# Patient Record
Sex: Male | Born: 1945 | Race: White | Hispanic: No | Smoking: Former smoker
Health system: Southern US, Community
[De-identification: ages and names within clinical notes are randomized; demographics above are authoritative.]

## PROBLEM LIST (undated history)

## (undated) DIAGNOSIS — I4891 Unspecified atrial fibrillation: Secondary | ICD-10-CM

## (undated) DIAGNOSIS — I10 Essential (primary) hypertension: Secondary | ICD-10-CM

## (undated) DIAGNOSIS — M2550 Pain in unspecified joint: Secondary | ICD-10-CM

## (undated) DIAGNOSIS — Z7901 Long term (current) use of anticoagulants: Secondary | ICD-10-CM

## (undated) DIAGNOSIS — K219 Gastro-esophageal reflux disease without esophagitis: Secondary | ICD-10-CM

## (undated) DIAGNOSIS — Z923 Personal history of irradiation: Secondary | ICD-10-CM

## (undated) DIAGNOSIS — E079 Disorder of thyroid, unspecified: Secondary | ICD-10-CM

## (undated) DIAGNOSIS — C801 Malignant (primary) neoplasm, unspecified: Secondary | ICD-10-CM

## (undated) HISTORY — PX: TONSILLECTOMY: SUR1361

## (undated) HISTORY — PX: OTHER SURGICAL HISTORY: SHX169

## (undated) HISTORY — DX: Disorder of thyroid, unspecified: E07.9

## (undated) HISTORY — DX: Long term (current) use of anticoagulants: Z79.01

## (undated) HISTORY — DX: Unspecified atrial fibrillation: I48.91

## (undated) HISTORY — DX: Gastro-esophageal reflux disease without esophagitis: K21.9

---
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
## 2001-03-10 ENCOUNTER — Ambulatory Visit (HOSPITAL_BASED_OUTPATIENT_CLINIC_OR_DEPARTMENT_OTHER): Admission: RE | Admit: 2001-03-10 | Discharge: 2001-03-10 | Payer: Self-pay

## 2002-03-08 HISTORY — PX: HEMORROIDECTOMY: SUR656

## 2003-06-27 ENCOUNTER — Encounter: Admission: RE | Admit: 2003-06-27 | Discharge: 2003-06-27 | Payer: Self-pay | Admitting: Family Medicine

## 2003-06-28 ENCOUNTER — Ambulatory Visit (HOSPITAL_COMMUNITY): Admission: RE | Admit: 2003-06-28 | Discharge: 2003-06-28 | Payer: Self-pay | Admitting: Urology

## 2011-06-23 ENCOUNTER — Ambulatory Visit (INDEPENDENT_AMBULATORY_CARE_PROVIDER_SITE_OTHER): Payer: Self-pay | Admitting: General Surgery

## 2013-05-02 DIAGNOSIS — L03119 Cellulitis of unspecified part of limb: Secondary | ICD-10-CM | POA: Diagnosis not present

## 2013-05-02 DIAGNOSIS — I10 Essential (primary) hypertension: Secondary | ICD-10-CM | POA: Diagnosis not present

## 2013-05-02 DIAGNOSIS — R609 Edema, unspecified: Secondary | ICD-10-CM | POA: Diagnosis not present

## 2013-05-02 DIAGNOSIS — M79609 Pain in unspecified limb: Secondary | ICD-10-CM | POA: Diagnosis not present

## 2013-05-02 DIAGNOSIS — I83893 Varicose veins of bilateral lower extremities with other complications: Secondary | ICD-10-CM | POA: Diagnosis not present

## 2013-05-02 DIAGNOSIS — Z79899 Other long term (current) drug therapy: Secondary | ICD-10-CM | POA: Diagnosis not present

## 2013-05-02 DIAGNOSIS — L02419 Cutaneous abscess of limb, unspecified: Secondary | ICD-10-CM | POA: Diagnosis not present

## 2013-05-16 DIAGNOSIS — I8310 Varicose veins of unspecified lower extremity with inflammation: Secondary | ICD-10-CM | POA: Diagnosis not present

## 2013-05-16 DIAGNOSIS — R0609 Other forms of dyspnea: Secondary | ICD-10-CM | POA: Diagnosis not present

## 2013-05-16 DIAGNOSIS — F3289 Other specified depressive episodes: Secondary | ICD-10-CM | POA: Diagnosis not present

## 2013-05-16 DIAGNOSIS — I10 Essential (primary) hypertension: Secondary | ICD-10-CM | POA: Diagnosis not present

## 2013-05-16 DIAGNOSIS — F329 Major depressive disorder, single episode, unspecified: Secondary | ICD-10-CM | POA: Diagnosis not present

## 2013-05-16 DIAGNOSIS — Z23 Encounter for immunization: Secondary | ICD-10-CM | POA: Diagnosis not present

## 2013-05-16 DIAGNOSIS — R0989 Other specified symptoms and signs involving the circulatory and respiratory systems: Secondary | ICD-10-CM | POA: Diagnosis not present

## 2013-06-25 DIAGNOSIS — J209 Acute bronchitis, unspecified: Secondary | ICD-10-CM | POA: Diagnosis not present

## 2013-06-27 DIAGNOSIS — A499 Bacterial infection, unspecified: Secondary | ICD-10-CM | POA: Diagnosis not present

## 2013-06-27 DIAGNOSIS — I8310 Varicose veins of unspecified lower extremity with inflammation: Secondary | ICD-10-CM | POA: Diagnosis not present

## 2013-06-27 DIAGNOSIS — B9689 Other specified bacterial agents as the cause of diseases classified elsewhere: Secondary | ICD-10-CM | POA: Diagnosis not present

## 2013-06-27 DIAGNOSIS — J209 Acute bronchitis, unspecified: Secondary | ICD-10-CM | POA: Diagnosis not present

## 2013-06-27 DIAGNOSIS — I10 Essential (primary) hypertension: Secondary | ICD-10-CM | POA: Diagnosis not present

## 2013-06-28 DIAGNOSIS — M7981 Nontraumatic hematoma of soft tissue: Secondary | ICD-10-CM | POA: Diagnosis not present

## 2013-06-28 DIAGNOSIS — R109 Unspecified abdominal pain: Secondary | ICD-10-CM | POA: Diagnosis not present

## 2013-06-28 DIAGNOSIS — K8020 Calculus of gallbladder without cholecystitis without obstruction: Secondary | ICD-10-CM | POA: Diagnosis not present

## 2013-06-28 DIAGNOSIS — S3981XA Other specified injuries of abdomen, initial encounter: Secondary | ICD-10-CM | POA: Diagnosis not present

## 2013-06-28 DIAGNOSIS — J209 Acute bronchitis, unspecified: Secondary | ICD-10-CM | POA: Diagnosis not present

## 2013-06-28 DIAGNOSIS — I10 Essential (primary) hypertension: Secondary | ICD-10-CM | POA: Diagnosis not present

## 2013-06-28 DIAGNOSIS — E876 Hypokalemia: Secondary | ICD-10-CM | POA: Diagnosis not present

## 2013-06-28 DIAGNOSIS — Z79899 Other long term (current) drug therapy: Secondary | ICD-10-CM | POA: Diagnosis not present

## 2013-06-28 DIAGNOSIS — S301XXA Contusion of abdominal wall, initial encounter: Secondary | ICD-10-CM | POA: Diagnosis not present

## 2013-07-03 DIAGNOSIS — A499 Bacterial infection, unspecified: Secondary | ICD-10-CM | POA: Diagnosis not present

## 2013-07-03 DIAGNOSIS — K5900 Constipation, unspecified: Secondary | ICD-10-CM | POA: Diagnosis not present

## 2013-07-03 DIAGNOSIS — B9689 Other specified bacterial agents as the cause of diseases classified elsewhere: Secondary | ICD-10-CM | POA: Diagnosis not present

## 2013-07-03 DIAGNOSIS — IMO0002 Reserved for concepts with insufficient information to code with codable children: Secondary | ICD-10-CM | POA: Diagnosis not present

## 2013-07-03 DIAGNOSIS — J209 Acute bronchitis, unspecified: Secondary | ICD-10-CM | POA: Diagnosis not present

## 2013-07-13 DIAGNOSIS — IMO0002 Reserved for concepts with insufficient information to code with codable children: Secondary | ICD-10-CM | POA: Diagnosis not present

## 2013-07-13 DIAGNOSIS — K5900 Constipation, unspecified: Secondary | ICD-10-CM | POA: Diagnosis not present

## 2013-07-13 DIAGNOSIS — I10 Essential (primary) hypertension: Secondary | ICD-10-CM | POA: Diagnosis not present

## 2013-07-13 DIAGNOSIS — J309 Allergic rhinitis, unspecified: Secondary | ICD-10-CM | POA: Diagnosis not present

## 2013-07-22 DIAGNOSIS — R059 Cough, unspecified: Secondary | ICD-10-CM | POA: Diagnosis not present

## 2013-07-22 DIAGNOSIS — J209 Acute bronchitis, unspecified: Secondary | ICD-10-CM | POA: Diagnosis not present

## 2013-11-21 DIAGNOSIS — I10 Essential (primary) hypertension: Secondary | ICD-10-CM | POA: Diagnosis not present

## 2013-11-21 DIAGNOSIS — Z780 Asymptomatic menopausal state: Secondary | ICD-10-CM | POA: Diagnosis not present

## 2013-11-21 DIAGNOSIS — Z125 Encounter for screening for malignant neoplasm of prostate: Secondary | ICD-10-CM | POA: Diagnosis not present

## 2013-11-21 DIAGNOSIS — I8310 Varicose veins of unspecified lower extremity with inflammation: Secondary | ICD-10-CM | POA: Diagnosis not present

## 2013-11-21 DIAGNOSIS — Z23 Encounter for immunization: Secondary | ICD-10-CM | POA: Diagnosis not present

## 2013-11-21 DIAGNOSIS — Z0000 Encounter for general adult medical examination without abnormal findings: Secondary | ICD-10-CM | POA: Diagnosis not present

## 2013-11-21 DIAGNOSIS — M949 Disorder of cartilage, unspecified: Secondary | ICD-10-CM | POA: Diagnosis not present

## 2013-11-21 DIAGNOSIS — J309 Allergic rhinitis, unspecified: Secondary | ICD-10-CM | POA: Diagnosis not present

## 2013-11-21 DIAGNOSIS — M899 Disorder of bone, unspecified: Secondary | ICD-10-CM | POA: Diagnosis not present

## 2014-06-04 DIAGNOSIS — Z23 Encounter for immunization: Secondary | ICD-10-CM | POA: Diagnosis not present

## 2014-06-04 DIAGNOSIS — F418 Other specified anxiety disorders: Secondary | ICD-10-CM | POA: Diagnosis not present

## 2014-06-04 DIAGNOSIS — I119 Hypertensive heart disease without heart failure: Secondary | ICD-10-CM | POA: Diagnosis not present

## 2014-06-04 DIAGNOSIS — J302 Other seasonal allergic rhinitis: Secondary | ICD-10-CM | POA: Diagnosis not present

## 2014-06-04 DIAGNOSIS — N401 Enlarged prostate with lower urinary tract symptoms: Secondary | ICD-10-CM | POA: Diagnosis not present

## 2014-07-11 DIAGNOSIS — Z125 Encounter for screening for malignant neoplasm of prostate: Secondary | ICD-10-CM | POA: Diagnosis not present

## 2014-07-11 DIAGNOSIS — R972 Elevated prostate specific antigen [PSA]: Secondary | ICD-10-CM | POA: Diagnosis not present

## 2014-07-11 DIAGNOSIS — I119 Hypertensive heart disease without heart failure: Secondary | ICD-10-CM | POA: Diagnosis not present

## 2014-07-11 DIAGNOSIS — E8881 Metabolic syndrome: Secondary | ICD-10-CM | POA: Diagnosis not present

## 2014-07-11 DIAGNOSIS — Z13220 Encounter for screening for lipoid disorders: Secondary | ICD-10-CM | POA: Diagnosis not present

## 2015-03-25 ENCOUNTER — Encounter (HOSPITAL_COMMUNITY): Payer: Self-pay | Admitting: Emergency Medicine

## 2015-03-25 ENCOUNTER — Emergency Department (HOSPITAL_COMMUNITY)
Admission: EM | Admit: 2015-03-25 | Discharge: 2015-03-25 | Disposition: A | Discharge status: Home / Self Care | Payer: Medicare Other | Attending: Emergency Medicine | Admitting: Emergency Medicine

## 2015-03-25 ENCOUNTER — Emergency Department (HOSPITAL_COMMUNITY): Payer: Medicare Other

## 2015-03-25 DIAGNOSIS — Z79899 Other long term (current) drug therapy: Secondary | ICD-10-CM | POA: Diagnosis not present

## 2015-03-25 DIAGNOSIS — Y998 Other external cause status: Secondary | ICD-10-CM | POA: Diagnosis not present

## 2015-03-25 DIAGNOSIS — W010XXA Fall on same level from slipping, tripping and stumbling without subsequent striking against object, initial encounter: Secondary | ICD-10-CM | POA: Insufficient documentation

## 2015-03-25 DIAGNOSIS — K625 Hemorrhage of anus and rectum: Secondary | ICD-10-CM | POA: Diagnosis not present

## 2015-03-25 DIAGNOSIS — K921 Melena: Secondary | ICD-10-CM

## 2015-03-25 DIAGNOSIS — Y9289 Other specified places as the place of occurrence of the external cause: Secondary | ICD-10-CM | POA: Insufficient documentation

## 2015-03-25 DIAGNOSIS — Y9389 Activity, other specified: Secondary | ICD-10-CM | POA: Diagnosis not present

## 2015-03-25 DIAGNOSIS — S29012A Strain of muscle and tendon of back wall of thorax, initial encounter: Secondary | ICD-10-CM | POA: Diagnosis not present

## 2015-03-25 DIAGNOSIS — M549 Dorsalgia, unspecified: Secondary | ICD-10-CM | POA: Diagnosis not present

## 2015-03-25 DIAGNOSIS — I10 Essential (primary) hypertension: Secondary | ICD-10-CM | POA: Insufficient documentation

## 2015-03-25 DIAGNOSIS — K649 Unspecified hemorrhoids: Secondary | ICD-10-CM | POA: Diagnosis not present

## 2015-03-25 DIAGNOSIS — S39012A Strain of muscle, fascia and tendon of lower back, initial encounter: Secondary | ICD-10-CM | POA: Diagnosis not present

## 2015-03-25 DIAGNOSIS — S3992XA Unspecified injury of lower back, initial encounter: Secondary | ICD-10-CM | POA: Diagnosis present

## 2015-03-25 DIAGNOSIS — K644 Residual hemorrhoidal skin tags: Secondary | ICD-10-CM | POA: Diagnosis not present

## 2015-03-25 DIAGNOSIS — R195 Other fecal abnormalities: Secondary | ICD-10-CM | POA: Diagnosis not present

## 2015-03-25 HISTORY — DX: Pain in unspecified joint: M25.50

## 2015-03-25 HISTORY — DX: Essential (primary) hypertension: I10

## 2015-03-25 LAB — CBC WITH DIFFERENTIAL/PLATELET
Basophils Absolute: 0 10*3/uL (ref 0.0–0.1)
Basophils Relative: 1 %
Eosinophils Absolute: 0.1 10*3/uL (ref 0.0–0.7)
Eosinophils Relative: 1 %
HCT: 47.8 % (ref 39.0–52.0)
Hemoglobin: 17 g/dL (ref 13.0–17.0)
Lymphocytes Relative: 20 %
Lymphs Abs: 1.2 10*3/uL (ref 0.7–4.0)
MCH: 29.6 pg (ref 26.0–34.0)
MCHC: 35.6 g/dL (ref 30.0–36.0)
MCV: 83.1 fL (ref 78.0–100.0)
Monocytes Absolute: 0.4 10*3/uL (ref 0.1–1.0)
Monocytes Relative: 6 %
Neutro Abs: 4.5 10*3/uL (ref 1.7–7.7)
Neutrophils Relative %: 72 %
Platelets: 200 10*3/uL (ref 150–400)
RBC: 5.75 MIL/uL (ref 4.22–5.81)
RDW: 13.5 % (ref 11.5–15.5)
WBC: 6.2 10*3/uL (ref 4.0–10.5)

## 2015-03-25 LAB — COMPREHENSIVE METABOLIC PANEL
ALT: 11 U/L — ABNORMAL LOW (ref 17–63)
AST: 21 U/L (ref 15–41)
Albumin: 4.8 g/dL (ref 3.5–5.0)
Alkaline Phosphatase: 36 U/L — ABNORMAL LOW (ref 38–126)
Anion gap: 12 (ref 5–15)
BUN: 15 mg/dL (ref 6–20)
CO2: 20 mmol/L — ABNORMAL LOW (ref 22–32)
Calcium: 9.3 mg/dL (ref 8.9–10.3)
Chloride: 108 mmol/L (ref 101–111)
Creatinine, Ser: 0.94 mg/dL (ref 0.61–1.24)
GFR calc Af Amer: >60 mL/min (ref >60)
GFR calc non Af Amer: >60 mL/min (ref >60)
Glucose, Bld: 124 mg/dL — ABNORMAL HIGH (ref 65–99)
Potassium: 3.9 mmol/L (ref 3.5–5.1)
Sodium: 140 mmol/L (ref 135–145)
Total Bilirubin: 0.9 mg/dL (ref 0.3–1.2)
Total Protein: 7.8 g/dL (ref 6.5–8.1)

## 2015-03-25 LAB — POC OCCULT BLOOD, ED: Fecal Occult Bld: NEGATIVE

## 2015-03-25 MED ORDER — HYDROCHLOROTHIAZIDE 25 MG PO TABS: 25.0000 mg | ORAL_TABLET | Freq: Every day | ORAL | Status: DC

## 2015-03-25 NOTE — ED Notes (Signed)
Pt a/o x 3, back non tender, abd non tender to palpation.  Pt states blood in stool tonight.

## 2015-03-25 NOTE — ED Notes (Signed)
MD at bedside. 

## 2015-03-25 NOTE — ED Provider Notes (Signed)
CSN: [id number]     Arrival date & time 03/25/15  [id number] History   First MD Initiated Contact with Patient 03/25/15 0502     Chief Complaint  Patient presents with  . Back Pain  . Abdominal Pain     (Consider location/radiation/quality/duration/timing/severity/associated sxs/prior Treatment) HPI Comments: 70yo M w/ h/o HTN who p/w abdominal pain, back pain, rectal bleeding. Patient states that he began having generalized abdominal pain yesterday. The pain has been intermittent. Nothing makes it better or worse. His pain returned this evening. He had a fall from standing approximately one week ago when he slipped on ice and fell onto his back. He has had back pain up and down his entire back since then and it wraps around to the sides. No saddle anesthesia, extremity numbness/weakness, or bowel/bladder incontinence. He reports constipation for the past week and notes that tonight he began having bloody stool which concerned him and is why he called EMS. He denies any history of GI bleeding. He does report a history of hemorrhoids. He endorses some nausea but denies any vomiting or fevers. No shortness of breath. No dark or tarry stools. Pt does take NSAIDS daily.  Patient is a 70 y.o. male presenting with back pain and abdominal pain. The history is provided by the patient.  Back Pain Associated symptoms: abdominal pain   Abdominal Pain   Past Medical History  Diagnosis Date  . Hypertension   . Joint pain    Past Surgical History  Procedure Laterality Date  . Right testical removed     History reviewed. No pertinent family history. Social History  Substance Use Topics  . Smoking status: Never Smoker   . Smokeless tobacco: None  . Alcohol Use: Yes    Review of Systems  Gastrointestinal: Positive for abdominal pain.  Musculoskeletal: Positive for back pain.   10 Systems reviewed and are negative for acute change except as noted in the HPI.    Allergies  Review of patient's  allergies indicates no known allergies.  Home Medications   Prior to Admission medications   Medication Sig Start Date End Date Taking? Authorizing Provider  acidophilus (RISAQUAD) CAPS capsule Take 1 capsule by mouth daily.   Yes Historical Provider, MD  hydrochlorothiazide (HYDRODIURIL) 25 MG tablet Take 1 tablet (25 mg total) by mouth daily. 03/25/15   Sharlett Iles, MD  ibuprofen (ADVIL,MOTRIN) 200 MG tablet Take 400-800 mg by mouth every 6 (six) hours as needed for moderate pain.   Yes Historical Provider, MD  Multiple Vitamin (MULTIVITAMIN WITH MINERALS) TABS tablet Take 1 tablet by mouth daily.   Yes Historical Provider, MD  OVER THE COUNTER MEDICATION Take 1 capsule by mouth daily.   Yes Historical Provider, MD   BP 182/102 mmHg  Pulse 81  Temp(Src) 97.9 F (36.6 C) (Oral)  Resp 20  Ht 5\' 10"  (1.778 m)  Wt 250 lb (113.399 kg)  BMI 35.87 kg/m2  SpO2 97% Physical Exam  Constitutional: He is oriented to person, place, and time. He appears well-developed and well-nourished. No distress.  HENT:  Head: Normocephalic and atraumatic.  Moist mucous membranes  Eyes: Conjunctivae are normal. Pupils are equal, round, and reactive to light.  Neck: Neck supple.  Cardiovascular: Normal rate, regular rhythm and normal heart sounds.   No murmur heard. Pulmonary/Chest: Effort normal and breath sounds normal.  Abdominal: Soft. Bowel sounds are normal. He exhibits no distension. There is no tenderness.  Genitourinary: Rectum normal.  External, non thrombosed hemorrhoids; no  blood in rectal vault  Musculoskeletal:  No midline spinal tenderness or step off, 5/5 strength and normal sensation x all 4 ext  Neurological: He is alert and oriented to person, place, and time.  Fluent speech, normal gait  Skin: Skin is warm and dry.  Psychiatric: He has a normal mood and affect. Judgment normal.  Nursing note and vitals reviewed.  Chaperone was present during exam.  ED Course  Procedures  (including critical care time) Labs Review Labs Reviewed  COMPREHENSIVE METABOLIC PANEL - Abnormal; Notable for the following:    CO2 20 (*)    Glucose, Bld 124 (*)    ALT 11 (*)    Alkaline Phosphatase 36 (*)    All other components within normal limits  CBC WITH DIFFERENTIAL/PLATELET  POC OCCULT BLOOD, ED  POC OCCULT BLOOD, ED    Imaging Review Dg Thoracic Spine 2 View  03/25/2015  CLINICAL DATA:  70 year old male with fall and mid back pain. EXAM: THORACIC SPINE 2 VIEWS ; LUMBAR SPINE - 2-3 VIEW COMPARISON:  CT dated 06/28/2013 FINDINGS: No acute fracture or subluxation of the thoracic or lumbar spine. The bones are osteopenic. Multilevel degenerative changes with osteophyte. The soft tissues appear unremarkable. IMPRESSION: No acute/ traumatic thoracic or lumbar spine pathology. Electronically Signed   By: Anner Crete M.D.   On: 03/25/2015 06:15   Dg Lumbar Spine 2-3 Views  03/25/2015  CLINICAL DATA:  70 year old male with fall and mid back pain. EXAM: THORACIC SPINE 2 VIEWS ; LUMBAR SPINE - 2-3 VIEW COMPARISON:  CT dated 06/28/2013 FINDINGS: No acute fracture or subluxation of the thoracic or lumbar spine. The bones are osteopenic. Multilevel degenerative changes with osteophyte. The soft tissues appear unremarkable. IMPRESSION: No acute/ traumatic thoracic or lumbar spine pathology. Electronically Signed   By: Anner Crete M.D.   On: 03/25/2015 06:15   I have personally reviewed and evaluated these lab results as part of my medical decision-making.   EKG Interpretation None      MDM   Final diagnoses:  Hemorrhoids, unspecified hemorrhoid type  Bloody stools  Essential hypertension  Back strain, initial encounter   Patient presents with 1 week of back pain after a fall from standing as well as 1 day of intermittent abdominal pain and an episode of bloody stool that began this evening in the setting of constipation. Patient well-appearing at presentation. Vital signs  notable for hypertension; patient has been out of blood pressure medication for a few months. No abdominal tenderness on exam. External hemorrhoids noted, no blood in rectal vault. Later pt had BM in ED; brown stool noted w/ blood on outside, no grossly bloody stool. Obtained above labs which showed normal H/H. plain films of the spine were unremarkable and given that patient is ambulatory without any neurologic deficits I suspect muscular strain.  Regarding bleeding, the patient does have hemorrhoids on exam and reports a history of hemorrhoids. Given that his constipation led up to the bleeding tonight, I suspect bleeding from hemorrhoids. Patient is well-appearing and has been ambulatory here therefore I feel he is safe for discharge home. I discussed supportive care instructions including over-the-counter medications to treat constipation. Also provided w/ refill on HTN medication. I extensively reviewed return precautions including any worsening bleeding. Patient assured me that he will follow-up with PCP. Patient discharged in satisfactory condition.  Sharlett Iles, MD 03/25/15 [phone]

## 2015-03-25 NOTE — ED Notes (Signed)
Bed: WA02 Expected date:  Expected time:  Means of arrival:  Comments: Fall, hematuria, back pain

## 2015-03-25 NOTE — ED Notes (Signed)
Patient is resting comfortably. Waiting for MD

## 2015-03-25 NOTE — Discharge Instructions (Signed)
Take colace 1-2 capsules daily. Take miralax 1 capful 1 to 3 times daily as needed for soft stools. Take senna 1 tablet at night as needed for severe constipation.  Discontinue ALL ibuprofen, aleve, advil, or motrin use. Use tylenol only for pain.

## 2015-03-25 NOTE — ED Notes (Signed)
EMS states pain called for abd and back pain but pain free upon arrival of EMS.  Called EMS b/c he was scared when he fell Sunday before last and thinks he hurt something.

## 2015-04-02 ENCOUNTER — Other Ambulatory Visit: Payer: Self-pay | Admitting: Family Medicine

## 2015-04-02 ENCOUNTER — Ambulatory Visit
Admission: RE | Admit: 2015-04-02 | Discharge: 2015-04-02 | Disposition: A | Discharge status: Home / Self Care | Payer: Medicare Other | Source: Ambulatory Visit | Attending: Family Medicine | Admitting: Family Medicine

## 2015-04-02 DIAGNOSIS — R1084 Generalized abdominal pain: Secondary | ICD-10-CM

## 2015-04-02 DIAGNOSIS — I10 Essential (primary) hypertension: Secondary | ICD-10-CM | POA: Diagnosis not present

## 2015-04-02 MED ORDER — IOPAMIDOL (ISOVUE-300) INJECTION 61%
100.0000 mL | Freq: Once | INTRAVENOUS | Status: AC | PRN
  Administered 2015-04-02: 100 mL via INTRAVENOUS

## 2015-07-22 ENCOUNTER — Telehealth: Payer: Self-pay | Admitting: Endocrinology

## 2015-07-22 NOTE — Telephone Encounter (Signed)
Error

## 2015-07-30 DIAGNOSIS — I10 Essential (primary) hypertension: Secondary | ICD-10-CM | POA: Diagnosis not present

## 2015-07-30 DIAGNOSIS — S83241A Other tear of medial meniscus, current injury, right knee, initial encounter: Secondary | ICD-10-CM | POA: Diagnosis not present

## 2015-07-30 DIAGNOSIS — Z23 Encounter for immunization: Secondary | ICD-10-CM | POA: Diagnosis not present

## 2015-08-11 DIAGNOSIS — M25561 Pain in right knee: Secondary | ICD-10-CM | POA: Diagnosis not present

## 2015-09-12 DIAGNOSIS — M25561 Pain in right knee: Secondary | ICD-10-CM | POA: Diagnosis not present

## 2016-02-03 DIAGNOSIS — Z23 Encounter for immunization: Secondary | ICD-10-CM | POA: Diagnosis not present

## 2016-02-09 DIAGNOSIS — M1712 Unilateral primary osteoarthritis, left knee: Secondary | ICD-10-CM | POA: Diagnosis not present

## 2016-02-09 DIAGNOSIS — M67911 Unspecified disorder of synovium and tendon, right shoulder: Secondary | ICD-10-CM | POA: Diagnosis not present

## 2016-02-09 DIAGNOSIS — M1711 Unilateral primary osteoarthritis, right knee: Secondary | ICD-10-CM | POA: Diagnosis not present

## 2016-03-10 DIAGNOSIS — M1711 Unilateral primary osteoarthritis, right knee: Secondary | ICD-10-CM | POA: Diagnosis not present

## 2016-03-10 DIAGNOSIS — M67911 Unspecified disorder of synovium and tendon, right shoulder: Secondary | ICD-10-CM | POA: Diagnosis not present

## 2016-03-10 DIAGNOSIS — M1712 Unilateral primary osteoarthritis, left knee: Secondary | ICD-10-CM | POA: Diagnosis not present

## 2016-03-17 DIAGNOSIS — M1711 Unilateral primary osteoarthritis, right knee: Secondary | ICD-10-CM | POA: Diagnosis not present

## 2016-03-17 DIAGNOSIS — M1712 Unilateral primary osteoarthritis, left knee: Secondary | ICD-10-CM | POA: Diagnosis not present

## 2016-03-31 DIAGNOSIS — M1712 Unilateral primary osteoarthritis, left knee: Secondary | ICD-10-CM | POA: Diagnosis not present

## 2016-03-31 DIAGNOSIS — M1711 Unilateral primary osteoarthritis, right knee: Secondary | ICD-10-CM | POA: Diagnosis not present

## 2016-04-02 DIAGNOSIS — I700 Atherosclerosis of aorta: Secondary | ICD-10-CM | POA: Diagnosis not present

## 2016-04-02 DIAGNOSIS — R05 Cough: Secondary | ICD-10-CM | POA: Diagnosis not present

## 2016-04-02 DIAGNOSIS — R0600 Dyspnea, unspecified: Secondary | ICD-10-CM | POA: Diagnosis not present

## 2016-04-02 DIAGNOSIS — J40 Bronchitis, not specified as acute or chronic: Secondary | ICD-10-CM | POA: Diagnosis not present

## 2016-04-02 DIAGNOSIS — J9811 Atelectasis: Secondary | ICD-10-CM | POA: Diagnosis not present

## 2016-04-07 DIAGNOSIS — M1711 Unilateral primary osteoarthritis, right knee: Secondary | ICD-10-CM | POA: Diagnosis not present

## 2016-04-07 DIAGNOSIS — M1712 Unilateral primary osteoarthritis, left knee: Secondary | ICD-10-CM | POA: Diagnosis not present

## 2016-04-12 DIAGNOSIS — Z125 Encounter for screening for malignant neoplasm of prostate: Secondary | ICD-10-CM | POA: Diagnosis not present

## 2016-04-12 DIAGNOSIS — Z23 Encounter for immunization: Secondary | ICD-10-CM | POA: Diagnosis not present

## 2016-04-12 DIAGNOSIS — R351 Nocturia: Secondary | ICD-10-CM | POA: Diagnosis not present

## 2016-04-12 DIAGNOSIS — R319 Hematuria, unspecified: Secondary | ICD-10-CM | POA: Diagnosis not present

## 2016-04-12 DIAGNOSIS — Z6841 Body Mass Index (BMI) 40.0 and over, adult: Secondary | ICD-10-CM | POA: Diagnosis not present

## 2016-04-12 DIAGNOSIS — R002 Palpitations: Secondary | ICD-10-CM | POA: Diagnosis not present

## 2016-04-12 DIAGNOSIS — I4892 Unspecified atrial flutter: Secondary | ICD-10-CM | POA: Diagnosis not present

## 2016-04-12 DIAGNOSIS — N401 Enlarged prostate with lower urinary tract symptoms: Secondary | ICD-10-CM | POA: Diagnosis not present

## 2016-04-12 DIAGNOSIS — I10 Essential (primary) hypertension: Secondary | ICD-10-CM | POA: Diagnosis not present

## 2016-04-12 DIAGNOSIS — Z1159 Encounter for screening for other viral diseases: Secondary | ICD-10-CM | POA: Diagnosis not present

## 2016-04-14 DIAGNOSIS — M1711 Unilateral primary osteoarthritis, right knee: Secondary | ICD-10-CM | POA: Diagnosis not present

## 2016-04-14 DIAGNOSIS — M1712 Unilateral primary osteoarthritis, left knee: Secondary | ICD-10-CM | POA: Diagnosis not present

## 2016-04-15 DIAGNOSIS — R0609 Other forms of dyspnea: Secondary | ICD-10-CM | POA: Diagnosis not present

## 2016-04-15 DIAGNOSIS — G4733 Obstructive sleep apnea (adult) (pediatric): Secondary | ICD-10-CM | POA: Diagnosis not present

## 2016-04-15 DIAGNOSIS — I10 Essential (primary) hypertension: Secondary | ICD-10-CM | POA: Diagnosis not present

## 2016-04-15 DIAGNOSIS — R5382 Chronic fatigue, unspecified: Secondary | ICD-10-CM | POA: Diagnosis not present

## 2016-04-15 DIAGNOSIS — Z6841 Body Mass Index (BMI) 40.0 and over, adult: Secondary | ICD-10-CM | POA: Diagnosis not present

## 2016-04-15 DIAGNOSIS — N400 Enlarged prostate without lower urinary tract symptoms: Secondary | ICD-10-CM | POA: Diagnosis not present

## 2016-04-15 DIAGNOSIS — I4891 Unspecified atrial fibrillation: Secondary | ICD-10-CM | POA: Diagnosis not present

## 2016-04-21 DIAGNOSIS — I4891 Unspecified atrial fibrillation: Secondary | ICD-10-CM | POA: Diagnosis not present

## 2016-05-13 DIAGNOSIS — I4891 Unspecified atrial fibrillation: Secondary | ICD-10-CM | POA: Diagnosis not present

## 2016-05-13 DIAGNOSIS — R351 Nocturia: Secondary | ICD-10-CM | POA: Diagnosis not present

## 2016-05-13 DIAGNOSIS — R5382 Chronic fatigue, unspecified: Secondary | ICD-10-CM | POA: Diagnosis not present

## 2016-05-13 DIAGNOSIS — N401 Enlarged prostate with lower urinary tract symptoms: Secondary | ICD-10-CM | POA: Diagnosis not present

## 2016-05-13 DIAGNOSIS — I481 Persistent atrial fibrillation: Secondary | ICD-10-CM | POA: Diagnosis not present

## 2016-05-13 DIAGNOSIS — I10 Essential (primary) hypertension: Secondary | ICD-10-CM | POA: Diagnosis not present

## 2016-05-13 DIAGNOSIS — Z6841 Body Mass Index (BMI) 40.0 and over, adult: Secondary | ICD-10-CM | POA: Diagnosis not present

## 2016-05-24 DIAGNOSIS — I440 Atrioventricular block, first degree: Secondary | ICD-10-CM | POA: Diagnosis not present

## 2016-05-24 DIAGNOSIS — R0602 Shortness of breath: Secondary | ICD-10-CM | POA: Diagnosis not present

## 2016-05-24 DIAGNOSIS — Z87891 Personal history of nicotine dependence: Secondary | ICD-10-CM | POA: Diagnosis not present

## 2016-05-24 DIAGNOSIS — I10 Essential (primary) hypertension: Secondary | ICD-10-CM | POA: Diagnosis not present

## 2016-05-24 DIAGNOSIS — Z9889 Other specified postprocedural states: Secondary | ICD-10-CM | POA: Diagnosis not present

## 2016-05-24 DIAGNOSIS — I4891 Unspecified atrial fibrillation: Secondary | ICD-10-CM | POA: Diagnosis not present

## 2016-05-24 DIAGNOSIS — Z7901 Long term (current) use of anticoagulants: Secondary | ICD-10-CM | POA: Diagnosis not present

## 2016-05-24 DIAGNOSIS — I252 Old myocardial infarction: Secondary | ICD-10-CM | POA: Diagnosis not present

## 2016-05-24 DIAGNOSIS — I4892 Unspecified atrial flutter: Secondary | ICD-10-CM | POA: Diagnosis not present

## 2016-05-24 DIAGNOSIS — Z79899 Other long term (current) drug therapy: Secondary | ICD-10-CM | POA: Diagnosis not present

## 2016-06-03 DIAGNOSIS — I4891 Unspecified atrial fibrillation: Secondary | ICD-10-CM | POA: Diagnosis not present

## 2016-06-10 DIAGNOSIS — N400 Enlarged prostate without lower urinary tract symptoms: Secondary | ICD-10-CM | POA: Diagnosis not present

## 2016-06-10 DIAGNOSIS — R5382 Chronic fatigue, unspecified: Secondary | ICD-10-CM | POA: Diagnosis not present

## 2016-06-10 DIAGNOSIS — Z5181 Encounter for therapeutic drug level monitoring: Secondary | ICD-10-CM | POA: Diagnosis not present

## 2016-06-10 DIAGNOSIS — I4891 Unspecified atrial fibrillation: Secondary | ICD-10-CM | POA: Diagnosis not present

## 2016-06-10 DIAGNOSIS — I481 Persistent atrial fibrillation: Secondary | ICD-10-CM | POA: Diagnosis not present

## 2016-06-10 DIAGNOSIS — Z6841 Body Mass Index (BMI) 40.0 and over, adult: Secondary | ICD-10-CM | POA: Diagnosis not present

## 2016-06-10 DIAGNOSIS — I10 Essential (primary) hypertension: Secondary | ICD-10-CM | POA: Diagnosis not present

## 2016-06-10 DIAGNOSIS — Z79899 Other long term (current) drug therapy: Secondary | ICD-10-CM | POA: Diagnosis not present

## 2016-06-15 DIAGNOSIS — Z136 Encounter for screening for cardiovascular disorders: Secondary | ICD-10-CM | POA: Diagnosis not present

## 2016-06-15 DIAGNOSIS — K219 Gastro-esophageal reflux disease without esophagitis: Secondary | ICD-10-CM | POA: Diagnosis not present

## 2016-06-17 DIAGNOSIS — I4892 Unspecified atrial flutter: Secondary | ICD-10-CM | POA: Diagnosis not present

## 2016-06-17 DIAGNOSIS — R002 Palpitations: Secondary | ICD-10-CM | POA: Diagnosis not present

## 2016-06-17 DIAGNOSIS — I4891 Unspecified atrial fibrillation: Secondary | ICD-10-CM | POA: Diagnosis not present

## 2016-06-17 DIAGNOSIS — R0602 Shortness of breath: Secondary | ICD-10-CM | POA: Diagnosis not present

## 2016-06-17 DIAGNOSIS — I481 Persistent atrial fibrillation: Secondary | ICD-10-CM | POA: Diagnosis not present

## 2016-06-25 DIAGNOSIS — I481 Persistent atrial fibrillation: Secondary | ICD-10-CM | POA: Diagnosis not present

## 2016-06-25 DIAGNOSIS — Z136 Encounter for screening for cardiovascular disorders: Secondary | ICD-10-CM | POA: Diagnosis not present

## 2016-06-25 DIAGNOSIS — Z87891 Personal history of nicotine dependence: Secondary | ICD-10-CM | POA: Diagnosis not present

## 2016-06-25 DIAGNOSIS — I77811 Abdominal aortic ectasia: Secondary | ICD-10-CM | POA: Diagnosis not present

## 2016-06-28 DIAGNOSIS — I481 Persistent atrial fibrillation: Secondary | ICD-10-CM | POA: Diagnosis not present

## 2016-06-28 DIAGNOSIS — R9431 Abnormal electrocardiogram [ECG] [EKG]: Secondary | ICD-10-CM | POA: Diagnosis not present

## 2016-06-28 DIAGNOSIS — I252 Old myocardial infarction: Secondary | ICD-10-CM | POA: Diagnosis not present

## 2016-06-28 DIAGNOSIS — I4891 Unspecified atrial fibrillation: Secondary | ICD-10-CM | POA: Diagnosis not present

## 2016-07-13 DIAGNOSIS — Z6841 Body Mass Index (BMI) 40.0 and over, adult: Secondary | ICD-10-CM | POA: Diagnosis not present

## 2016-07-13 DIAGNOSIS — E669 Obesity, unspecified: Secondary | ICD-10-CM | POA: Diagnosis not present

## 2016-07-23 DIAGNOSIS — I4891 Unspecified atrial fibrillation: Secondary | ICD-10-CM | POA: Diagnosis not present

## 2016-07-23 DIAGNOSIS — R351 Nocturia: Secondary | ICD-10-CM | POA: Diagnosis not present

## 2016-07-23 DIAGNOSIS — I10 Essential (primary) hypertension: Secondary | ICD-10-CM | POA: Diagnosis not present

## 2016-07-23 DIAGNOSIS — N401 Enlarged prostate with lower urinary tract symptoms: Secondary | ICD-10-CM | POA: Diagnosis not present

## 2016-07-23 DIAGNOSIS — I481 Persistent atrial fibrillation: Secondary | ICD-10-CM | POA: Diagnosis not present

## 2016-07-28 DIAGNOSIS — I77819 Aortic ectasia, unspecified site: Secondary | ICD-10-CM | POA: Diagnosis not present

## 2016-07-28 DIAGNOSIS — I10 Essential (primary) hypertension: Secondary | ICD-10-CM | POA: Diagnosis not present

## 2016-07-28 DIAGNOSIS — I481 Persistent atrial fibrillation: Secondary | ICD-10-CM | POA: Diagnosis not present

## 2016-07-28 DIAGNOSIS — I483 Typical atrial flutter: Secondary | ICD-10-CM | POA: Diagnosis not present

## 2016-07-28 DIAGNOSIS — R0609 Other forms of dyspnea: Secondary | ICD-10-CM | POA: Diagnosis not present

## 2016-08-04 DIAGNOSIS — R0609 Other forms of dyspnea: Secondary | ICD-10-CM | POA: Diagnosis not present

## 2016-08-04 DIAGNOSIS — E876 Hypokalemia: Secondary | ICD-10-CM | POA: Diagnosis not present

## 2016-08-04 DIAGNOSIS — I481 Persistent atrial fibrillation: Secondary | ICD-10-CM | POA: Diagnosis not present

## 2016-08-04 DIAGNOSIS — I10 Essential (primary) hypertension: Secondary | ICD-10-CM | POA: Diagnosis not present

## 2016-08-11 DIAGNOSIS — Z9889 Other specified postprocedural states: Secondary | ICD-10-CM | POA: Diagnosis not present

## 2016-08-11 DIAGNOSIS — Z7901 Long term (current) use of anticoagulants: Secondary | ICD-10-CM | POA: Diagnosis not present

## 2016-08-11 DIAGNOSIS — K219 Gastro-esophageal reflux disease without esophagitis: Secondary | ICD-10-CM | POA: Diagnosis not present

## 2016-08-11 DIAGNOSIS — Z87891 Personal history of nicotine dependence: Secondary | ICD-10-CM | POA: Diagnosis not present

## 2016-08-11 DIAGNOSIS — I4891 Unspecified atrial fibrillation: Secondary | ICD-10-CM | POA: Diagnosis not present

## 2016-08-11 DIAGNOSIS — R0602 Shortness of breath: Secondary | ICD-10-CM | POA: Diagnosis not present

## 2016-08-11 DIAGNOSIS — I10 Essential (primary) hypertension: Secondary | ICD-10-CM | POA: Diagnosis not present

## 2016-08-11 DIAGNOSIS — Z79899 Other long term (current) drug therapy: Secondary | ICD-10-CM | POA: Diagnosis not present

## 2016-08-11 DIAGNOSIS — I481 Persistent atrial fibrillation: Secondary | ICD-10-CM | POA: Diagnosis not present

## 2016-08-11 DIAGNOSIS — I4892 Unspecified atrial flutter: Secondary | ICD-10-CM | POA: Diagnosis not present

## 2016-09-15 DIAGNOSIS — R0609 Other forms of dyspnea: Secondary | ICD-10-CM | POA: Diagnosis not present

## 2016-09-15 DIAGNOSIS — I5031 Acute diastolic (congestive) heart failure: Secondary | ICD-10-CM | POA: Diagnosis not present

## 2016-09-15 DIAGNOSIS — Z6841 Body Mass Index (BMI) 40.0 and over, adult: Secondary | ICD-10-CM | POA: Diagnosis not present

## 2016-09-15 DIAGNOSIS — I481 Persistent atrial fibrillation: Secondary | ICD-10-CM | POA: Diagnosis not present

## 2016-09-15 DIAGNOSIS — I4891 Unspecified atrial fibrillation: Secondary | ICD-10-CM | POA: Diagnosis not present

## 2016-09-15 DIAGNOSIS — R5382 Chronic fatigue, unspecified: Secondary | ICD-10-CM | POA: Diagnosis not present

## 2016-09-15 DIAGNOSIS — R002 Palpitations: Secondary | ICD-10-CM | POA: Diagnosis not present

## 2016-11-12 DIAGNOSIS — I482 Chronic atrial fibrillation: Secondary | ICD-10-CM | POA: Diagnosis not present

## 2016-11-12 DIAGNOSIS — R42 Dizziness and giddiness: Secondary | ICD-10-CM | POA: Diagnosis not present

## 2016-11-16 DIAGNOSIS — Z7901 Long term (current) use of anticoagulants: Secondary | ICD-10-CM | POA: Diagnosis not present

## 2016-11-16 DIAGNOSIS — I77811 Abdominal aortic ectasia: Secondary | ICD-10-CM | POA: Diagnosis not present

## 2016-11-16 DIAGNOSIS — I481 Persistent atrial fibrillation: Secondary | ICD-10-CM | POA: Diagnosis not present

## 2016-11-16 DIAGNOSIS — K219 Gastro-esophageal reflux disease without esophagitis: Secondary | ICD-10-CM | POA: Diagnosis not present

## 2016-11-16 DIAGNOSIS — Z9889 Other specified postprocedural states: Secondary | ICD-10-CM | POA: Diagnosis not present

## 2016-11-16 DIAGNOSIS — I10 Essential (primary) hypertension: Secondary | ICD-10-CM | POA: Diagnosis not present

## 2016-12-14 DIAGNOSIS — K219 Gastro-esophageal reflux disease without esophagitis: Secondary | ICD-10-CM | POA: Diagnosis not present

## 2016-12-14 DIAGNOSIS — I480 Paroxysmal atrial fibrillation: Secondary | ICD-10-CM | POA: Diagnosis not present

## 2016-12-14 DIAGNOSIS — I10 Essential (primary) hypertension: Secondary | ICD-10-CM | POA: Diagnosis not present

## 2016-12-15 DIAGNOSIS — I482 Chronic atrial fibrillation: Secondary | ICD-10-CM | POA: Diagnosis not present

## 2016-12-15 DIAGNOSIS — Z23 Encounter for immunization: Secondary | ICD-10-CM | POA: Diagnosis not present

## 2016-12-15 DIAGNOSIS — I10 Essential (primary) hypertension: Secondary | ICD-10-CM | POA: Diagnosis not present

## 2017-01-24 DIAGNOSIS — I481 Persistent atrial fibrillation: Secondary | ICD-10-CM | POA: Diagnosis not present

## 2017-02-16 DIAGNOSIS — Z79899 Other long term (current) drug therapy: Secondary | ICD-10-CM | POA: Diagnosis not present

## 2017-02-16 DIAGNOSIS — I77819 Aortic ectasia, unspecified site: Secondary | ICD-10-CM | POA: Diagnosis not present

## 2017-02-16 DIAGNOSIS — I480 Paroxysmal atrial fibrillation: Secondary | ICD-10-CM | POA: Diagnosis not present

## 2017-02-16 DIAGNOSIS — R5382 Chronic fatigue, unspecified: Secondary | ICD-10-CM | POA: Diagnosis not present

## 2017-02-16 DIAGNOSIS — R0609 Other forms of dyspnea: Secondary | ICD-10-CM | POA: Diagnosis not present

## 2017-02-16 DIAGNOSIS — I10 Essential (primary) hypertension: Secondary | ICD-10-CM | POA: Diagnosis not present

## 2017-02-17 DIAGNOSIS — Z1331 Encounter for screening for depression: Secondary | ICD-10-CM | POA: Diagnosis not present

## 2017-02-17 DIAGNOSIS — Z6836 Body mass index (BMI) 36.0-36.9, adult: Secondary | ICD-10-CM | POA: Diagnosis not present

## 2017-02-17 DIAGNOSIS — M170 Bilateral primary osteoarthritis of knee: Secondary | ICD-10-CM | POA: Diagnosis not present

## 2017-02-17 DIAGNOSIS — Z9181 History of falling: Secondary | ICD-10-CM | POA: Diagnosis not present

## 2017-02-17 DIAGNOSIS — I4891 Unspecified atrial fibrillation: Secondary | ICD-10-CM | POA: Diagnosis not present

## 2017-05-18 DIAGNOSIS — Z1331 Encounter for screening for depression: Secondary | ICD-10-CM | POA: Diagnosis not present

## 2017-05-18 DIAGNOSIS — I4891 Unspecified atrial fibrillation: Secondary | ICD-10-CM | POA: Diagnosis not present

## 2017-05-18 DIAGNOSIS — M170 Bilateral primary osteoarthritis of knee: Secondary | ICD-10-CM | POA: Diagnosis not present

## 2017-05-18 DIAGNOSIS — Z79899 Other long term (current) drug therapy: Secondary | ICD-10-CM | POA: Diagnosis not present

## 2017-05-18 DIAGNOSIS — Z6837 Body mass index (BMI) 37.0-37.9, adult: Secondary | ICD-10-CM | POA: Diagnosis not present

## 2017-05-18 DIAGNOSIS — F329 Major depressive disorder, single episode, unspecified: Secondary | ICD-10-CM | POA: Diagnosis not present

## 2017-05-18 DIAGNOSIS — Z125 Encounter for screening for malignant neoplasm of prostate: Secondary | ICD-10-CM | POA: Diagnosis not present

## 2017-07-18 DIAGNOSIS — R972 Elevated prostate specific antigen [PSA]: Secondary | ICD-10-CM | POA: Diagnosis not present

## 2017-08-24 DIAGNOSIS — I480 Paroxysmal atrial fibrillation: Secondary | ICD-10-CM | POA: Diagnosis not present

## 2017-08-24 DIAGNOSIS — Z79899 Other long term (current) drug therapy: Secondary | ICD-10-CM | POA: Diagnosis not present

## 2017-11-08 DIAGNOSIS — Z23 Encounter for immunization: Secondary | ICD-10-CM | POA: Diagnosis not present

## 2017-11-22 DIAGNOSIS — I4891 Unspecified atrial fibrillation: Secondary | ICD-10-CM | POA: Diagnosis not present

## 2017-11-22 DIAGNOSIS — N400 Enlarged prostate without lower urinary tract symptoms: Secondary | ICD-10-CM | POA: Diagnosis not present

## 2017-11-22 DIAGNOSIS — M170 Bilateral primary osteoarthritis of knee: Secondary | ICD-10-CM | POA: Diagnosis not present

## 2017-11-22 DIAGNOSIS — K219 Gastro-esophageal reflux disease without esophagitis: Secondary | ICD-10-CM | POA: Diagnosis not present

## 2017-11-22 DIAGNOSIS — Z1339 Encounter for screening examination for other mental health and behavioral disorders: Secondary | ICD-10-CM | POA: Diagnosis not present

## 2017-11-22 DIAGNOSIS — Z79899 Other long term (current) drug therapy: Secondary | ICD-10-CM | POA: Diagnosis not present

## 2017-12-26 DIAGNOSIS — I77819 Aortic ectasia, unspecified site: Secondary | ICD-10-CM | POA: Diagnosis not present

## 2017-12-26 DIAGNOSIS — Z7901 Long term (current) use of anticoagulants: Secondary | ICD-10-CM | POA: Diagnosis not present

## 2017-12-26 DIAGNOSIS — I4819 Other persistent atrial fibrillation: Secondary | ICD-10-CM | POA: Diagnosis not present

## 2017-12-26 DIAGNOSIS — I10 Essential (primary) hypertension: Secondary | ICD-10-CM | POA: Diagnosis not present

## 2018-02-13 DIAGNOSIS — J019 Acute sinusitis, unspecified: Secondary | ICD-10-CM | POA: Diagnosis not present

## 2018-02-13 DIAGNOSIS — Z6838 Body mass index (BMI) 38.0-38.9, adult: Secondary | ICD-10-CM | POA: Diagnosis not present

## 2018-05-23 DIAGNOSIS — Z9181 History of falling: Secondary | ICD-10-CM | POA: Diagnosis not present

## 2018-05-23 DIAGNOSIS — N400 Enlarged prostate without lower urinary tract symptoms: Secondary | ICD-10-CM | POA: Diagnosis not present

## 2018-05-23 DIAGNOSIS — Z1331 Encounter for screening for depression: Secondary | ICD-10-CM | POA: Diagnosis not present

## 2018-05-23 DIAGNOSIS — K219 Gastro-esophageal reflux disease without esophagitis: Secondary | ICD-10-CM | POA: Diagnosis not present

## 2018-05-23 DIAGNOSIS — Z125 Encounter for screening for malignant neoplasm of prostate: Secondary | ICD-10-CM | POA: Diagnosis not present

## 2018-05-23 DIAGNOSIS — I4891 Unspecified atrial fibrillation: Secondary | ICD-10-CM | POA: Diagnosis not present

## 2018-05-23 DIAGNOSIS — M170 Bilateral primary osteoarthritis of knee: Secondary | ICD-10-CM | POA: Diagnosis not present

## 2018-05-23 DIAGNOSIS — Z79899 Other long term (current) drug therapy: Secondary | ICD-10-CM | POA: Diagnosis not present

## 2018-05-31 DIAGNOSIS — M170 Bilateral primary osteoarthritis of knee: Secondary | ICD-10-CM | POA: Diagnosis not present

## 2018-06-26 DIAGNOSIS — Z7901 Long term (current) use of anticoagulants: Secondary | ICD-10-CM | POA: Diagnosis not present

## 2018-06-26 DIAGNOSIS — I484 Atypical atrial flutter: Secondary | ICD-10-CM | POA: Diagnosis not present

## 2018-06-26 DIAGNOSIS — I77819 Aortic ectasia, unspecified site: Secondary | ICD-10-CM | POA: Diagnosis not present

## 2018-06-26 DIAGNOSIS — I10 Essential (primary) hypertension: Secondary | ICD-10-CM | POA: Diagnosis not present

## 2018-06-26 DIAGNOSIS — I4819 Other persistent atrial fibrillation: Secondary | ICD-10-CM | POA: Diagnosis not present

## 2018-07-26 DIAGNOSIS — R221 Localized swelling, mass and lump, neck: Secondary | ICD-10-CM | POA: Diagnosis not present

## 2018-07-26 DIAGNOSIS — R070 Pain in throat: Secondary | ICD-10-CM | POA: Diagnosis not present

## 2018-07-28 DIAGNOSIS — R221 Localized swelling, mass and lump, neck: Secondary | ICD-10-CM | POA: Diagnosis not present

## 2018-07-28 DIAGNOSIS — R070 Pain in throat: Secondary | ICD-10-CM | POA: Diagnosis not present

## 2018-08-02 DIAGNOSIS — R220 Localized swelling, mass and lump, head: Secondary | ICD-10-CM | POA: Diagnosis not present

## 2018-08-02 DIAGNOSIS — C109 Malignant neoplasm of oropharynx, unspecified: Secondary | ICD-10-CM | POA: Diagnosis not present

## 2018-08-02 DIAGNOSIS — C01 Malignant neoplasm of base of tongue: Secondary | ICD-10-CM | POA: Diagnosis not present

## 2018-08-07 ENCOUNTER — Other Ambulatory Visit: Payer: Self-pay | Admitting: Otolaryngology

## 2018-08-07 DIAGNOSIS — Z7289 Other problems related to lifestyle: Secondary | ICD-10-CM | POA: Diagnosis not present

## 2018-08-07 DIAGNOSIS — E0789 Other specified disorders of thyroid: Secondary | ICD-10-CM

## 2018-08-07 DIAGNOSIS — Z7901 Long term (current) use of anticoagulants: Secondary | ICD-10-CM | POA: Diagnosis not present

## 2018-08-07 DIAGNOSIS — R221 Localized swelling, mass and lump, neck: Secondary | ICD-10-CM | POA: Diagnosis not present

## 2018-08-07 DIAGNOSIS — K148 Other diseases of tongue: Secondary | ICD-10-CM | POA: Diagnosis not present

## 2018-08-07 DIAGNOSIS — K1379 Other lesions of oral mucosa: Secondary | ICD-10-CM | POA: Diagnosis not present

## 2018-08-07 DIAGNOSIS — E079 Disorder of thyroid, unspecified: Secondary | ICD-10-CM

## 2018-08-07 DIAGNOSIS — Z87891 Personal history of nicotine dependence: Secondary | ICD-10-CM | POA: Diagnosis not present

## 2018-08-09 ENCOUNTER — Other Ambulatory Visit (HOSPITAL_COMMUNITY): Payer: Self-pay | Admitting: Otolaryngology

## 2018-08-09 DIAGNOSIS — E079 Disorder of thyroid, unspecified: Secondary | ICD-10-CM

## 2018-08-09 DIAGNOSIS — E0789 Other specified disorders of thyroid: Secondary | ICD-10-CM

## 2018-08-09 DIAGNOSIS — C01 Malignant neoplasm of base of tongue: Secondary | ICD-10-CM

## 2018-08-15 ENCOUNTER — Other Ambulatory Visit: Payer: Self-pay | Admitting: Otolaryngology

## 2018-08-15 DIAGNOSIS — E079 Disorder of thyroid, unspecified: Secondary | ICD-10-CM

## 2018-08-15 DIAGNOSIS — E0789 Other specified disorders of thyroid: Secondary | ICD-10-CM

## 2018-08-29 ENCOUNTER — Other Ambulatory Visit (HOSPITAL_COMMUNITY): Payer: Self-pay | Admitting: Otolaryngology

## 2018-08-29 DIAGNOSIS — E079 Disorder of thyroid, unspecified: Secondary | ICD-10-CM

## 2018-08-29 DIAGNOSIS — E0789 Other specified disorders of thyroid: Secondary | ICD-10-CM

## 2018-08-30 ENCOUNTER — Other Ambulatory Visit (HOSPITAL_COMMUNITY): Payer: Self-pay | Admitting: Otolaryngology

## 2018-08-30 DIAGNOSIS — E079 Disorder of thyroid, unspecified: Secondary | ICD-10-CM

## 2018-08-30 DIAGNOSIS — E0789 Other specified disorders of thyroid: Secondary | ICD-10-CM

## 2018-08-30 DIAGNOSIS — K148 Other diseases of tongue: Secondary | ICD-10-CM

## 2018-08-30 DIAGNOSIS — R221 Localized swelling, mass and lump, neck: Secondary | ICD-10-CM

## 2018-09-04 ENCOUNTER — Ambulatory Visit (HOSPITAL_COMMUNITY)
Admission: RE | Admit: 2018-09-04 | Discharge: 2018-09-04 | Disposition: A | Discharge status: Home / Self Care | Payer: Medicare Other | Source: Ambulatory Visit | Attending: Otolaryngology | Admitting: Otolaryngology

## 2018-09-04 ENCOUNTER — Other Ambulatory Visit: Payer: Self-pay

## 2018-09-04 ENCOUNTER — Ambulatory Visit (HOSPITAL_COMMUNITY)
Admission: RE | Admit: 2018-09-04 | Discharge: 2018-09-04 | Disposition: A | Discharge status: Home / Self Care | Payer: Medicare Other | Source: Ambulatory Visit

## 2018-09-04 ENCOUNTER — Encounter (HOSPITAL_COMMUNITY): Payer: Self-pay

## 2018-09-04 DIAGNOSIS — E0789 Other specified disorders of thyroid: Secondary | ICD-10-CM

## 2018-09-04 DIAGNOSIS — R590 Localized enlarged lymph nodes: Secondary | ICD-10-CM | POA: Diagnosis not present

## 2018-09-04 DIAGNOSIS — K148 Other diseases of tongue: Secondary | ICD-10-CM

## 2018-09-04 DIAGNOSIS — C801 Malignant (primary) neoplasm, unspecified: Secondary | ICD-10-CM | POA: Diagnosis not present

## 2018-09-04 DIAGNOSIS — E041 Nontoxic single thyroid nodule: Secondary | ICD-10-CM | POA: Diagnosis not present

## 2018-09-04 DIAGNOSIS — R221 Localized swelling, mass and lump, neck: Secondary | ICD-10-CM | POA: Diagnosis not present

## 2018-09-04 DIAGNOSIS — K1123 Chronic sialoadenitis: Secondary | ICD-10-CM | POA: Diagnosis not present

## 2018-09-04 DIAGNOSIS — R220 Localized swelling, mass and lump, head: Secondary | ICD-10-CM | POA: Diagnosis not present

## 2018-09-04 DIAGNOSIS — D490 Neoplasm of unspecified behavior of digestive system: Secondary | ICD-10-CM | POA: Diagnosis not present

## 2018-09-04 DIAGNOSIS — E079 Disorder of thyroid, unspecified: Secondary | ICD-10-CM

## 2018-09-04 DIAGNOSIS — C770 Secondary and unspecified malignant neoplasm of lymph nodes of head, face and neck: Secondary | ICD-10-CM | POA: Diagnosis not present

## 2018-09-04 HISTORY — PX: IR US GUIDANCE: IMG2393

## 2018-09-04 HISTORY — PX: MOUTH BIOPSY: SHX1012

## 2018-09-04 HISTORY — PX: IR US GUIDE BX ASP/DRAIN: IMG2392

## 2018-09-04 LAB — CBC WITH DIFFERENTIAL/PLATELET
Abs Immature Granulocytes: 0.14 10*3/uL — ABNORMAL HIGH (ref 0.00–0.07)
Basophils Absolute: 0.1 10*3/uL (ref 0.0–0.1)
Basophils Relative: 1 %
Eosinophils Absolute: 0 10*3/uL (ref 0.0–0.5)
Eosinophils Relative: 0 %
HCT: 51.3 % (ref 39.0–52.0)
Hemoglobin: 17 g/dL (ref 13.0–17.0)
Immature Granulocytes: 2 %
Lymphocytes Relative: 9 %
Lymphs Abs: 0.6 10*3/uL — ABNORMAL LOW (ref 0.7–4.0)
MCH: 30.8 pg (ref 26.0–34.0)
MCHC: 33.1 g/dL (ref 30.0–36.0)
MCV: 92.9 fL (ref 80.0–100.0)
Monocytes Absolute: 0.5 10*3/uL (ref 0.1–1.0)
Monocytes Relative: 8 %
Neutro Abs: 5 10*3/uL (ref 1.7–7.7)
Neutrophils Relative %: 80 %
Platelets: 171 10*3/uL (ref 150–400)
RBC: 5.52 MIL/uL (ref 4.22–5.81)
RDW: 14.2 % (ref 11.5–15.5)
WBC: 6.4 10*3/uL (ref 4.0–10.5)
nRBC: 0 % (ref 0.0–0.2)

## 2018-09-04 LAB — PROTIME-INR
INR: 0.9 (ref 0.8–1.2)
Prothrombin Time: 12.5 seconds (ref 11.4–15.2)

## 2018-09-04 MED ORDER — LIDOCAINE-EPINEPHRINE (PF) 1 %-1:200000 IJ SOLN
INTRAMUSCULAR | Status: AC | PRN
  Administered 2018-09-04: 10 mL

## 2018-09-04 MED ORDER — FENTANYL CITRATE (PF) 100 MCG/2ML IJ SOLN
INTRAMUSCULAR | Status: AC | PRN
  Administered 2018-09-04 (×2): 50 ug via INTRAVENOUS

## 2018-09-04 MED ORDER — MIDAZOLAM HCL 2 MG/2ML IJ SOLN
INTRAMUSCULAR | Status: AC
  Filled 2018-09-04: qty 4

## 2018-09-04 MED ORDER — MIDAZOLAM HCL 2 MG/2ML IJ SOLN
INTRAMUSCULAR | Status: AC | PRN
  Administered 2018-09-04 (×3): 1 mg via INTRAVENOUS

## 2018-09-04 MED ORDER — LIDOCAINE-EPINEPHRINE 1 %-1:100000 IJ SOLN
INTRAMUSCULAR | Status: AC
  Filled 2018-09-04: qty 1

## 2018-09-04 MED ORDER — SODIUM CHLORIDE 0.9 % IV SOLN
INTRAVENOUS | Status: DC
  Administered 2018-09-04: 15:00:00 via INTRAVENOUS

## 2018-09-04 MED ORDER — FENTANYL CITRATE (PF) 100 MCG/2ML IJ SOLN
INTRAMUSCULAR | Status: AC
  Filled 2018-09-04: qty 2

## 2018-09-04 NOTE — Discharge Instructions (Signed)
Moderate Conscious Sedation, Adult, Care After These instructions provide you with information about caring for yourself after your procedure. Your health care provider may also give you more specific instructions. Your treatment has been planned according to current medical practices, but problems sometimes occur. Call your health care provider if you have any problems or questions after your procedure. What can I expect after the procedure? After your procedure, it is common:  To feel sleepy for several hours.  To feel clumsy and have poor balance for several hours.  To have poor judgment for several hours.  To vomit if you eat too soon. Follow these instructions at home: For at least 24 hours after the procedure:   Do not: ? Participate in activities where you could fall or become injured. ? Drive. ? Use heavy machinery. ? Drink alcohol. ? Take sleeping pills or medicines that cause drowsiness. ? Make important decisions or sign legal documents. ? Take care of children on your own.  Rest. Eating and drinking  Follow the diet recommended by your health care provider.  If you vomit: ? Drink water, juice, or soup when you can drink without vomiting. ? Make sure you have little or no nausea before eating solid foods. General instructions  Have a responsible adult stay with you until you are awake and alert.  Take over-the-counter and prescription medicines only as told by your health care provider.  If you smoke, do not smoke without supervision.  Keep all follow-up visits as told by your health care provider. This is important. Contact a health care provider if:  You keep feeling nauseous or you keep vomiting.  You feel light-headed.  You develop a rash.  You have a fever. Get help right away if:  You have trouble breathing. This information is not intended to replace advice given to you by your health care provider. Make sure you discuss any questions you have  with your health care provider. Document Released: 12/13/2012 Document Revised: 02/04/2017 Document Reviewed: 06/14/2015 Elsevier Patient Education  2020 [employer].   Needle Biopsy, Care After This sheet gives you information about how to care for yourself after your procedure. Your health care provider may also give you more specific instructions. If you have problems or questions, contact your health care provider. What can I expect after the procedure? After the procedure, it is common to have soreness, bruising, or mild pain at the puncture site. This should go away in a few days. Follow these instructions at home: Needle insertion site care   Wash your hands with soap and water before you change your bandage (dressing). If you cannot use soap and water, use hand sanitizer.  Follow instructions from your health care provider about how to take care of your puncture site. This includes: ? When and how to change your dressing. May remove dressing and shower or bathe in 24 to 48 hours.  Keep site clean and dry and replace bandage with bandaid as necessary. ? When to remove your dressing.  Check your puncture site every day for signs of infection. Check for: ? Redness, swelling, or pain. ? Fluid or blood. ? Pus or a bad smell. ? Warmth. General instructions  Return to your normal activities as told by your health care provider. Ask your health care provider what activities are safe for you.  Do not take baths, swim, or use a hot tub until your health care provider approves. Ask your health care provider if you may take showers.  You may only be allowed to take sponge baths.  Take over-the-counter and prescription medicines only as told by your health care provider.  Keep all follow-up visits as told by your health care provider. This is important. Contact a health care provider if:  You have a fever.  You have redness, swelling, or pain at the puncture site that lasts longer  than a few days.  You have fluid, blood, or pus coming from your puncture site.  Your puncture site feels warm to the touch. Get help right away if:  You have severe bleeding from the puncture site. Summary  After the procedure, it is common to have soreness, bruising, or mild pain at the puncture site. This should go away in a few days.  Check your puncture site every day for signs of infection, such as redness, swelling, or pain.  Get help right away if you have severe bleeding from your puncture site. This information is not intended to replace advice given to you by your health care provider. Make sure you discuss any questions you have with your health care provider. Document Released: 07/09/2014 Document Revised: 05/06/2017 Document Reviewed: 03/07/2017 Elsevier Patient Education  2020 [employer].

## 2018-09-04 NOTE — Consult Note (Signed)
Chief Complaint: Patient was seen in consultation today for US guided right submandibular mass/right thyroid and possible left thyroid nodule biopsies.   Referring Physician(s): Helayne Seminole  Supervising Physician: Sandi Mariscal  Patient Status: [facility] - Out-pt  History of Present Illness: Neil [name] is a 73 y.o. male , remote smoker, with history of right neck swelling/pain/dysphagia and outside imaging findings of right submandibular mass with associated cervical lymphadenopathy as well as multinodular goiter and tongue mass.  He has no known history of cancer and presents today for ultrasound-guided right submandibular mass/right thyroid and possible left thyroid nodule biopsies.  He is on Eliquis for atrial fibrillation and has been off for the last 2 to 3 days.  Past Medical History:  Diagnosis Date  . Hypertension   . Joint pain     Past Surgical History:  Procedure Laterality Date  . right testical removed      Allergies: Patient has no known allergies.  Medications: Prior to Admission medications   Medication Sig Start Date End Date Taking? Authorizing Provider  acidophilus (RISAQUAD) CAPS capsule Take 1 capsule by mouth daily.    [provider]  hydrochlorothiazide (HYDRODIURIL) 25 MG tablet Take 1 tablet (25 mg total) by mouth daily. 03/25/15   Little, Wenda Overland, MD  ibuprofen (ADVIL,MOTRIN) 200 MG tablet Take 400-800 mg by mouth every 6 (six) hours as needed for moderate pain.    [provider]  Multiple Vitamin (MULTIVITAMIN WITH MINERALS) TABS tablet Take 1 tablet by mouth daily.    [provider]  OVER THE COUNTER MEDICATION Take 1 capsule by mouth daily.    [provider]     No family history on file.  Social History   Socioeconomic History  . Marital status: Divorced    Spouse name: Not on file  . Number of children: Not on file  . Years of education: Not on file  . Highest education level: Not on  file  Occupational History  . Not on file  Social Needs  . Financial resource strain: Not on file  . Food insecurity    Worry: Not on file    Inability: Not on file  . Transportation needs    Medical: Not on file    Non-medical: Not on file  Tobacco Use  . Smoking status: Never Smoker  Substance and Sexual Activity  . Alcohol use: Yes  . Drug use: No  . Sexual activity: Not on file  Lifestyle  . Physical activity    Days per week: Not on file    Minutes per session: Not on file  . Stress: Not on file  Relationships  . Social Herbalist on phone: Not on file    Gets together: Not on file    Attends religious service: Not on file    Active member of club or organization: Not on file    Attends meetings of clubs or organizations: Not on file    Relationship status: Not on file  Other Topics Concern  . Not on file  Social History Narrative  . Not on file      Review of Systems see above; he currently  denies fever, headache, chest pain, dyspnea, cough, abdominal/back pain, nausea, vomiting or bleeding.  Vital Signs: Blood pressure 182/102, heart rate 65, temp 98.2, respirations 18, O2 sats 99% room air   Physical Exam awake, alert.  Chest clear to auscultation bilaterally.  Heart with normal rate, irreg rhythm.  Abdomen  soft, positive bowel sounds, nontender.  Trace to 1+ pretibial edema bilaterally.  Right submandibular mass /cervical adenopathy with some tenderness to palpation  Imaging: US Thyroid  Result Date: 09/04/2018 CLINICAL DATA:  Incidental on CT. Suspected thyroid nodules on neck CT performed 08/02/2018 EXAM: THYROID ULTRASOUND TECHNIQUE: Ultrasound examination of the thyroid gland and adjacent soft tissues was performed. COMPARISON:  Soft tissue neck ultrasound-07/28/2018; contrast-enhanced neck CT-08/02/2018 FINDINGS: Parenchymal Echotexture: Normal Isthmus: Atrophic. Right lobe: Normal in size measuring 5.4 x 1.3 x 3.4 cm Left lobe: Normal in size  measuring 4.1 x 1.4 x 3.3 cm _________________________________________________________ Estimated total number of nodules >/= 1 cm: 1 Number of spongiform nodules >/=  2 cm not described below (TR1): 0 Number of mixed cystic and solid nodules >/= 1.5 cm not described below ([place]): 0 _________________________________________________________ Nodule # 1: Location: Right; Inferior Maximum size: 1.1 cm; Other 2 dimensions: 1.0 x 1.0 cm Composition: solid/almost completely solid (2) Echogenicity: hypoechoic (2) Shape: not taller-than-wide (0) Margins: extra-thyroidal extension (3) Echogenic foci: none (0) ACR TI-RADS total points: 7. ACR TI-RADS risk category: TR5 (>/= 7 points). ACR TI-RADS recommendations: **Given size (>/= 1.0 cm) and appearance, fine needle aspiration of this highly suspicious nodule should be considered based on TI-RADS criteria. _________________________________________________________ There is a punctate (approximately 0.4 cm) hypoechoic nodule within the superior pole the right lobe of the thyroid which does not meet imaging criteria to recommend percutaneous sampling or continued dedicated follow-up. There is a punctate (approximately 0.3 cm) dystrophic calcification within the superior pole the left lobe of the thyroid without definitive associated nodule which does not meet imaging criteria to recommend percutaneous sampling or continued dedicated follow-up There is an approximately 0.6 cm echogenic shadowing dystrophic calcification within the mid aspect the left lobe of the thyroid which is without associated definitive nodule and does not meet imaging criteria to recommend percutaneous sampling or continued dedicated follow-up. There is an approximately 0.5 cm hypoechoic nodule within the mid/inferior aspect the left lobe of the thyroid which does not meet imaging criteria to recommend percutaneous sampling or continued dedicated follow-up. There is an approximately 0.8 cm hypoechoic nodule  within the superior pole the left lobe of the thyroid which does not meet imaging criteria to recommend percutaneous sampling or continued dedicated follow-up. _________________________________________________________ Grossly unchanged appearance of known right-sided submandibular mass measuring approximately 2.6 x 2.6 x 1.8 cm, grossly unchanged compared to recently performed soft tissue neck ultrasound and contrast-enhanced neck CT. IMPRESSION: 1. Findings suggestive of multinodular goiter. 2. Nodule #1 within the right lobe of the thyroid meets imaging criteria to recommend percutaneous sampling as clinically indicated. 3. Grossly unchanged appearance of known right sided submandibular mass, compatible with cervical lymphadenopathy demonstrated on contrast-enhanced neck CT. The above is in keeping with the ACR TI-RADS recommendations - J Am Coll Radiol 2017;14:587-595. Electronically Signed   By: Sandi Mariscal M.D.   On: 09/04/2018 14:12    Labs:  CBC: No results for input(s): WBC, HGB, HCT, PLT in the last 8760 hours.  COAGS: No results for input(s): INR, APTT in the last 8760 hours.  BMP: No results for input(s): NA, K, CL, CO2, GLUCOSE, BUN, CALCIUM, CREATININE, GFRNONAA, GFRAA in the last 8760 hours.  Invalid input(s): CMP  LIVER FUNCTION TESTS: No results for input(s): BILITOT, AST, ALT, ALKPHOS, PROT, ALBUMIN in the last 8760 hours.  TUMOR MARKERS: No results for input(s): AFPTM, CEA, CA199, CHROMGRNA in the last 8760 hours.  Assessment and Plan: 73 y.o. male , remote smoker,  with history of right neck swelling/pain/dysphagia and outside imaging findings of right submandibular mass with associated cervical lymphadenopathy as well as multinodular goiter and tongue mass.  He has no known history of cancer and presents today for ultrasound-guided right submandibular mass/right thyroid and possible left thyroid nodule biopsies.  He is on Eliquis for atrial fibrillation and has been off for  the last 2 to 3 days.Risks and benefits of procedures was discussed with the patient  including, but not limited to bleeding, infection, damage to adjacent structures or low yield requiring additional tests.  All of the questions were answered and there is agreement to proceed.  Consent signed and in chart.  LABS PENDING   Thank you for this interesting consult.  I greatly enjoyed meeting Neil [name] and look forward to participating in their care.  A copy of this report was sent to the requesting provider on this date.  Electronically Signed: D. Rowe Robert, PA-C 09/04/2018, 2:42 PM   I spent a total of  25 minutes   in face to face in clinical consultation, greater than 50% of which was counseling/coordinating care for ultrasound-guided right submandibular mass, right thyroid, possible left thyroid nodule biopsies

## 2018-09-04 NOTE — Procedures (Signed)
Pre Procedure Dx: Right submandibular mass/lymphadenopathy; Indeterminate right thyroid nodule Post Procedural Dx: Same  Technically successful US guided biopsy of indeterminate right submandibular mass/lymphadenopathy. Technically successful US guided FNA of indeterminate right side thyroid nodule.  EBL: None  No immediate complications.   Ronny Bacon, MD Pager #: [phone]

## 2018-09-05 ENCOUNTER — Other Ambulatory Visit (HOSPITAL_COMMUNITY): Payer: Self-pay | Admitting: Otolaryngology

## 2018-09-05 ENCOUNTER — Other Ambulatory Visit: Payer: Self-pay | Admitting: Otolaryngology

## 2018-09-05 DIAGNOSIS — C7989 Secondary malignant neoplasm of other specified sites: Secondary | ICD-10-CM

## 2018-09-05 DIAGNOSIS — D3709 Neoplasm of uncertain behavior of other specified sites of the oral cavity: Secondary | ICD-10-CM

## 2018-09-05 DIAGNOSIS — E079 Disorder of thyroid, unspecified: Secondary | ICD-10-CM

## 2018-09-05 DIAGNOSIS — E0789 Other specified disorders of thyroid: Secondary | ICD-10-CM

## 2018-09-11 ENCOUNTER — Other Ambulatory Visit: Payer: Self-pay

## 2018-09-11 ENCOUNTER — Ambulatory Visit (HOSPITAL_COMMUNITY)
Admission: RE | Admit: 2018-09-11 | Discharge: 2018-09-11 | Disposition: A | Discharge status: Home / Self Care | Payer: Medicare Other | Source: Ambulatory Visit | Attending: Otolaryngology | Admitting: Otolaryngology

## 2018-09-11 DIAGNOSIS — I4891 Unspecified atrial fibrillation: Secondary | ICD-10-CM | POA: Diagnosis not present

## 2018-09-11 DIAGNOSIS — C7989 Secondary malignant neoplasm of other specified sites: Secondary | ICD-10-CM | POA: Diagnosis not present

## 2018-09-11 DIAGNOSIS — Z87891 Personal history of nicotine dependence: Secondary | ICD-10-CM | POA: Diagnosis not present

## 2018-09-11 DIAGNOSIS — Z7901 Long term (current) use of anticoagulants: Secondary | ICD-10-CM | POA: Diagnosis not present

## 2018-09-11 DIAGNOSIS — Z79899 Other long term (current) drug therapy: Secondary | ICD-10-CM | POA: Insufficient documentation

## 2018-09-11 DIAGNOSIS — C109 Malignant neoplasm of oropharynx, unspecified: Secondary | ICD-10-CM | POA: Insufficient documentation

## 2018-09-11 DIAGNOSIS — E0789 Other specified disorders of thyroid: Secondary | ICD-10-CM | POA: Diagnosis not present

## 2018-09-11 DIAGNOSIS — E079 Disorder of thyroid, unspecified: Secondary | ICD-10-CM

## 2018-09-11 DIAGNOSIS — C770 Secondary and unspecified malignant neoplasm of lymph nodes of head, face and neck: Secondary | ICD-10-CM | POA: Diagnosis not present

## 2018-09-11 DIAGNOSIS — D3709 Neoplasm of uncertain behavior of other specified sites of the oral cavity: Secondary | ICD-10-CM

## 2018-09-11 LAB — GLUCOSE, CAPILLARY: Glucose-Capillary: 89 mg/dL (ref 70–99)

## 2018-09-11 MED ORDER — FLUDEOXYGLUCOSE F - 18 (FDG) INJECTION
12.5000 | Freq: Once | INTRAVENOUS | Status: AC | PRN
  Administered 2018-09-11: 12.5 via INTRAVENOUS

## 2018-09-18 ENCOUNTER — Encounter (HOSPITAL_COMMUNITY): Payer: Self-pay

## 2018-10-04 DIAGNOSIS — Z9089 Acquired absence of other organs: Secondary | ICD-10-CM | POA: Diagnosis not present

## 2018-10-04 DIAGNOSIS — J358 Other chronic diseases of tonsils and adenoids: Secondary | ICD-10-CM | POA: Diagnosis not present

## 2018-10-04 DIAGNOSIS — Z87891 Personal history of nicotine dependence: Secondary | ICD-10-CM | POA: Diagnosis not present

## 2018-10-04 DIAGNOSIS — C109 Malignant neoplasm of oropharynx, unspecified: Secondary | ICD-10-CM | POA: Diagnosis not present

## 2018-10-04 DIAGNOSIS — Z7289 Other problems related to lifestyle: Secondary | ICD-10-CM | POA: Diagnosis not present

## 2018-10-04 DIAGNOSIS — C01 Malignant neoplasm of base of tongue: Secondary | ICD-10-CM | POA: Diagnosis not present

## 2018-10-04 DIAGNOSIS — E041 Nontoxic single thyroid nodule: Secondary | ICD-10-CM | POA: Diagnosis not present

## 2018-10-04 DIAGNOSIS — R030 Elevated blood-pressure reading, without diagnosis of hypertension: Secondary | ICD-10-CM | POA: Diagnosis not present

## 2018-10-10 ENCOUNTER — Telehealth: Payer: Self-pay | Admitting: *Deleted

## 2018-10-10 ENCOUNTER — Ambulatory Visit
Admission: RE | Admit: 2018-10-10 | Discharge: 2018-10-10 | Disposition: A | Discharge status: Home / Self Care | Payer: Self-pay | Source: Ambulatory Visit | Attending: Hematology | Admitting: Hematology

## 2018-10-10 DIAGNOSIS — C01 Malignant neoplasm of base of tongue: Secondary | ICD-10-CM

## 2018-10-10 NOTE — Telephone Encounter (Signed)
Placed introductory call to new referral patient Mr. Bagnell.  Introduced myself as the H&N oncology nurse navigator that works with Drs. Isidore Moos and Maylon Peppers to whom he has been referred by Dr. Nicolette Bang.  He confirmed understanding of referral.  Briefly explained my role as his navigator, provided my contact information.   He reported he met with Dr. Nicolette Bang last week to discuss TORS, stated he wanted to meet with docs to discuss role of RT with/without chemotherapy before deciding on tmt.  He noted he is leaning towards RT/chemo b/c lives alone, has no one to provided in-home post-surgical support.  Explained appts with Drs. Isidore Moos and Maylon Peppers to be scheduled for next week.  Confirmed understanding of [facility] location, explained arrival and registration process.  I answered his questions re surgery with likelihood of adjuvant RT vs chemoRT.  I explained the purpose of a dental evaluation prior to starting RT, indicated he wd be contacted by [facility] to arrange an appt within a day or so of his appt with Dr. Isidore Moos.    I encouraged him to call with questions/concerns as he moves forward with appts and procedures.    He verbalized understanding of information provided, expressed appreciation for my call.   Navigator Initial Assessment . Employment Status/FMLA/STD:  Retired, flexible hours for appts . Support System: Lives alone but 2 brothers live nearby who can provide support. Marland Kitchen PCP:  Precious Haws . PCD: No.  Edentulous except for 2 molars both sides. . Transportation Needs: No.  Able to drive self. . Sensory Deficits/Language Barriers/Interpreter Needed:  No . Ambulation Needs: No . DME Used in Home:  Uses cane on rare occasion. Marland Kitchen Psychosocial Needs:  No . Concerns/Needs Understanding Cancer:  Answered by navigator. . Self-Expressed Needs: Denied  Gayleen Orem, RN, BSN Head & Neck Oncology Nurse [facility] [phone]

## 2018-10-11 ENCOUNTER — Telehealth: Payer: Self-pay | Admitting: *Deleted

## 2018-10-11 NOTE — Telephone Encounter (Signed)
Oncology Nurse Navigator Documentation  Called Mr. Ketchum.  Informed him of 8/12 8:30 lab followed by 9:00 consult with Dr. Maylon Peppers.  Confirmed his understanding of [place] location, explained arrival/registration procedures.  Encouraged him to arrive by 8:15.  He voiced understanding I will join him during the consultation.  Confirmed his understanding of 8/11 telemedicine calls: 8:30 Nurse Eval followed by 9:00 consult with Dr. Isidore Moos. I encouraged him to call me with questions/concerns prior to next week's appts.  Gayleen Orem, RN, BSN Head & Neck Oncology Nurse [facility] [place] [phone]

## 2018-10-12 NOTE — Progress Notes (Signed)
Radiation Oncology         [phone] ________________________________  Initial Outpatient Consultation performed by phone due to pandemic risks.  Patient could not access WebEx.  Name: Neil [name] MRN: [id number]  Date: 10/17/2018  DOB: 02-Aug-1945  WN:[id number], No Pcp Per  Corey Skains, MD   REFERRING PHYSICIAN: Corey Skains, MD  DIAGNOSIS:    ICD-10-CM   1. Cancer of base of tongue (HCC)  C01     Cancer Staging No matching staging information was found for the patient. Staging pending p16 status.  CHIEF COMPLAINT: Here to discuss management of oropharyngeal cancer  HISTORY OF PRESENT ILLNESS::Neil [name] is a 73 y.o. male who presented with a right neck mass that he noticed in April 2020. He did also report right throat pain that started about 10 months ago; no improvement with antibiotics or OTC remedies. He also had some dysphagia, especially with cold or spicy foods.   Subsequently, the patient saw Dr. Doran Heater who identified a right tongue base lesion. CT scan of the neck on 08/02/2018 showed a right oropharyngeal carcinoma with a 2.8 cm primarily submucosal mass in the right posterior tongue. There was a single ipsilateral malignant lymph node measuring 3 cm. Nodular thickening was noted along the anterior right sublingual gland.  Biopsy of right submandibular lymph node on 09/04/2018 revealed: Squamous cell carcinoma. p16 unknown.  He was also incidentally noted on CT to have a calcified nodule in the thyroid.  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 This specimen was sent for Afirma testing. The result of this 1.1 cm Bethesda IV nodule was Afirma GSC Benign, which suggests a low risk of cancer at approximately 4%.   Of note he also had an area in the sublingual region on the right that was biopsied and benign.  Pertinent imaging thus far includes  PET scan performed on 09/11/2018 revealing hypermetabolism along the right base of tongue, corresponding to the patient's known primary oropharyngeal cancer. Ipsilateral level 2 cervical nodal metastasis. 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 (Note: this area was actually biopsied and benign)  The patient saw Dr. Hezzie Bump on 10/04/2018 to review treatment options. These included surgery (transhyoid tongue base resection, bilateral neck dissection, tracheotomy) followed by adjuvant therapy as indicated by pathology results vs. beginning with chemoradiotherapy with surgery reserved for salvage. The patient has been referred today for discussion of potential radiation treatment options.  He has decided he is not want to pursue surgery  Young Berry, RN, our Head and Neck Oncology Navigator has sent for p16 status on pathology  Swallowing issues, if any: Occasionally he reports that his muscles are too tight in the back of his throat  Weight Changes: None  Pain status: Some throat pain  Other symptoms: None  Tobacco history, if any: He quit smoking in the 1970s.  ETOH abuse, if any: He drinks approximately 2-3 glasses of wine weekly.   PREVIOUS RADIATION THERAPY: No  PAST MEDICAL HISTORY:  has a past medical history of Atrial fibrillation (HCC), Chronic anticoagulation, GERD (gastroesophageal reflux disease), Hypertension, and Joint pain.    PAST SURGICAL HISTORY: Past Surgical History:  Procedure Laterality Date  . HEMORROIDECTOMY  2004  . HERNIA REPAIR  1960  . IR US GUIDANCE  09/04/2018  . IR US GUIDE BX ASP/DRAIN  09/04/2018  . MOUTH BIOPSY  09/04/2018  right jaw  . right testical removed    . TONSILLECTOMY      FAMILY HISTORY: family history is not on file.  SOCIAL HISTORY:  reports that he quit smoking about 50 years ago. He has a 5.00 pack-year smoking history. He has never used smokeless  tobacco. He reports previous alcohol use. He reports that he does not use drugs.  ALLERGIES: Lisinopril  MEDICATIONS:  Current Outpatient Medications  Medication Sig Dispense Refill  . amiodarone (PACERONE) 200 MG tablet Take 200 mg by mouth 2 (two) times daily.     Marland Kitchen Apixaban (ELIQUIS PO) Take 5 mg by mouth 2 (two) times daily.     Marland Kitchen co-enzyme Q-10 30 MG capsule Take 30 mg by mouth 2 (two) times daily. 200 mg twice daily    . furosemide (LASIX) 40 MG tablet Take 40 mg by mouth daily.     Marland Kitchen HYDROcodone-acetaminophen (NORCO/VICODIN) 5-325 MG tablet Take 1 tablet by mouth every 4 (four) hours as needed for moderate pain.    Marland Kitchen omeprazole (PRILOSEC) 20 MG capsule Take 20 mg by mouth daily.    . tamsulosin (FLOMAX) 0.4 MG CAPS capsule Take 0.4 mg by mouth daily after supper.     No current facility-administered medications for this encounter.     REVIEW OF SYSTEMS:  Notable for that above.   PHYSICAL EXAM:  vitals were not taken for this visit.   General: Alert and oriented, in no acute distress     LABORATORY DATA:  Lab Results  Component Value Date   WBC 6.4 09/04/2018   HGB 17.0 09/04/2018   HCT 51.3 09/04/2018   MCV 92.9 09/04/2018   PLT 171 09/04/2018   CMP     Component Value Date/Time   NA 140 03/25/2015 0628   K 3.9 03/25/2015 0628   CL 108 03/25/2015 0628   CO2 20 (L) 03/25/2015 0628   GLUCOSE 124 (H) 03/25/2015 0628   BUN 15 03/25/2015 0628   CREATININE 0.94 03/25/2015 0628   CALCIUM 9.3 03/25/2015 0628   PROT 7.8 03/25/2015 0628   ALBUMIN 4.8 03/25/2015 0628   AST 21 03/25/2015 0628   ALT 11 (L) 03/25/2015 0628   ALKPHOS 36 (L) 03/25/2015 0628   BILITOT 0.9 03/25/2015 0628   GFRNONAA >60 03/25/2015 0628   GFRAA >60 03/25/2015 0628      No results found for: TSH   RADIOGRAPHY: As above  IMPRESSION/PLAN:  This is a delightful patient with head and neck cancer.  He has declined the option of surgery.  I recommend radiotherapy for this patient.  We  discussed the potential risks, benefits, and side effects of radiotherapy. We talked in detail about acute and late effects. We discussed that some of the most bothersome acute effects may be mucositis, dysgeusia, salivary changes, skin irritation, hair loss, dehydration, weight loss and fatigue. We talked about late effects which include but are not necessarily limited to dysphagia, hypothyroidism, nerve injury, spinal cord injury, xerostomia, trismus, and neck edema. No guarantees of treatment were given. A consent form was signed and placed in the patient's medical record. The patient is enthusiastic about proceeding with treatment. I look forward to participating in the patient's care.    Simulation (treatment planning) will take place once cleared by dentistry  We also discussed that the treatment of head and neck cancer is a multidisciplinary process to maximize treatment outcomes and quality of life. For this reasons the following referrals have been or will be made:  Medical oncology to discuss chemotherapy    Dentistry for dental evaluation, possible extractions in the radiation fields, and /or advice on reducing risk of cavities, osteoradionecrosis, or other oral issues.   Nutritionist for nutrition support during and after treatment.   Speech language pathology for swallowing and/or speech therapy.   Social work for social support.    Baseline labs including TSH.  This encounter was provided by telemedicine platform phone as he could not access WebEx The patient has given verbal consent for this type of encounter and has been advised to only accept a meeting of this type in a secure network environment. The time spent during this encounter was 30 minutes. The attendants for this meeting include Lonie Peak  and Pennelope Bracken.  Also Young Berry, RN, our Head and Neck Oncology Navigator. During the encounter, Lonie Peak was located at [facility] Radiation Oncology  Department.  Neil [name] was located at home.   __________________________________________   Lonie Peak, MD  This document serves as a record of services personally performed by Lonie Peak, MD. It was created on her behalf by Ivar Bury, a trained medical scribe. The creation of this record is based on the scribe's personal observations and the provider's statements to them. This document has been checked and approved by the attending provider.

## 2018-10-12 NOTE — Progress Notes (Signed)
Head and Neck Cancer Location of Tumor / Histology:  09/04/18 Diagnosis Lymph node, needle/core biopsy, right submandibular - SQUAMOUS CELL CARCINOMA. - SEE MICROSCOPIC DESCRIPTION   Patient presented with symptoms of: A right neck mass that he noticed around 06/2018. He did also report throat pain since September 2019.   Biopsies of right submandibular lymph node  revealed: squamous cell carcinoma . Nutrition Status Yes No Comments  Weight changes? []  [x]    Swallowing concerns? [x]  []  He reports difficulty swallowing once in a while. He says the "muscles are too tight to the back of his throat"  PEG? []  [x]     Referrals Yes No Comments  Social Work? []  [x]    Dentistry? [x]  []  Dr. Enrique Sack 10/16/18. He has been referred to Dr. Lady Saucier for tooth extraction.  Swallowing therapy? []  [x]    Nutrition? []  [x]    Med/Onc? [x]  []  Dr. Maylon Peppers on 10/18/18   Safety Issues Yes No Comments  Prior radiation? []  [x]    Pacemaker/ICD? []  [x]    Possible current pregnancy? []  [x]    Is the patient on methotrexate? []  [x]     Tobacco/Marijuana/Snuff/ETOH use: He quit smoking in the 1970's. He has not had anything to drink since diagnosis.   Past/Anticipated interventions by otolaryngology, if any:  08/07/18 Dr. Blenda Nicely, initial consult   10/04/18 Dr. 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 These include initiating treatment with surgery (followed by adjuvant therapy as indicated by pathology results) vs beginning with chemoradiotherapy (with surgery reserved for salvage). Pros and cons of these approaches were discussed.   Surgery in this case would be a transhyoid tongue base resection, bilateral neck dissection, tracheotomy. I don't think he's a good candidate for TORS as the deep extension appears beyond the reach of the instruments. This would require a feeding tube for some time after surgery,  although long-term swallowing results are usually very good. I suspect he'd need at least RT and quite possibly chemoRT after healed from surgery.   He would like to meet with Dr Isidore Moos and her team to discuss chemoRT further and will make a decision after that. If he desires surgery, he will call and we can arrange that over the phone  He is agreeable with this plan. All his questions were answered.    Past/Anticipated interventions by medical oncology, if any:  Dr. Maylon Peppers 10/18/18   Current Complaints / other details:   PET 09/11/18 IMPRESSION: Hypermetabolism along the right base of tongue, corresponding to the patient's known primary oropharyngeal cancer.  Ipsilateral level 2 cervical nodal metastasis.  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

## 2018-10-16 ENCOUNTER — Other Ambulatory Visit: Payer: Self-pay | Admitting: Hematology

## 2018-10-16 ENCOUNTER — Ambulatory Visit (HOSPITAL_COMMUNITY): Payer: Self-pay | Admitting: Dentistry

## 2018-10-16 ENCOUNTER — Telehealth: Payer: Self-pay | Admitting: *Deleted

## 2018-10-16 ENCOUNTER — Encounter (HOSPITAL_COMMUNITY): Payer: Self-pay | Admitting: Dentistry

## 2018-10-16 ENCOUNTER — Other Ambulatory Visit: Payer: Self-pay

## 2018-10-16 VITALS — BP 172/77 | HR 50 | Temp 99.1°F

## 2018-10-16 DIAGNOSIS — K08409 Partial loss of teeth, unspecified cause, unspecified class: Secondary | ICD-10-CM

## 2018-10-16 DIAGNOSIS — Z98811 Dental restoration status: Secondary | ICD-10-CM

## 2018-10-16 DIAGNOSIS — K0530 Chronic periodontitis, unspecified: Secondary | ICD-10-CM

## 2018-10-16 DIAGNOSIS — K06010 Localized gingival recession, unspecified: Secondary | ICD-10-CM

## 2018-10-16 DIAGNOSIS — Z9189 Other specified personal risk factors, not elsewhere classified: Secondary | ICD-10-CM

## 2018-10-16 DIAGNOSIS — M264 Malocclusion, unspecified: Secondary | ICD-10-CM

## 2018-10-16 DIAGNOSIS — K0889 Other specified disorders of teeth and supporting structures: Secondary | ICD-10-CM

## 2018-10-16 DIAGNOSIS — K011 Impacted teeth: Secondary | ICD-10-CM

## 2018-10-16 DIAGNOSIS — K031 Abrasion of teeth: Secondary | ICD-10-CM | POA: Diagnosis not present

## 2018-10-16 DIAGNOSIS — K029 Dental caries, unspecified: Secondary | ICD-10-CM

## 2018-10-16 DIAGNOSIS — C01 Malignant neoplasm of base of tongue: Secondary | ICD-10-CM

## 2018-10-16 DIAGNOSIS — Z972 Presence of dental prosthetic device (complete) (partial): Secondary | ICD-10-CM

## 2018-10-16 DIAGNOSIS — K036 Deposits [accretions] on teeth: Secondary | ICD-10-CM | POA: Diagnosis not present

## 2018-10-16 DIAGNOSIS — Z01818 Encounter for other preprocedural examination: Secondary | ICD-10-CM

## 2018-10-16 NOTE — Telephone Encounter (Signed)
Oncology Nurse Navigator Documentation  Per Dr. Lorette Ang request, called [facility] to request p16 staining on pt's 09/04/2018 pathology.  Spoke with Maudie Mercury, per her guidance sent e-mail with request per new Pathology policy requiring e-mail or fax request.  Gayleen Orem, RN, BSN Head & Neck Oncology Nurse [place] [facility] [place] [phone]

## 2018-10-16 NOTE — Patient Instructions (Signed)

## 2018-10-16 NOTE — Progress Notes (Signed)
[name] CONSULT NOTE  Patient Care Team: Patient, No Pcp Per as PCP - General (General Practice) Eppie Gibson, MD as Attending Physician (Radiation Oncology) Leota Sauers, RN as Oncology Nurse Navigator  HEME/ONC OVERVIEW: 1. 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  Subsequent CT neck showed a mass in the right oral pharyngeal area, concerning for primary malignancy, as well as ipsilateral lymphadenopathy.  He was referred to Dr. Blenda Nicely of ENT, who performed a biopsy of the floor of the mouth that was nondiagnostic.  He then underwent ultrasound-guided biopsy of the right submandibular mass, which showed squamous cell carcinoma, p16 unknown.  He was seen by Dr. Nicolette Bang of ENT at [facility] for TORS, but was not deemed a good candidate due to the deep tumor extension and high likelihood of requiring adjuvant chemoradiation after the surgery.   -I discussed the imaging and pathology results in detail with the patient, as well as with her recent surgery recommendations -I also reviewed with the patient NCCN guidelines in detail -As he is not a candidate for upfront surgical resection, the  standard-of-care approach is to proceed with definitive chemoradiation -Given the patient's advanced age, chronic kidney disease, and his living situation (by himself), I would recommend carboplatin as the radiosensitizer instead of cisplatin  -We discussed the role of chemotherapy. The intent is of curative intent. -We discussed some of the risks, benefits, side-effects of carboplatin -The plan for weekly carboplatin AUC 2 for x 7 doses along with radiation treatment. -Some of the short term side-effects included, though not limited to, including weight loss, life threatening infections, risk of allergic reactions, need for transfusions of blood products, nausea, vomiting, change in bowel habits, loss of hair, admission to hospital for various reasons, and risks of death.  -Long term side-effects are also discussed including risks of infertility, permanent damage to nerve function, hearing loss, chronic fatigue, kidney damage with possibility needing hemodialysis, and rare secondary malignancy including bone marrow disorders. -The patient is aware that the response rates discussed earlier is not guaranteed.  After a long discussion, patient made an informed decision to proceed with the prescribed plan of care.  -Patient education material was dispensed. -In anticipation of chemotherapy, I have ordered port and feeding tube placement -In addition, I have prescribed PRN anti-emetics, including Zofran, Compazine, Ativan, and the dexamethasone -Patient will also require chemotherapy education prior to starting chemoradiation -Date of chemoradiation to be determined, pending pretreatment dental evaluation  Cancer-related pain -Secondary to the SCCa of the tongue and LN involvement -Patient is currently taking Norco but it causes him to have "bad dreams" -I have prescribed IR liquid morphine 10mg /0.79mL q6hrs PRN -I counseled the patient on avoiding taking Norco and morphine  concurrently, as well as the  risk of constipation and the need to be on bowel regimen  AKI vs. CKD  -Baseline Cr 1.2-1.3 (last checked in 2019) -Cr 1.41 today, slightly higher than baseline -I counseled the patient on maintaining adequate hydration, and to avoid nephrotoxic medications, such as NSAIDs -I have also ordered 1L NS to be administered this week -If his Cr remains unchanged at the next visit, then this is most likely progression of his underlying CKD   No orders of the defined types were placed in this encounter.  A total of more than 60 minutes were spent face-to-face with the patient during this encounter and over half of that time was spent on counseling and coordination of care as outlined above.    All questions were answered. The patient knows to call the clinic with any problems, questions or concerns.  1L NS to be administered later this week. Return to clinic to be determined, pending dental evaluation and treatment.   [name], MD 10/18/2018 10:27 AM   CHIEF COMPLAINTS/PURPOSE OF CONSULTATION:  "I am here to find out about chemotherapy"  HISTORY OF PRESENTING ILLNESS:  Neil [name] 73 y.o. 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  Patient first noticed some sore throat in the end of 2019, localized to the right side of the throat, not associated with any dysphagia or odynophagia.  He tried several oral over-the-counter remedies as well as antibiotics without any relief.  He ultimately underwent ultrasound of the neck in late 07/2018, which showed a suspicious mass in the right submandibular area.  CT neck demonstrated a suspicious primary malignancy in the right base of the tongue as well as ipsilateral cervical adenopathy.  He was referred to Dr. Blenda Nicely of ENT, who performed a biopsy of the right floor of the mouth that was nondiagnostic.  He then underwent ultrasound-guided right submandibular mass biopsy, which show squamous cell  carcinoma, p16+.  He was referred to Dr. Nicolette Bang of ENT in late 09/2018 for consideration of tors, but it was deemed poor candidate for upfront resection due to the high likelihood of needing adjuvant chemoradiation.  Patient presents to oncology for further discussion of chemotherapy to be given concurrently with radiation.  Patient reports that he has some pain in the right of the neck and under the chin since early September over 2019, periodically worse without any significant trigger, for which she has tried a variety of supportive cares without any improvement.  He recently saw Dr. Blenda Nicely, who prescribed hydrocodone/acetaminophen, which seemed to help with the pain, but it caused him to have "bad dreams".  He also reports mild odontophagia with swallowing, but denies any significant dysphagia.  He has a history of remote tobacco use for about 5 years, which he quit in the 1970s.  He does not currently drink any alcohol or use illicit drugs.  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  He lives by himself in the retirement home, and his siblings live nearby.  I have reviewed his chart and materials related to his cancer extensively and collaborated history with the patient. Summary of oncologic history is as follows: Oncology History  Cancer of base of tongue ([place])  07/29/2018 Imaging   Neck ultrasound: IMPRESSION: 16 x 30 mm solid mass in the right submandibular area. This may represent a submandibular mass or enlarged lymph node. Recommend CT neck with contrast for further evaluation.   08/03/2018  Imaging   CT neck (at [facility]): IMPRESSION: 1. 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 There is a single ipsilateral malignant lymph node measuring 3 cm. 2. Nodular thickening along the anterior right sublingual gland, attention on follow-up PET.   09/04/2018 Procedure   US-guided bx of the R submandibular  mass   09/04/2018 Pathology Results   Accession: [id number]  Lymph node, needle/core biopsy, right submandibular - SQUAMOUS CELL CARCINOMA. - SEE MICROSCOPIC DESCRIPTION.   09/11/2018 Imaging   PET: IMPRESSION: Hypermetabolism along the right base of tongue, corresponding to the patient's known primary oropharyngeal cancer.   Ipsilateral level 2 cervical nodal metastasis.   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     MEDICAL HISTORY:  Past Medical History:  Diagnosis Date  . Atrial fibrillation ([place])   . Chronic anticoagulation    On Eliquis  . GERD (gastroesophageal reflux disease)   . Hypertension   . Joint pain     SURGICAL HISTORY: Past Surgical History:  Procedure Laterality Date  . HEMORROIDECTOMY  2004  . HERNIA REPAIR  1960  . IR US GUIDANCE  09/04/2018  . IR US GUIDE BX ASP/DRAIN  09/04/2018  . MOUTH BIOPSY  09/04/2018   right jaw  . right testical removed    . TONSILLECTOMY      SOCIAL HISTORY: Social History   Socioeconomic History  . Marital status: Divorced    Spouse name: Not on file  . Number of children: 1  . Years of education: Not on file  . Highest education level: Not on file  Occupational History  . Not on file  Social Needs  . Financial resource strain: Not on file  . Food insecurity    Worry: Not on file    Inability: Not on file  . Transportation needs    Medical: No    Non-medical: No  Tobacco Use  . Smoking status: Former Smoker    Packs/day: 1.00    Years: 5.00    Pack years: 5.00    Quit date: 1970    Years since quitting: 50.6  . Smokeless tobacco: Never Used  Substance and Sexual Activity  . Alcohol use: Not Currently    Comment: none since recent diagnosis.   . Drug use: No  . Sexual activity: Not on file  Lifestyle  . Physical activity    Days per week: Not on file    Minutes per session: Not on file  . Stress: Not on  file  Relationships  . Social Herbalist on phone: Not on file    Gets together: Not on file    Attends religious service: Not on file    Active member of club or organization: Not on file    Attends meetings of clubs or organizations: Not on file    Relationship status: Not on file  . Intimate partner violence    Fear of current or ex partner: No    Emotionally abused: No    Physically abused: No    Forced sexual activity: No  Other Topics Concern  . Not on file  Social History Narrative   Patient is divorced with 1 daughter living in [place].   Patient with history of smoking approximately 1 pack/day for 5 years.  Patient quit in 1970.   Patient has never used smokeless tobacco.   Patient with a  history of drinking wine occasionally.  None since June 2020.   Patient denies use of illicit drugs.    FAMILY HISTORY: History reviewed. No pertinent family history.  ALLERGIES:  is allergic to lisinopril.  MEDICATIONS:  Current Outpatient Medications  Medication Sig Dispense Refill  . amiodarone (PACERONE) 200 MG tablet Take 200 mg by mouth 2 (two) times daily.     Marland Kitchen Apixaban (ELIQUIS PO) Take 5 mg by mouth 2 (two) times daily.     Marland Kitchen co-enzyme Q-10 30 MG capsule Take 30 mg by mouth 2 (two) times daily. 200 mg twice daily    . docusate sodium (COLACE) 100 MG capsule Take 100 mg by mouth as directed. Take 100 mg in AM,  200 mg in PM.    . furosemide (LASIX) 40 MG tablet Take 40 mg by mouth daily.     Marland Kitchen HYDROcodone-acetaminophen (NORCO/VICODIN) 5-325 MG tablet Take 1 tablet by mouth every 4 (four) hours as needed for moderate pain.    . Magnesium Citrate 100 MG TABS Take 1 tablet by mouth daily.    Marland Kitchen omeprazole (PRILOSEC) 20 MG capsule Take 20 mg by mouth daily.    . tamsulosin (FLOMAX) 0.4 MG CAPS capsule Take 0.4 mg by mouth daily after supper.    . Morphine Sulfate (MORPHINE CONCENTRATE) 10 mg / 0.5 ml concentrated solution Take 0.5 mLs (10 mg total) by mouth every 6  (six) hours as needed for severe pain. 90 mL 0   No current facility-administered medications for this visit.     REVIEW OF SYSTEMS:   Constitutional: ( - ) fevers, ( - )  chills , ( - ) night sweats Eyes: ( - ) blurriness of vision, ( - ) double vision, ( - ) watery eyes Ears, nose, mouth, throat, and face: ( - ) mucositis, ( + ) sore throat Respiratory: ( - ) cough, ( - ) dyspnea, ( - ) wheezes Cardiovascular: ( - ) palpitation, ( - ) chest discomfort, ( - ) lower extremity swelling Gastrointestinal:  ( - ) nausea, ( - ) heartburn, ( - ) change in bowel habits Skin: ( - ) abnormal skin rashes Lymphatics: ( + ) lymphadenopathy, ( - ) easy bruising Neurological: ( - ) numbness, ( - ) tingling, ( - ) new weaknesses Behavioral/Psych: ( - ) mood change, ( - ) new changes  All other systems were reviewed with the patient and are negative.  PHYSICAL EXAMINATION: ECOG PERFORMANCE STATUS: 1 - Symptomatic but completely ambulatory  Vitals:   10/18/18 0907  BP: 140/81  Pulse: 63  Resp: 17  Temp: 97.8 F (36.6 C)  SpO2: 97%   Filed Weights   10/18/18 0907  Weight: 259 lb (117.5 kg)    GENERAL: alert, no distress and comfortable SKIN: skin color, texture, turgor are normal, no rashes or significant lesions EYES: conjunctiva are pink and non-injected, sclera clear OROPHARYNX: no exudate, no erythema; lips, buccal mucosa, and tongue normal  NECK: supple, non-tender LYMPH:  ~1.5-2cm right cervical adenopathy near the angle of the jaw LUNGS: clear to auscultation with normal breathing effort HEART: regular rate & rhythm, no murmurs, no lower extremity edema ABDOMEN: soft, non-tender, non-distended, normal bowel sounds Musculoskeletal: no cyanosis of digits and no clubbing  PSYCH: alert & oriented x 3, fluent speech NEURO: no focal motor/sensory deficits  LABORATORY DATA:  I have reviewed the data as listed Lab Results  Component Value Date   WBC 5.4 10/18/2018   HGB 15.7  10/18/2018   HCT 46.9 10/18/2018   MCV 90.5 10/18/2018   PLT 176 10/18/2018   Lab Results  Component Value Date   NA 142 10/18/2018   K 3.7 10/18/2018   CL 107 10/18/2018   CO2 23 10/18/2018    RADIOGRAPHIC STUDIES: I have personally reviewed the radiological images as listed and agreed with the findings in the report.  PATHOLOGY: I have reviewed the pathology reports as documented in the oncologist history.

## 2018-10-16 NOTE — Progress Notes (Signed)
DENTAL CONSULTATION  Date of Consultation:  10/16/2018 Patient Name:   Neil [name] Date of Birth:   July 05, 1945 Medical Record Number: [id number]  COVID 19 SCREENING: The patient does not symptoms concerning for COVID-19 infection (Including fever, chills, cough, or new SHORTNESS OF BREATH).    VITALS: BP (!) 172/77 (BP Location: Right Arm)   Pulse (!) 50   Temp 99.1 F (37.3 C)   CHIEF COMPLAINT: Patient referred by Dr.Zhao and Basilio Cairo for dental consultation.  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  Patient with anticipated chemoradiation therapy.  Patient is now seen as part of a medically necessary pre-chemoradiation therapy dental protocol examination.  The patient currently denies acute toothaches, swellings, or abscesses.  Patient was last seen 4 years ago for a dental extraction of the lower left premolar.  This was at [facility].  There were no complications from the dental extraction.  Approximately 5 years ago the patient had an upper complete and lower acrylic partial denture fabricated at [facility].  Patient indicates that the upper denture and lower acrylic partial denture"fit pretty good".  However, the patient has not had the extracted tooth replaced in the lower acrylic partial denture.  Patient denies having dental phobia.  PROBLEM LIST: Patient Active Problem List   Diagnosis Date Noted  . Cancer of base of tongue (HCC) 10/16/2018    Priority: High    PMH: Past Medical History:  Diagnosis Date  . Atrial fibrillation (HCC)   . Chronic anticoagulation    On Eliquis  . GERD (gastroesophageal reflux disease)   . Hypertension   . Joint pain     PSH: Past Surgical History:  Procedure Laterality Date  . HEMORROIDECTOMY  2004  . HERNIA REPAIR  1960  . IR US GUIDANCE  09/04/2018  . IR US GUIDE BX ASP/DRAIN  09/04/2018  . MOUTH BIOPSY  09/04/2018   right jaw  . right testical removed    . TONSILLECTOMY       ALLERGIES: Allergies  Allergen Reactions  . Lisinopril Cough    MEDICATIONS: Current Outpatient Medications  Medication Sig Dispense Refill  . amiodarone (PACERONE) 200 MG tablet Take 200 mg by mouth 2 (two) times daily.     Marland Kitchen Apixaban (ELIQUIS PO) Take 5 mg by mouth 2 (two) times daily.     Marland Kitchen co-enzyme Q-10 30 MG capsule Take 30 mg by mouth 2 (two) times daily.    . furosemide (LASIX) 40 MG tablet Take 40 mg by mouth daily.     Marland Kitchen HYDROcodone-acetaminophen (NORCO/VICODIN) 5-325 MG tablet Take 1 tablet by mouth every 4 (four) hours as needed for moderate pain.    Marland Kitchen omeprazole (PRILOSEC) 20 MG capsule Take 20 mg by mouth daily.    . tamsulosin (FLOMAX) 0.4 MG CAPS capsule Take 0.4 mg by mouth daily after supper.    . Multiple Vitamin (MULTIVITAMIN WITH MINERALS) TABS tablet Take 1 tablet by mouth daily.     No current facility-administered medications for this visit.     LABS: Lab Results  Component Value Date   WBC 6.4 09/04/2018   HGB 17.0 09/04/2018   HCT 51.3 09/04/2018   MCV 92.9 09/04/2018   PLT 171 09/04/2018      Component Value Date/Time   NA 140 03/25/2015 0628   K 3.9 03/25/2015 0628   CL 108 03/25/2015 0628   CO2 20 (L) 03/25/2015 0628   GLUCOSE 124 (H) 03/25/2015 2025  BUN 15 03/25/2015 0628   CREATININE 0.94 03/25/2015 0628   CALCIUM 9.3 03/25/2015 0628   GFRNONAA >60 03/25/2015 0628   GFRAA >60 03/25/2015 0628   Lab Results  Component Value Date   INR 0.9 09/04/2018   No results found for: PTT  SOCIAL HISTORY: Social History   Socioeconomic History  . Marital status: Divorced    Spouse name: Not on file  . Number of children: 1  . Years of education: Not on file  . Highest education level: Not on file  Occupational History  . Not on file  Social Needs  . Financial resource strain: Not on file  . Food insecurity    Worry: Not on file    Inability: Not on file  . Transportation needs    Medical: Not on file    Non-medical: Not on  file  Tobacco Use  . Smoking status: Former Smoker    Packs/day: 1.00    Years: 5.00    Pack years: 5.00    Quit date: 1970    Years since quitting: 50.6  . Smokeless tobacco: Never Used  Substance and Sexual Activity  . Alcohol use: Not Currently    Comment: occasional  . Drug use: No  . Sexual activity: Not on file  Lifestyle  . Physical activity    Days per week: Not on file    Minutes per session: Not on file  . Stress: Not on file  Relationships  . Social Musician on phone: Not on file    Gets together: Not on file    Attends religious service: Not on file    Active member of club or organization: Not on file    Attends meetings of clubs or organizations: Not on file    Relationship status: Not on file  . Intimate partner violence    Fear of current or ex partner: Not on file    Emotionally abused: Not on file    Physically abused: Not on file    Forced sexual activity: Not on file  Other Topics Concern  . Not on file  Social History Narrative   Patient is divorced with 1 daughter living in [place].   Patient with history of smoking approximately 1 pack/day for 5 years.  Patient quit in 1970.   Patient has never used smokeless tobacco.   Patient with a history of drinking wine occasionally.  None since June 2020.   Patient denies use of illicit drugs.    FAMILY HISTORY: History reviewed. No pertinent family history.  REVIEW OF SYSTEMS: Reviewed with the patient as per History of present illness. Psych: Patient denies having dental phobia.  DENTAL HISTORY: CHIEF COMPLAINT: Patient referred by Dr.Zhao and Basilio Cairo for dental consultation.  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  Patient with anticipated chemoradiation therapy.  Patient is now seen as part of a medically necessary pre-chemoradiation therapy dental protocol examination.  The patient currently denies acute toothaches,  swellings, or abscesses.  Patient was last seen 4 years ago for a dental extraction of the lower left premolar.  This was at [facility].  There were no complications from the dental extraction.  Approximately 5 years ago the patient had an upper complete and lower acrylic partial denture fabricated at [facility].  Patient indicates that the upper denture and lower acrylic partial denture"fit pretty good".  However, the patient has not had the extracted  tooth replaced in the lower acrylic partial denture.  Patient denies having dental phobia.  DENTAL EXAMINATION: GENERAL: The patient is a well-developed, obese male in no acute distress.  Patient walks with the aid of a cane. HEAD AND NECK: Patient has right neck lymphadenopathy.  The patient denies acute TMJ symptoms.  Patient has a maximum inter-incisal opening of greater than 60 mm. INTRAORAL EXAM: Patient has normal saliva.  There is no evidence of oral abscess formation.  There is a palpable right sublingual mass noted in the area of the lower right premolars. DENTITION: Patient has an edentulous maxilla.  Patient has an impacted tooth #17.  Patient also has remaining tooth numbers 18, 28, 29, and 31. PERIODONTAL: The patient has chronic periodontitis with plaque and calculus accumulations, gingival recession, furcation involvement of the molars, and tooth mobility.  There is moderate bone loss noted. DENTAL CARIES/SUBOPTIMAL RESTORATIONS: Caries, flexure lesions, and suboptimal dental restorations are noted. ENDODONTIC: The patient currently denies acute pulpitis symptoms.  I do not see any evidence of periapical pathology. CROWN AND BRIDGE: There are no crown or bridge restorations. PROSTHODONTIC: Patient has an upper complete and a lower acrylic partial denture.  The lower acrylic partial denture is ill fitting.  The maxillary complete denture is stable and retentive. OCCLUSION: The patient is a poor occlusal scheme secondary to multiple missing  teeth, ill fitting acrylic partial denture, and lack replacement of all missing teeth with clinically acceptable dental prostheses.  RADIOGRAPHIC INTERPRETATION: An orthopantogram was taken and supplemented with 5 lower periapical radiographs. There are multiple missing teeth.  There is an impacted tooth #17.  There is moderate bone loss noted.  Dental caries are noted.  There appears to be furcation involvement of the lower molars.   ASSESSMENTS: 1.  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  Pre-chemoradiation therapy dental protocol 3.  Right sublingual mass 4.  Dental caries 5.  Multiple flexure lesions 6.  Chronic periodontitis of bone loss 7.  Gingival recession 8.  Accretions 9.  Furcation involvement of the molars 10.  Tooth mobility 11.  Multiple missing teeth 12.  Ill fitting mandibular acrylic partial denture 13.  Stable and retentive upper complete denture 14.  Poor occlusal scheme and malocclusion 15.  Risk for bleeding with invasive dental procedures due to current Eliquis therapy   PLAN/RECOMMENDATIONS: 1. I discussed the risks, benefits, and complications of various treatment options with the patient in relationship to his medical and dental conditions, current Eliquis therapy, and anticipated chemoradiation therapy and chemoradiation therapy side effects to include xerostomia, radiation caries, trismus, mucositis, taste changes, gum and jawbone changes, and risk for infection and osteoradionecrosis. We discussed various treatment options to include no treatment, total and subtotal extractions with alveoloplasty, pre-prosthetic surgery as indicated, periodontal therapy, dental restorations, root canal therapy, crown and bridge therapy, implant therapy, and replacement of missing teeth as indicated.  We also discussed referral to an oral surgeon for the extractions due to the complexity of the extraction procedures.  We also discussed fabrication of fluoride trays and scatter  protection devices if needed.  The patient currently is leaning towards proceeding with extraction of remaining teeth including impacted tooth #17 with referral to an oral surgeon for evaluation for extractions due to the complexity of the extraction procedures.  I will wait on initial consultation with Dr. Basilio Cairo to determine the anticipated ports and doses and treatment of the right sublingual mass prior to this referral.  Impressions were made today for fabrication  of future fluoride trays and scatter protection devices if needed.  2. Discussion of findings with medical team and coordination of future medical and dental care as needed.  I spent in excess of  135 minutes during the conduct of this consultation and >50% of this time involved direct face-to-face encounter for counseling and/or coordination of the patient's care.    Charlynne Pander, DDS

## 2018-10-17 ENCOUNTER — Encounter: Payer: Self-pay | Admitting: Radiation Oncology

## 2018-10-17 ENCOUNTER — Encounter: Payer: Self-pay | Admitting: *Deleted

## 2018-10-17 ENCOUNTER — Ambulatory Visit
Admission: RE | Admit: 2018-10-17 | Discharge: 2018-10-17 | Disposition: A | Discharge status: Home / Self Care | Payer: Medicare Other | Source: Ambulatory Visit | Attending: Radiation Oncology | Admitting: Radiation Oncology

## 2018-10-17 ENCOUNTER — Ambulatory Visit
Admission: RE | Admit: 2018-10-17 | Discharge: 2018-10-17 | Disposition: A | Discharge status: Home / Self Care | Payer: Medicare Other | Source: Ambulatory Visit | Attending: Hematology | Admitting: Hematology

## 2018-10-17 ENCOUNTER — Other Ambulatory Visit: Payer: Self-pay

## 2018-10-17 DIAGNOSIS — Z87891 Personal history of nicotine dependence: Secondary | ICD-10-CM | POA: Diagnosis not present

## 2018-10-17 DIAGNOSIS — Z510 Encounter for antineoplastic radiation therapy: Secondary | ICD-10-CM | POA: Insufficient documentation

## 2018-10-17 DIAGNOSIS — C01 Malignant neoplasm of base of tongue: Secondary | ICD-10-CM | POA: Diagnosis not present

## 2018-10-17 DIAGNOSIS — C770 Secondary and unspecified malignant neoplasm of lymph nodes of head, face and neck: Secondary | ICD-10-CM | POA: Diagnosis not present

## 2018-10-17 NOTE — Progress Notes (Signed)
Oncology Nurse Navigator Documentation  Joined Neil [name] during tele-consult with Dr. Isidore Moos.    He stated Dr. Enrique Sack has referred him to oral surgeon Owsley for extractions, appt pending. He voiced understanding of:  Plan for 7 weeks M-F XRT which may include concurrent chemotherapy.    Recommendation for placement of PEG if he receives chemotherapy.  Referral to SLP and Nutrition for ancillary support.  CT SIM next step after extractions/during healing period. He confirmed understanding I will join him tomorrow during consult with Dr. Maylon Peppers.  Gayleen Orem, RN, BSN Head & Neck Oncology Nurse [place] at [place] [phone]

## 2018-10-18 ENCOUNTER — Inpatient Hospital Stay: Payer: Medicare Other

## 2018-10-18 ENCOUNTER — Encounter: Payer: Self-pay | Admitting: Hematology

## 2018-10-18 ENCOUNTER — Other Ambulatory Visit: Payer: Self-pay | Admitting: Radiation Oncology

## 2018-10-18 ENCOUNTER — Inpatient Hospital Stay: Payer: Medicare Other | Attending: Hematology | Admitting: Hematology

## 2018-10-18 ENCOUNTER — Encounter: Payer: Self-pay | Admitting: Radiation Oncology

## 2018-10-18 ENCOUNTER — Encounter: Payer: Self-pay | Admitting: *Deleted

## 2018-10-18 ENCOUNTER — Other Ambulatory Visit: Payer: Self-pay

## 2018-10-18 VITALS — BP 140/81 | HR 63 | Temp 97.8°F | Resp 17 | Ht 70.0 in | Wt 259.0 lb

## 2018-10-18 DIAGNOSIS — N189 Chronic kidney disease, unspecified: Secondary | ICD-10-CM | POA: Insufficient documentation

## 2018-10-18 DIAGNOSIS — I4891 Unspecified atrial fibrillation: Secondary | ICD-10-CM | POA: Diagnosis not present

## 2018-10-18 DIAGNOSIS — C01 Malignant neoplasm of base of tongue: Secondary | ICD-10-CM | POA: Insufficient documentation

## 2018-10-18 DIAGNOSIS — R5381 Other malaise: Secondary | ICD-10-CM

## 2018-10-18 DIAGNOSIS — G893 Neoplasm related pain (acute) (chronic): Secondary | ICD-10-CM | POA: Diagnosis not present

## 2018-10-18 DIAGNOSIS — Z1329 Encounter for screening for other suspected endocrine disorder: Secondary | ICD-10-CM

## 2018-10-18 DIAGNOSIS — N183 Chronic kidney disease, stage 3 unspecified: Secondary | ICD-10-CM | POA: Insufficient documentation

## 2018-10-18 DIAGNOSIS — Z7901 Long term (current) use of anticoagulants: Secondary | ICD-10-CM | POA: Insufficient documentation

## 2018-10-18 LAB — CMP (CANCER CENTER ONLY)
ALT: 19 U/L (ref 0–44)
AST: 21 U/L (ref 15–41)
Albumin: 3.9 g/dL (ref 3.5–5.0)
Alkaline Phosphatase: 39 U/L (ref 38–126)
Anion gap: 12 (ref 5–15)
BUN: 21 mg/dL (ref 8–23)
CO2: 23 mmol/L (ref 22–32)
Calcium: 8.9 mg/dL (ref 8.9–10.3)
Chloride: 107 mmol/L (ref 98–111)
Creatinine: 1.41 mg/dL — ABNORMAL HIGH (ref 0.61–1.24)
GFR, Est AFR Am: 56 mL/min — ABNORMAL LOW (ref >60)
GFR, Estimated: 49 mL/min — ABNORMAL LOW (ref >60)
Glucose, Bld: 104 mg/dL — ABNORMAL HIGH (ref 70–99)
Potassium: 3.7 mmol/L (ref 3.5–5.1)
Sodium: 142 mmol/L (ref 135–145)
Total Bilirubin: 0.6 mg/dL (ref 0.3–1.2)
Total Protein: 6.6 g/dL (ref 6.5–8.1)

## 2018-10-18 LAB — CBC WITH DIFFERENTIAL (CANCER CENTER ONLY)
Abs Immature Granulocytes: 0.09 10*3/uL — ABNORMAL HIGH (ref 0.00–0.07)
Basophils Absolute: 0.1 10*3/uL (ref 0.0–0.1)
Basophils Relative: 1 %
Eosinophils Absolute: 0.1 10*3/uL (ref 0.0–0.5)
Eosinophils Relative: 1 %
HCT: 46.9 % (ref 39.0–52.0)
Hemoglobin: 15.7 g/dL (ref 13.0–17.0)
Immature Granulocytes: 2 %
Lymphocytes Relative: 14 %
Lymphs Abs: 0.8 10*3/uL (ref 0.7–4.0)
MCH: 30.3 pg (ref 26.0–34.0)
MCHC: 33.5 g/dL (ref 30.0–36.0)
MCV: 90.5 fL (ref 80.0–100.0)
Monocytes Absolute: 0.5 10*3/uL (ref 0.1–1.0)
Monocytes Relative: 9 %
Neutro Abs: 3.9 10*3/uL (ref 1.7–7.7)
Neutrophils Relative %: 73 %
Platelet Count: 176 10*3/uL (ref 150–400)
RBC: 5.18 MIL/uL (ref 4.22–5.81)
RDW: 13.2 % (ref 11.5–15.5)
WBC Count: 5.4 10*3/uL (ref 4.0–10.5)
nRBC: 0 % (ref 0.0–0.2)

## 2018-10-18 LAB — TSH: TSH: 2.641 u[IU]/mL (ref 0.320–4.118)

## 2018-10-18 LAB — T4, FREE: Free T4: 1.3 ng/dL — ABNORMAL HIGH (ref 0.61–1.12)

## 2018-10-18 MED ORDER — MORPHINE SULFATE (CONCENTRATE) 10 MG /0.5 ML PO SOLN: 10.0000 mg | Freq: Four times a day (QID) | ORAL | 0 refills | Status: DC | PRN

## 2018-10-18 NOTE — Progress Notes (Signed)
Dental Form with Estimates of Radiation Dose      Diagnosis:    ICD-10-CM   1. Cancer of base of tongue ([place])  C01     Prognosis: good  Anticipated # of fractions: 35    Daily?: yes  # of weeks of radiotherapy: 7  Chemotherapy?: possible  Anticipated xerostomia:  Mild permanent   Pre-simulation needs:  Defer to you re: Scatter protection if applicable  Simulation: ASAP   Oher Notes: will spare left easily, should be able to mold 50Gy from right side too.    Please contact Eppie Gibson, MD, with patient's disposition after evaluation and/or dental treatment.

## 2018-10-20 ENCOUNTER — Telehealth: Payer: Self-pay | Admitting: *Deleted

## 2018-10-20 ENCOUNTER — Other Ambulatory Visit: Payer: Self-pay | Admitting: Hematology

## 2018-10-20 DIAGNOSIS — I4891 Unspecified atrial fibrillation: Secondary | ICD-10-CM

## 2018-10-20 NOTE — Telephone Encounter (Signed)
Oncology Nurse Navigator Documentation  Called Mr. Gude to inform referral to [facility] placed by Dr. Maylon Peppers.  He informed he has appt with oral surgeon Owsley next Tues morning.  Gayleen Orem, RN, BSN Head & Neck Oncology Nurse [place] at [place] [phone]

## 2018-10-20 NOTE — Progress Notes (Signed)
Oncology Nurse Navigator Documentation  Met with Neil [name] during initial consult with Dr. Maylon Peppers.  He had Tele-Consult with Dr. Isidore Moos yesterday. . Provided New Patient Information packet: o Contact information for physicians, this navigator, other members of the Care Team o Advance Directive information ([place] blue pamphlet with LCSW insert).    He provided copy of Living Will, HCPOA, HIPPA Authorization and Organ Donation Authorization forms which were later submitted by this navigator to be scanned. o Fall Prevention Patient Safety Plan o Financial Assistance Information sheet o Symptom Management Clinic information o [facility] campus map with highlight of [place], SLP and PT [facility] handouts. . Provided and discussed educational handouts for PEG and PAC, showed him example of PAC. Marland Kitchen Provided further explanation of XRTincluding SIM planning and purpose of Aquaplast head and shoulder mask, showed him example. Marland Kitchen He verbalized understanding: . Dr. Lorette Ang explanation of chemotherapy's role as part of tmt plan. . PEG/PAC placement will be schedule s/p dental extractions. . I will arrange in-person PEG education with him prior to placement. I encouraged him to call me with questions/concerns as he moves forward with appts/procedures.  Gayleen Orem, RN, BSN Head & Neck Oncology Nurse [place] at [place] [phone]

## 2018-10-23 ENCOUNTER — Inpatient Hospital Stay: Payer: Medicare Other

## 2018-10-23 ENCOUNTER — Other Ambulatory Visit: Payer: Self-pay

## 2018-10-23 VITALS — BP 165/83 | HR 53 | Temp 97.8°F

## 2018-10-23 DIAGNOSIS — C01 Malignant neoplasm of base of tongue: Secondary | ICD-10-CM | POA: Diagnosis not present

## 2018-10-23 DIAGNOSIS — G893 Neoplasm related pain (acute) (chronic): Secondary | ICD-10-CM | POA: Diagnosis not present

## 2018-10-23 DIAGNOSIS — N189 Chronic kidney disease, unspecified: Secondary | ICD-10-CM | POA: Diagnosis not present

## 2018-10-23 DIAGNOSIS — Z7901 Long term (current) use of anticoagulants: Secondary | ICD-10-CM | POA: Diagnosis not present

## 2018-10-23 DIAGNOSIS — I4891 Unspecified atrial fibrillation: Secondary | ICD-10-CM | POA: Diagnosis not present

## 2018-10-23 MED ORDER — SODIUM CHLORIDE 0.9 % IV SOLN
Freq: Once | INTRAVENOUS | Status: AC
  Administered 2018-10-23: 09:00:00 via INTRAVENOUS
  Filled 2018-10-23: qty 250

## 2018-10-23 NOTE — Patient Instructions (Signed)

## 2018-10-24 ENCOUNTER — Inpatient Hospital Stay: Payer: Medicare Other | Admitting: Nutrition

## 2018-10-24 DIAGNOSIS — Z23 Encounter for immunization: Secondary | ICD-10-CM | POA: Diagnosis not present

## 2018-10-24 NOTE — Progress Notes (Signed)
Patient is a 73 year old male diagnosed with Tongue Cancer.  He is a patient of Dr. Isidore Moos.  PMH includes HTN, GERD, Afib, and AKI vs CKD.  Medications include CoEnzyme Q, Colace, Lasix, Magnesium Citrate, and Prilosec.  Las include Glucose 104 and Creatinine 1.41  Height: 5\' 10" . Weight: 259 pounds. UBW: 260 pounds. BMI: 37.16.  Patient for PEG after dental extractions. Reports he will be edentulous after he has extractions. He has been trying to eat soft foods. Denies weight loss but verbalizes desire to lose weight. Drinks milk and is willing to try a variety of ONS.  Nutrition Diagnosis: Food and Nutrition Related Knowledge Deficit related to Tongue cancer and associated treatments as evidenced by no prior need for nutrition related information.  Predicted suboptimal energy intake related to tongue cancer as evidenced by need for PEG.  Intervention: Educated patient to consume small, frequent meals and snacks with adequate calories and protein. Try ONS and add between meals when food intake decreases. Discouraged wt loss and explained importance of adequate nutrition intake. Brief discussion on PEG feeding and care. Patient has fact sheets. Questions answered and teach back method used. Contact information provided.  Monitoring, Evaluation, Goals: Tolerate adequate calories and protein for minimal wt loss and adequate healing.  Next Visit: To be scheduled with treatment weekly.

## 2018-10-25 ENCOUNTER — Telehealth: Payer: Self-pay | Admitting: *Deleted

## 2018-10-25 ENCOUNTER — Other Ambulatory Visit: Payer: Self-pay | Admitting: Hematology

## 2018-10-25 NOTE — Telephone Encounter (Addendum)
Oncology Nurse Navigator Documentation  Received call from Neil [name].   He reported:  Started morphine sulfate over the weekend, 0.25 mls BID, started half prescribed dose to see how he wd react.  Continued through yesterday evening but stopped d/t HAs and exacerbated constipation.  Has restarted Norco, taking 1/2 tab twice daily, historically on occasion TID.  He indicated Rx was refilled by ENT [facility] last week, he has 48 tabs.  Started taking [employer]-brand Metamucil and stool softener, now having regular BMs.  Surgery with Dr. Benson Norway scheduled 8/25 11:45, having 5 remaining teeth extracted, 1 of which is impacted.  Follow-up appt scheduled for following week.   I explained:  CT SIM will likely be scheduled 8/28 or beginning of following week, RT wd start after Labor Day pending release from Dr. Benson Norway.  CT SIM appt to be confirmed.  PEG/PAC placement will be scheduled the week of 8/31, appt to be confirmed.  I noted I will schedule PEG education with him in conjunction with CT SIM appt.  Dr. Ree Kida re liquid morphine, Drs. Erasmo Downer, Enrique Sack re oral surgery.  Gayleen Orem, RN, BSN Head & Neck Oncology Nurse [place] at [place] [phone]

## 2018-10-26 ENCOUNTER — Telehealth: Payer: Self-pay | Admitting: *Deleted

## 2018-10-26 ENCOUNTER — Other Ambulatory Visit: Payer: Self-pay | Admitting: Hematology

## 2018-10-26 DIAGNOSIS — C01 Malignant neoplasm of base of tongue: Secondary | ICD-10-CM

## 2018-10-26 NOTE — Progress Notes (Signed)
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

## 2018-10-26 NOTE — Telephone Encounter (Signed)
Oncology Nurse Navigator Documentation  Called Mr. Birks, informed him of 8/28 CT SIM appt:  2:45 IV Start, 3:00 CT SIM.  He voiced understanding.  Gayleen Orem, RN, BSN Head & Neck Oncology Nurse [place] at [place] [phone]

## 2018-10-26 NOTE — Telephone Encounter (Signed)
Oncology Nurse Navigator Documentation  Called Mr. Schlageter, informed him of 9/1 [facility] placement with 12:00 arrival to [facility] IR.  He voiced understanding he will have [place] 7/86 prior to CT [facility], directions to follow.  Gayleen Orem, RN, BSN Head & Neck Oncology Nurse [place] at [place] [phone]

## 2018-10-27 ENCOUNTER — Telehealth: Payer: Self-pay | Admitting: Hematology

## 2018-10-27 ENCOUNTER — Telehealth: Payer: Self-pay | Admitting: *Deleted

## 2018-10-27 NOTE — Telephone Encounter (Signed)
Scheduled appt per 8/20 sch message - pt is aware of appt date and time   

## 2018-10-27 NOTE — Telephone Encounter (Signed)
[place] Work  Clinical Social Work was referred by Pension scheme manager for assessment of psychosocial needs.  Clinical Social Worker contacted patient by phone to offer support and assess for needs.  Neil [name] reported feeling positive about upcoming cancer treatment, mainly due to encouraging treatment team.    Neil [name] lives alone- shared good relationships with family, but ultimately identified himself as his main support.  He expressed his strong faith in God which brings him comfort and peace in difficult times.  CSW and patient discussed strategies for coping when feeling fatigue or other side effects. CSW encouraged patient to call with any questions or concerns.     Gwinda Maine, LCSW  Clinical Social Worker [facility]

## 2018-10-31 ENCOUNTER — Ambulatory Visit: Payer: Medicare Other | Attending: Radiation Oncology

## 2018-10-31 ENCOUNTER — Other Ambulatory Visit: Payer: Self-pay

## 2018-10-31 ENCOUNTER — Encounter: Payer: Self-pay | Admitting: *Deleted

## 2018-10-31 DIAGNOSIS — R1310 Dysphagia, unspecified: Secondary | ICD-10-CM | POA: Diagnosis not present

## 2018-11-02 NOTE — Therapy (Signed)
[facility] 11/02/2018, [place] [address] [place], [place], [address] Phone: [phone]   Fax:  [phone]  Name: Neil [name] MRN: [id number] Date of Birth: 1945/10/28  [place] [address] [place], [place], [address] Phone: [phone]   Fax:  [phone]  Speech Language Pathology Evaluation  Patient Details  Name: Neil [name] MRN: [id number] Date of Birth: [birth year] Referring Provider (SLP): Eppie Gibson, MD   Encounter Date: 10/31/2018  End of Session - 11/02/18 1058    Visit Number  1    Number of Visits  4    Date for SLP Re-Evaluation  01/26/19    SLP Start Time  9    SLP Stop Time   1152    SLP Time Calculation (min)  37 min    Activity Tolerance  Patient tolerated treatment well       Past Medical History:  Diagnosis Date  . Atrial fibrillation ([place])   . Chronic anticoagulation    On Eliquis  . GERD (gastroesophageal reflux disease)   . Hypertension   . Joint pain     Past Surgical History:  Procedure Laterality Date  . HEMORROIDECTOMY  2004  . HERNIA REPAIR  1960  . IR US GUIDANCE  09/04/2018  . IR US GUIDE BX ASP/DRAIN  09/04/2018  . MOUTH BIOPSY  09/04/2018   right jaw  . right testical removed    . TONSILLECTOMY      There were no vitals filed for this visit.  Subjective Assessment - 11/02/18 1050    Subjective  Pt reports he needs to "be careful" and "chew thoroughly".         SLP Evaluation [facility] - 11/02/18 0001      SLP Visit Information   SLP Received On  10/31/18    Referring Provider (SLP)  Eppie Gibson, MD    Onset Date  September 2019    Medical Diagnosis  Base of tongue CA      Subjective   Patient/Family Stated Goal  maintain normal swallowing      Prior Functional Status   Cognitive/Linguistic Baseline  Within functional limits      Oral Motor/Sensory Function   Overall Oral Motor/Sensory Function  Other (comment)    Labial ROM  --   deferred - clinic masking policy, risk of COVID-9 exposure     Motor Speech   Overall Motor Speech  Appears within functional limits for tasks assessed      Pt currently tolerates  mostly regular diet with thin liquids. He is currently NPO due to dental extractions later today. Pt took some small sips with HEP today and no overt s/sx aspiration noted.  Because data states the risk for dysphagia during and after radiation treatment is high due to undergoing radiation tx, SLP taught pt about the possibility of reduced/limited ability for PO intake during rad tx. SLP encouraged pt to continue swallowing POs as far into rad tx as possible, even ingesting POs and/or completing HEP shortly after administration of pain meds.   SLP educated pt re: changes to swallowing musculature after rad tx, and why adherence to dysphagia HEP provided today and PO consumption was necessary to inhibit muscular fibrosis following rad tx. Pt demonstrated understanding of these things to SLP.    SLP then developed a HEP for pt and pt was instructed how to perform exercises involving lingual, vocal, and pharyngeal strengthening. SLP performed each exercise and pt return demonstrated each exercise. SLP ensured pt performance was correct prior to moving on to next exercise. Pt was instructed to complete this program 2 times a day, until 6 months after his last rad  [place] [address] [place], [place], [address] Phone: [phone]   Fax:  [phone]  Speech Language Pathology Evaluation  Patient Details  Name: Neil [name] MRN: [id number] Date of Birth: [birth year] Referring Provider (SLP): Eppie Gibson, MD   Encounter Date: 10/31/2018  End of Session - 11/02/18 1058    Visit Number  1    Number of Visits  4    Date for SLP Re-Evaluation  01/26/19    SLP Start Time  9    SLP Stop Time   1152    SLP Time Calculation (min)  37 min    Activity Tolerance  Patient tolerated treatment well       Past Medical History:  Diagnosis Date  . Atrial fibrillation ([place])   . Chronic anticoagulation    On Eliquis  . GERD (gastroesophageal reflux disease)   . Hypertension   . Joint pain     Past Surgical History:  Procedure Laterality Date  . HEMORROIDECTOMY  2004  . HERNIA REPAIR  1960  . IR US GUIDANCE  09/04/2018  . IR US GUIDE BX ASP/DRAIN  09/04/2018  . MOUTH BIOPSY  09/04/2018   right jaw  . right testical removed    . TONSILLECTOMY      There were no vitals filed for this visit.  Subjective Assessment - 11/02/18 1050    Subjective  Pt reports he needs to "be careful" and "chew thoroughly".         SLP Evaluation [facility] - 11/02/18 0001      SLP Visit Information   SLP Received On  10/31/18    Referring Provider (SLP)  Eppie Gibson, MD    Onset Date  September 2019    Medical Diagnosis  Base of tongue CA      Subjective   Patient/Family Stated Goal  maintain normal swallowing      Prior Functional Status   Cognitive/Linguistic Baseline  Within functional limits      Oral Motor/Sensory Function   Overall Oral Motor/Sensory Function  Other (comment)    Labial ROM  --   deferred - clinic masking policy, risk of COVID-9 exposure     Motor Speech   Overall Motor Speech  Appears within functional limits for tasks assessed      Pt currently tolerates  mostly regular diet with thin liquids. He is currently NPO due to dental extractions later today. Pt took some small sips with HEP today and no overt s/sx aspiration noted.  Because data states the risk for dysphagia during and after radiation treatment is high due to undergoing radiation tx, SLP taught pt about the possibility of reduced/limited ability for PO intake during rad tx. SLP encouraged pt to continue swallowing POs as far into rad tx as possible, even ingesting POs and/or completing HEP shortly after administration of pain meds.   SLP educated pt re: changes to swallowing musculature after rad tx, and why adherence to dysphagia HEP provided today and PO consumption was necessary to inhibit muscular fibrosis following rad tx. Pt demonstrated understanding of these things to SLP.    SLP then developed a HEP for pt and pt was instructed how to perform exercises involving lingual, vocal, and pharyngeal strengthening. SLP performed each exercise and pt return demonstrated each exercise. SLP ensured pt performance was correct prior to moving on to next exercise. Pt was instructed to complete this program 2 times a day, until 6 months after his last rad

## 2018-11-03 ENCOUNTER — Encounter: Payer: Self-pay | Admitting: Radiation Oncology

## 2018-11-03 ENCOUNTER — Other Ambulatory Visit (HOSPITAL_COMMUNITY)
Admission: RE | Admit: 2018-11-03 | Discharge: 2018-11-03 | Disposition: A | Discharge status: Home / Self Care | Payer: Medicare Other | Source: Ambulatory Visit | Attending: Hematology | Admitting: Hematology

## 2018-11-03 ENCOUNTER — Ambulatory Visit
Admission: RE | Admit: 2018-11-03 | Discharge: 2018-11-03 | Disposition: A | Discharge status: Home / Self Care | Payer: Medicare Other | Source: Ambulatory Visit | Attending: Radiation Oncology | Admitting: Radiation Oncology

## 2018-11-03 ENCOUNTER — Other Ambulatory Visit: Payer: Self-pay

## 2018-11-03 DIAGNOSIS — I4891 Unspecified atrial fibrillation: Secondary | ICD-10-CM | POA: Diagnosis not present

## 2018-11-03 DIAGNOSIS — Z510 Encounter for antineoplastic radiation therapy: Secondary | ICD-10-CM | POA: Diagnosis not present

## 2018-11-03 DIAGNOSIS — Z79899 Other long term (current) drug therapy: Secondary | ICD-10-CM | POA: Diagnosis not present

## 2018-11-03 DIAGNOSIS — C01 Malignant neoplasm of base of tongue: Secondary | ICD-10-CM | POA: Diagnosis not present

## 2018-11-03 DIAGNOSIS — I10 Essential (primary) hypertension: Secondary | ICD-10-CM | POA: Diagnosis not present

## 2018-11-03 DIAGNOSIS — Z7901 Long term (current) use of anticoagulants: Secondary | ICD-10-CM | POA: Diagnosis not present

## 2018-11-03 DIAGNOSIS — Z20828 Contact with and (suspected) exposure to other viral communicable diseases: Secondary | ICD-10-CM | POA: Diagnosis not present

## 2018-11-03 DIAGNOSIS — K219 Gastro-esophageal reflux disease without esophagitis: Secondary | ICD-10-CM | POA: Diagnosis not present

## 2018-11-03 LAB — SARS CORONAVIRUS 2 (TAT 6-24 HRS): SARS Coronavirus 2: NEGATIVE

## 2018-11-03 MED ORDER — SODIUM CHLORIDE 0.9% FLUSH
10.0000 mL | Freq: Once | INTRAVENOUS | Status: AC
  Administered 2018-11-03: 10 mL via INTRAVENOUS

## 2018-11-03 NOTE — Progress Notes (Signed)
Has armband been applied?  Yes.    Does patient have an allergy to IV contrast dye?: No.   Has patient ever received premedication for IV contrast dye?: No.   Does patient take metformin?: No.  If patient does take metformin when was the last dose: n/a  Date of lab work: 10/18/2018 BUN: 21 CR: 1.41  IV site: hand right, condition no redness  Has IV site been added to flowsheet?  Yes.

## 2018-11-03 NOTE — Addendum Note (Signed)
Encounter addended by: Heywood Footman, RN on: 11/03/2018 5:05 PM  Actions taken: [facility] administration accepted

## 2018-11-03 NOTE — Addendum Note (Signed)
Encounter addended by: Heywood Footman, RN on: 11/03/2018 5:00 PM  Actions taken: Clinical Note Signed, Order list changed, Diagnosis association updated

## 2018-11-03 NOTE — Progress Notes (Signed)
Head and Neck Cancer Simulation, IMRT treatment planning, and Special treatment procedure note   Outpatient  Diagnosis:    ICD-10-CM   1. 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  All relevant records and images related to the planned course of therapy were reviewed.  The patient freely provided informed written consent to proceed with treatment after reviewing the details related to the planned course of therapy. The consent form was witnessed and verified by the simulation staff.    The patient was laid in the supine position on the table. An Aquaplast head and shoulder mask was custom fitted to the patient's anatomy. High-resolution CT axial imaging was obtained of the head and neck with contrast. I verified that the quality of the imaging is good for treatment planning. 1 Medically Necessary Treatment Device was fabricated and supervised by me: Aquaplast mask.  Treatment planning note I plan to treat the patient with IMRT. I plan to treat the patient's tumor and bilateral neck nodes. I plan to treat to a total dose of 70 Gray in 35  fractions. Dose calculation was ordered from dosimetry.  IMRT planning Note  IMRT is medically necessary and an important modality to deliver adequate dose to the patient's at risk tissues while sparing the patient's normal structures, including the: esophagus, parotid tissue, mandible, brain stem, spinal cord, oral cavity, brachial plexus.  This justifies the use of IMRT in the patient's treatment.   Special Treatment Procedure Note:  The patient will be receiving chemotherapy concurrently. Chemotherapy heightens the risk of side effects. I have considered this during the patient's treatment planning process and will monitor the patient accordingly for side effects on a weekly basis. Concurrent chemotherapy increases the complexity of this patient's treatment and therefore this constitutes a  special treatment procedure.  -----------------------------------  Eppie Gibson, MD

## 2018-11-04 NOTE — Progress Notes (Signed)
Oncology Nurse Navigator Documentation  Met with Neil [name] upon his arrival for [facility] [place].    He was seen SLP Garald Balding.  Spoke with him at end of [facility], addressed questions. I encouraged him to call me with needs/concerns.  Gayleen Orem, RN, BSN Head & Neck Oncology [place] [facility] [place] [phone]

## 2018-11-05 ENCOUNTER — Other Ambulatory Visit: Payer: Self-pay | Admitting: Radiology

## 2018-11-07 ENCOUNTER — Ambulatory Visit (HOSPITAL_COMMUNITY)
Admission: RE | Admit: 2018-11-07 | Discharge: 2018-11-07 | Disposition: A | Discharge status: Home / Self Care | Payer: Medicare Other | Source: Ambulatory Visit | Attending: Hematology | Admitting: Hematology

## 2018-11-07 ENCOUNTER — Encounter (HOSPITAL_COMMUNITY): Payer: Self-pay

## 2018-11-07 ENCOUNTER — Other Ambulatory Visit: Payer: Self-pay

## 2018-11-07 DIAGNOSIS — I10 Essential (primary) hypertension: Secondary | ICD-10-CM | POA: Diagnosis not present

## 2018-11-07 DIAGNOSIS — I4891 Unspecified atrial fibrillation: Secondary | ICD-10-CM | POA: Insufficient documentation

## 2018-11-07 DIAGNOSIS — Z20828 Contact with and (suspected) exposure to other viral communicable diseases: Secondary | ICD-10-CM | POA: Insufficient documentation

## 2018-11-07 DIAGNOSIS — Z7901 Long term (current) use of anticoagulants: Secondary | ICD-10-CM | POA: Diagnosis not present

## 2018-11-07 DIAGNOSIS — Z452 Encounter for adjustment and management of vascular access device: Secondary | ICD-10-CM | POA: Diagnosis not present

## 2018-11-07 DIAGNOSIS — K219 Gastro-esophageal reflux disease without esophagitis: Secondary | ICD-10-CM | POA: Insufficient documentation

## 2018-11-07 DIAGNOSIS — Z79899 Other long term (current) drug therapy: Secondary | ICD-10-CM | POA: Insufficient documentation

## 2018-11-07 DIAGNOSIS — C01 Malignant neoplasm of base of tongue: Secondary | ICD-10-CM | POA: Insufficient documentation

## 2018-11-07 DIAGNOSIS — C109 Malignant neoplasm of oropharynx, unspecified: Secondary | ICD-10-CM | POA: Diagnosis not present

## 2018-11-07 HISTORY — PX: IR GASTROSTOMY TUBE MOD SED: IMG625

## 2018-11-07 HISTORY — PX: IR IMAGING GUIDED PORT INSERTION: IMG5740

## 2018-11-07 LAB — CBC WITH DIFFERENTIAL/PLATELET
Abs Immature Granulocytes: 0.1 10*3/uL — ABNORMAL HIGH (ref 0.00–0.07)
Basophils Absolute: 0.1 10*3/uL (ref 0.0–0.1)
Basophils Relative: 1 %
Eosinophils Absolute: 0.1 10*3/uL (ref 0.0–0.5)
Eosinophils Relative: 1 %
HCT: 50.5 % (ref 39.0–52.0)
Hemoglobin: 16.7 g/dL (ref 13.0–17.0)
Immature Granulocytes: 2 %
Lymphocytes Relative: 12 %
Lymphs Abs: 0.8 10*3/uL (ref 0.7–4.0)
MCH: 30.4 pg (ref 26.0–34.0)
MCHC: 33.1 g/dL (ref 30.0–36.0)
MCV: 92 fL (ref 80.0–100.0)
Monocytes Absolute: 0.4 10*3/uL (ref 0.1–1.0)
Monocytes Relative: 7 %
Neutro Abs: 5.1 10*3/uL (ref 1.7–7.7)
Neutrophils Relative %: 77 %
Platelets: 238 10*3/uL (ref 150–400)
RBC: 5.49 MIL/uL (ref 4.22–5.81)
RDW: 12.8 % (ref 11.5–15.5)
WBC: 6.5 10*3/uL (ref 4.0–10.5)
nRBC: 0 % (ref 0.0–0.2)

## 2018-11-07 LAB — BASIC METABOLIC PANEL
Anion gap: 13 (ref 5–15)
BUN: 14 mg/dL (ref 8–23)
CO2: 21 mmol/L — ABNORMAL LOW (ref 22–32)
Calcium: 8.9 mg/dL (ref 8.9–10.3)
Chloride: 106 mmol/L (ref 98–111)
Creatinine, Ser: 1.24 mg/dL (ref 0.61–1.24)
GFR calc Af Amer: >60 mL/min (ref >60)
GFR calc non Af Amer: 57 mL/min — ABNORMAL LOW (ref >60)
Glucose, Bld: 105 mg/dL — ABNORMAL HIGH (ref 70–99)
Potassium: 4.1 mmol/L (ref 3.5–5.1)
Sodium: 140 mmol/L (ref 135–145)

## 2018-11-07 LAB — PROTIME-INR
INR: 1.1 (ref 0.8–1.2)
Prothrombin Time: 13.8 seconds (ref 11.4–15.2)

## 2018-11-07 LAB — GLUCOSE, CAPILLARY: Glucose-Capillary: 99 mg/dL (ref 70–99)

## 2018-11-07 MED ORDER — FENTANYL CITRATE (PF) 100 MCG/2ML IJ SOLN
INTRAMUSCULAR | Status: AC
  Filled 2018-11-07: qty 2

## 2018-11-07 MED ORDER — CEFAZOLIN SODIUM-DEXTROSE 2-4 GM/100ML-% IV SOLN
2.0000 g | INTRAVENOUS | Status: AC
  Administered 2018-11-07: 2 g via INTRAVENOUS

## 2018-11-07 MED ORDER — ONDANSETRON HCL 4 MG/2ML IJ SOLN: 4.0000 mg | INTRAMUSCULAR | Status: DC | PRN

## 2018-11-07 MED ORDER — HEPARIN SOD (PORK) LOCK FLUSH 100 UNIT/ML IV SOLN
INTRAVENOUS | Status: AC
  Filled 2018-11-07: qty 5

## 2018-11-07 MED ORDER — HEPARIN SOD (PORK) LOCK FLUSH 100 UNIT/ML IV SOLN
INTRAVENOUS | Status: AC | PRN
  Administered 2018-11-07: 500 [IU] via INTRAVENOUS

## 2018-11-07 MED ORDER — GLUCAGON HCL (RDNA) 1 MG IJ SOLR
INTRAMUSCULAR | Status: AC | PRN
  Administered 2018-11-07: 1 mg via INTRAVENOUS

## 2018-11-07 MED ORDER — HYDROCODONE-ACETAMINOPHEN 5-325 MG PO TABS: 1.0000 | ORAL_TABLET | ORAL | Status: DC | PRN

## 2018-11-07 MED ORDER — CEFAZOLIN SODIUM-DEXTROSE 2-4 GM/100ML-% IV SOLN
INTRAVENOUS | Status: AC
  Filled 2018-11-07: qty 100

## 2018-11-07 MED ORDER — HYDROMORPHONE HCL 1 MG/ML IJ SOLN: 1.0000 mg | INTRAMUSCULAR | Status: DC | PRN

## 2018-11-07 MED ORDER — MIDAZOLAM HCL 2 MG/2ML IJ SOLN
INTRAMUSCULAR | Status: AC
  Filled 2018-11-07: qty 2

## 2018-11-07 MED ORDER — LIDOCAINE-EPINEPHRINE 1 %-1:100000 IJ SOLN
INTRAMUSCULAR | Status: AC
  Filled 2018-11-07: qty 1

## 2018-11-07 MED ORDER — LIDOCAINE-EPINEPHRINE (PF) 1 %-1:200000 IJ SOLN
INTRAMUSCULAR | Status: AC | PRN
  Administered 2018-11-07: 10 mL

## 2018-11-07 MED ORDER — FENTANYL CITRATE (PF) 100 MCG/2ML IJ SOLN
INTRAMUSCULAR | Status: AC | PRN
  Administered 2018-11-07 (×4): 50 ug via INTRAVENOUS

## 2018-11-07 MED ORDER — MIDAZOLAM HCL 2 MG/2ML IJ SOLN
INTRAMUSCULAR | Status: AC | PRN
  Administered 2018-11-07 (×5): 1 mg via INTRAVENOUS

## 2018-11-07 MED ORDER — IOHEXOL 300 MG/ML  SOLN
50.0000 mL | Freq: Once | INTRAMUSCULAR | Status: AC | PRN
  Administered 2018-11-07: 10 mL via ORAL

## 2018-11-07 MED ORDER — SODIUM CHLORIDE 0.9 % IV SOLN
INTRAVENOUS | Status: DC
  Administered 2018-11-07: 13:00:00 via INTRAVENOUS

## 2018-11-07 MED ORDER — LIDOCAINE HCL 1 % IJ SOLN
INTRAMUSCULAR | Status: AC
  Filled 2018-11-07: qty 20

## 2018-11-07 MED ORDER — GLUCAGON HCL RDNA (DIAGNOSTIC) 1 MG IJ SOLR
INTRAMUSCULAR | Status: AC
  Filled 2018-11-07: qty 1

## 2018-11-07 MED ORDER — MIDAZOLAM HCL 2 MG/2ML IJ SOLN
INTRAMUSCULAR | Status: AC
  Filled 2018-11-07: qty 4

## 2018-11-07 NOTE — Discharge Instructions (Signed)
Gastrostomy Tube Home Guide, Adult °A gastrostomy tube, or G-tube, is a tube that is inserted through the abdomen into the stomach. The tube is used to give feedings and medicines when a person is unable to eat and drink enough on his or her own. °How to care for a G-tube °Supplies needed °· Saline solution or clean, warm water and soap. °· Cotton swab or gauze. °· Precut gauze bandage (dressing) and tape, if needed. °Instructions °1. Wash your hands with soap and water. °2. If there is a dressing between the person's skin and the tube, remove it. °3. Check the area where the tube enters the skin. Check for problems such as: °? Redness. °? Swelling. °? Pus-like drainage. °? Extra skin growth. °4. Moisten the cotton swab with the saline solution or soap and water mixture. Gently clean around the insertion site. Remove any drainage or crusting. °? When the G-tube is first put in, a normal saline solution or water can be used to clean the skin. °? Mild soap and warm water can be used when the skin around the G-tube site has healed. °5. If there should be a dressing between the person's skin and the tube, apply it at this time. °How to flush a G-tube °Flush the G-tube regularly to keep it from clogging. Flush it before and after feedings and as often as told by the health care provider. °Supplies needed °· Purified or sterile water, warmed. If the person has a weak disease-fighting (immune) system, or if he or she has difficulty fighting off infections (is immunocompromised), use only sterile water. °? If you are unsure about the amount of chemical contaminants in purified or drinking water, use sterile water. °? To purify drinking water by boiling: °§ Boil water for at least 1 minute. Keep lid over water while it boils. Allow water to cool to room temperature before using. °· 60cc G-tube syringe. °Instructions °1. Wash your hands with soap and water. °2. Draw up 30 mL of warm water in a syringe. °3. Connect the syringe  to the tube. °4. Slowly and gently push the water into the tube. °G-tube problems and solutions °· If the tube comes out: °? Cover the opening with a clean dressing and tape. °? Call a health care provider right away. °? A health care provider will need to put the tube back in within 4 hours. °· If there is skin or scar tissue growing where the tube enters the skin: °? Keep the area clean and dry. °? Secure the tube with tape so that the tube does not move around too much. °? Call a health care provider. °· If the tube gets clogged: °? Slowly push warm water into the tube with a large syringe. °? Do not force the fluid into the tube or push an object into the tube. °? If you are not able to unclog the tube, call a health care provider right away. °Follow these instructions at home: °Feedings °· Give feedings at room temperature. °· Cover and place unused feedings in the refrigerator. °· If feedings are continuous: °? Do not put more than 4 hours worth of feedings in the feeding bag. °? Stop the feedings when you need to give medicine or flush the tube. Be sure to restart the feedings. °? Make sure the person's head is above his or her stomach (upright position). This will prevent choking and discomfort. °· Replace feeding bags and syringes as told by the health care provider. °· Make   sure the person is in the right position during and after feedings: ? During feedings, the person's position should be in the upright position. ? After a noncontinuous feeding (bolus feeding), have the person stay in the upright position for 1 hour. General instructions  Only use syringes made for G-tubes.  Do not pull or put tension on the tube.  Clamp the tube before removing the cap or disconnecting a syringe.  Measure the length of the G-tube every day from the insertion site to the end of the tube.  If the person's G-tube has a balloon, check the fluid in the balloon every week. The amount of fluid that should be in  the balloon can be found in the manufacturers specifications.  Make sure the person takes care of his or her oral health, such as by brushing his or her teeth.  Remove excess air from the G-tube as told by the person's health care provider. This is called "venting."  Keep the area where the tube enters the skin clean and dry.  Do not push feedings, medicines, or flushes rapidly. Contact a health care provider if:  The person with the tube has any of these problems: ? Constipation. ? Fever.  There is a large amount of fluid or mucus-like liquid leaking from the tube.  Skin or scar tissue appears to be growing where the tube enters the skin.  The length of tube from the insertion site to the G-tube gets longer. Get help right away if:  The person with the tube has any of these problems: ? Severe abdominal pain. ? Severe tenderness. ? Severe bloating. ? Nausea. ? Vomiting. ? Trouble breathing. ? Shortness of breath.  Any of these problems happen in the area where the tube enters the skin: ? Redness, irritation, swelling, or soreness. ? Pus-like discharge. ? A bad smell.  The tube is clogged and cannot be flushed.  The tube comes out. Summary  A gastrostomy tube, or G-tube, is a tube that is inserted through the abdomen into the stomach. The tube is used to give feedings and medicines when a person is unable to eat and drink enough on his or her own.  Check and clean the insertion site daily as told by the person's health care provider.  Flush the G-tube regularly to keep it from clogging. Flush it before and after feedings and as often as told by the person's health care provider.  Keep the area where the tube enters the skin clean and dry. This information is not intended to replace advice given to you by your health care provider. Make sure you discuss any questions you have with your health care provider. Document Released: 05/03/2001 Document Revised: 02/04/2017  Document Reviewed: 04/19/2016 Elsevier Patient Education  [place] Insertion, Care After This sheet gives you information about how to care for yourself after your procedure. Your health care provider may also give you more specific instructions. If you have problems or questions, contact your health care provider. What can I expect after the procedure? After the procedure, it is common to have:  Discomfort at the port insertion site.  Bruising on the skin over the port. This should improve over 3-4 days. Follow these instructions at home: [facility] care  After your port is placed, you will get a manufacturer's information card. The card has information about your port. Keep this card with you at all times.  Take care of the port as  told by your health care provider. Ask your health care provider if you or a family member can get training for taking care of the port at home. A home health care nurse may also take care of the port.  Make sure to remember what type of port you have. Incision care      Follow instructions from your health care provider about how to take care of your port insertion site. Make sure you: ? Wash your hands with soap and water before and after you change your bandage (dressing). If soap and water are not available, use hand sanitizer. ? Change your dressing as told by your health care provider. ? Leave stitches (sutures), skin glue, or adhesive strips in place. These skin closures may need to stay in place for 2 weeks or longer. If adhesive strip edges start to loosen and curl up, you may trim the loose edges. Do not remove adhesive strips completely unless your health care provider tells you to do that.  Check your port insertion site every day for signs of infection. Check for: ? Redness, swelling, or pain. ? Fluid or blood. ? Warmth. ? Pus or a bad smell. Activity  Return to your normal activities as told by your health care  provider. Ask your health care provider what activities are safe for you.  Do not lift anything that is heavier than 10 lb (4.5 kg), or the limit that you are told, until your health care provider says that it is safe. General instructions  Take over-the-counter and prescription medicines only as told by your health care provider.  Do not take baths, swim, or use a hot tub until your health care provider approves. Ask your health care provider if you may take showers. You may only be allowed to take sponge baths.  Do not drive for 24 hours if you were given a sedative during your procedure.  Wear a medical alert bracelet in case of an emergency. This will tell any health care providers that you have a port.  Keep all follow-up visits as told by your health care provider. This is important. Contact a health care provider if:  You cannot flush your port with saline as directed, or you cannot draw blood from the port.  You have a fever or chills.  You have redness, swelling, or pain around your port insertion site.  You have fluid or blood coming from your port insertion site.  Your port insertion site feels warm to the touch.  You have pus or a bad smell coming from the port insertion site. Get help right away if:  You have chest pain or shortness of breath.  You have bleeding from your port that you cannot control. Summary  Take care of the port as told by your health care provider. Keep the manufacturer's information card with you at all times.  Change your dressing as told by your health care provider.  Contact a health care provider if you have a fever or chills or if you have redness, swelling, or pain around your port insertion site.  Keep all follow-up visits as told by your health care provider. This information is not intended to replace advice given to you by your health care provider. Make sure you discuss any questions you have with your health care  provider. Document Released: 12/13/2012 Document Revised: 09/20/2017 Document Reviewed: 09/20/2017 Elsevier Patient Education  [place].  Moderate Conscious Sedation, Adult, Care After These instructions provide you with  information about caring for yourself after your procedure. Your health care provider may also give you more specific instructions. Your treatment has been planned according to current medical practices, but problems sometimes occur. Call your health care provider if you have any problems or questions after your procedure. What can I expect after the procedure? After your procedure, it is common:  To feel sleepy for several hours.  To feel clumsy and have poor balance for several hours.  To have poor judgment for several hours.  To vomit if you eat too soon. Follow these instructions at home: For at least 24 hours after the procedure:   Do not: ? Participate in activities where you could fall or become injured. ? Drive. ? Use heavy machinery. ? Drink alcohol. ? Take sleeping pills or medicines that cause drowsiness. ? Make important decisions or sign legal documents. ? Take care of children on your own.  Rest. Eating and drinking  Follow the diet recommended by your health care provider.  If you vomit: ? Drink water, juice, or soup when you can drink without vomiting. ? Make sure you have little or no nausea before eating solid foods. General instructions  Have a responsible adult stay with you until you are awake and alert.  Take over-the-counter and prescription medicines only as told by your health care provider.  If you smoke, do not smoke without supervision.  Keep all follow-up visits as told by your health care provider. This is important. Contact a health care provider if:  You keep feeling nauseous or you keep vomiting.  You feel light-headed.  You develop a rash.  You have a fever. Get help right away if:  You have trouble  breathing. This information is not intended to replace advice given to you by your health care provider. Make sure you discuss any questions you have with your health care provider. Document Released: 12/13/2012 Document Revised: 02/04/2017 Document Reviewed: 06/14/2015 Elsevier Patient Education  2020 [employer].

## 2018-11-07 NOTE — Discharge Instructions (Signed)
Moderate Conscious Sedation, Adult, Care After These instructions provide you with information about caring for yourself after your procedure. Your health care provider may also give you more specific instructions. Your treatment has been planned according to current medical practices, but problems sometimes occur. Call your health care provider if you have any problems or questions after your procedure. What can I expect after the procedure? After your procedure, it is common:  To feel sleepy for several hours.  To feel clumsy and have poor balance for several hours.  To have poor judgment for several hours.  To vomit if you eat too soon. Follow these instructions at home: For at least 24 hours after the procedure:   Do not: ? Participate in activities where you could fall or become injured. ? Drive. ? Use heavy machinery. ? Drink alcohol. ? Take sleeping pills or medicines that cause drowsiness. ? Make important decisions or sign legal documents. ? Take care of children on your own.  Rest. Eating and drinking  Follow the diet recommended by your health care provider.  If you vomit: ? Drink water, juice, or soup when you can drink without vomiting. ? Make sure you have little or no nausea before eating solid foods. General instructions  Have a responsible adult stay with you until you are awake and alert.  Take over-the-counter and prescription medicines only as told by your health care provider.  If you smoke, do not smoke without supervision.  Keep all follow-up visits as told by your health care provider. This is important. Contact a health care provider if:  You keep feeling nauseous or you keep vomiting.  You feel light-headed.  You develop a rash.  You have a fever. Get help right away if:  You have trouble breathing. This information is not intended to replace advice given to you by your health care provider. Make sure you discuss any questions you have  with your health care provider. Document Released: 12/13/2012 Document Revised: 02/04/2017 Document Reviewed: 06/14/2015 Elsevier Patient Education  [place].  Gastrostomy Tube Home Guide, Adult A gastrostomy tube, or G-tube, is a tube that is inserted through the abdomen into the stomach. The tube is used to give feedings and medicines when a person is unable to eat and drink enough on his or her own. How to care for a G-tube Supplies needed  Saline solution or clean, warm water and soap.  Cotton swab or gauze.  Precut gauze bandage (dressing) and tape, if needed. Instructions 1. Wash your hands with soap and water. 2. If there is a dressing between the person's skin and the tube, remove it. 3. Check the area where the tube enters the skin. Check for problems such as: ? Redness. ? Swelling. ? Pus-like drainage. ? Extra skin growth. 4. Moisten the cotton swab with the saline solution or soap and water mixture. Gently clean around the insertion site. Remove any drainage or crusting. ? When the G-tube is first put in, a normal saline solution or water can be used to clean the skin. ? Mild soap and warm water can be used when the skin around the G-tube site has healed. 5. If there should be a dressing between the person's skin and the tube, apply it at this time. How to flush a G-tube Flush the G-tube regularly to keep it from clogging. Flush it before and after feedings and as often as told by the health care provider. Supplies needed  Purified or sterile water, warmed.  If the person has a weak disease-fighting (immune) system, or if he or she has difficulty fighting off infections (is immunocompromised), use only sterile water. ? If you are unsure about the amount of chemical contaminants in purified or drinking water, use sterile water. ? To purify drinking water by boiling:  Boil water for at least 1 minute. Keep lid over water while it boils. Allow water to cool to room  temperature before using.  60cc G-tube syringe. Instructions 1. Wash your hands with soap and water. 2. Draw up 30 mL of warm water in a syringe. 3. Connect the syringe to the tube. 4. Slowly and gently push the water into the tube. G-tube problems and solutions  If the tube comes out: ? Cover the opening with a clean dressing and tape. ? Call a health care provider right away. ? A health care provider will need to put the tube back in within 4 hours.  If there is skin or scar tissue growing where the tube enters the skin: ? Keep the area clean and dry. ? Secure the tube with tape so that the tube does not move around too much. ? Call a health care provider.  If the tube gets clogged: ? Slowly push warm water into the tube with a large syringe. ? Do not force the fluid into the tube or push an object into the tube. ? If you are not able to unclog the tube, call a health care provider right away. Follow these instructions at home: Feedings  Give feedings at room temperature.  Cover and place unused feedings in the refrigerator.  If feedings are continuous: ? Do not put more than 4 hours worth of feedings in the feeding bag. ? Stop the feedings when you need to give medicine or flush the tube. Be sure to restart the feedings. ? Make sure the person's head is above his or her stomach (upright position). This will prevent choking and discomfort.  Replace feeding bags and syringes as told by the health care provider.  Make sure the person is in the right position during and after feedings: ? During feedings, the person's position should be in the upright position. ? After a noncontinuous feeding (bolus feeding), have the person stay in the upright position for 1 hour. General instructions  Only use syringes made for G-tubes.  Do not pull or put tension on the tube.  Clamp the tube before removing the cap or disconnecting a syringe.  Measure the length of the G-tube every  day from the insertion site to the end of the tube.  If the person's G-tube has a balloon, check the fluid in the balloon every week. The amount of fluid that should be in the balloon can be found in the manufacturers specifications.  Make sure the person takes care of his or her oral health, such as by brushing his or her teeth.  Remove excess air from the G-tube as told by the person's health care provider. This is called "venting."  Keep the area where the tube enters the skin clean and dry.  Do not push feedings, medicines, or flushes rapidly. Contact a health care provider if:  The person with the tube has any of these problems: ? Constipation. ? Fever.  There is a large amount of fluid or mucus-like liquid leaking from the tube.  Skin or scar tissue appears to be growing where the tube enters the skin.  The length of tube from the insertion site  to the G-tube gets longer. Get help right away if:  The person with the tube has any of these problems: ? Severe abdominal pain. ? Severe tenderness. ? Severe bloating. ? Nausea. ? Vomiting. ? Trouble breathing. ? Shortness of breath.  Any of these problems happen in the area where the tube enters the skin: ? Redness, irritation, swelling, or soreness. ? Pus-like discharge. ? A bad smell.  The tube is clogged and cannot be flushed.  The tube comes out. Summary  A gastrostomy tube, or G-tube, is a tube that is inserted through the abdomen into the stomach. The tube is used to give feedings and medicines when a person is unable to eat and drink enough on his or her own. May start feedings or meds via Gtube 11/08/18 as necessary.  Check and clean the insertion site daily starting 11/08/18 as told by the person's health care provider.  Flush the G-tube regularly to keep it from clogging. Flush it before and after feedings or every 12 hours if not in use. Keep port capped when not in use.  Keep the area where the tube enters the  skin clean and dry.  Liquids by mouth tonite (avoid carbonated beverages), May have puree by mouth starting in am and advance as tolerated for comfort.   Interventional Radiology on call doctor for Pain, distention, vomitting or aspiration.  Keep head elevated through the evening. This information is not intended to replace advice given to you by your health care provider. Make sure you discuss any questions you have with your health care provider. Document Released: 05/03/2001 Document Revised: 02/04/2017 Document Reviewed: 04/19/2016 Elsevier Patient Education  [place], Care After This sheet gives you information about how to care for yourself after your procedure. Your health care provider may also give you more specific instructions. If you have problems or questions, contact your health care provider. What can I expect after the procedure? After the procedure, it is common to have:  Discomfort at the port insertion site.  Bruising on the skin over the port. This should improve over 3-4 days. Follow these instructions at home: [facility] care  After your port is placed, you will get a manufacturer's information card. The card has information about your port. Keep this card with you at all times.  Take care of the port as told by your health care provider. Ask your health care provider if you or a family member can get training for taking care of the port at home. A home health care nurse may also take care of the port.  Make sure to remember what type of port you have. Incision care  Follow instructions from your health care provider about how to take care of your port insertion site. Make sure you: ? Wash your hands with soap and water before and after you change your bandage (dressing). If soap and water are not available, use hand sanitizer. ? Change your dressing as told by your health care provider. May remove dressing and shower or bathe in  24 to 48 hours.  Keep site clean and dry (wash daily),apply bandaid as necessary.   ? Leave stitches (sutures), skin glue, or adhesive strips in place. These skin closures may need to stay in place for 2 weeks or longer. If adhesive strip edges start to loosen and curl up, you may trim the loose edges. Do not remove adhesive strips completely unless your health care provider tells you to  do that.  Check your port insertion site every day for signs of infection. Check for: ? Redness, swelling, or pain. ? Fluid or blood. ? Warmth. ? Pus or a bad smell. Activity  Return to your normal activities as told by your health care provider. Ask your health care provider what activities are safe for you.  Do not lift anything that is heavier than 10 lb (4.5 kg), or the limit that you are told, until your health care provider says that it is safe. General instructions  Take over-the-counter and prescription medicines only as told by your health care provider.  Do not take baths, swim, or use a hot tub until your health care provider approves. Ask your health care provider if you may take showers. You may only be allowed to take sponge baths.  Do not drive for 24 hours if you were given a sedative during your procedure.  Wear a medical alert bracelet in case of an emergency. This will tell any health care providers that you have a port.  Keep all follow-up visits as told by your health care provider. This is important. Contact a health care provider if:  You cannot flush your port with saline as directed, or you cannot draw blood from the port.  You have a fever or chills.  You have redness, swelling, or pain around your port insertion site.  You have fluid or blood coming from your port insertion site.  Your port insertion site feels warm to the touch.  You have pus or a bad smell coming from the port insertion site. Get help right away if:  You have chest pain or shortness of  breath.  You have bleeding from your port that you cannot control. Summary  Take care of the port as told by your health care provider. Keep the manufacturer's information card with you at all times.  Change your dressing as told by your health care provider.  Contact a health care provider if you have a fever or chills or if you have redness, swelling, or pain around your port insertion site.  Keep all follow-up visits as told by your health care provider.  Do not start EMLA cream (if ordered by your provider) for 2 weeks post port insertion. This information is not intended to replace advice given to you by your health care provider. Make sure you discuss any questions you have with your health care provider. Document Released: 12/13/2012 Document Revised: 09/20/2017 Document Reviewed: 09/20/2017 Elsevier Patient Education  [place].

## 2018-11-07 NOTE — H&P (Signed)
Referring Physician(s): Neil [name],Neil [name]  Supervising Physician: Neil [name]  Patient Status:  [facility]  Chief Complaint:  "I'm here for a stomach tube and Port-A-Cath"  Subjective: Patient familiar to IR service from right submandibular mass and right thyroid nodule biopsies on 09/04/2018.  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  He currently denies fever, chest pain, abdominal/back pain, nausea, vomiting or bleeding.  He does have dysphagia, occasional headaches, some dyspnea with exertion, occasional cough and constipation. Additional PMH as below.  Past Medical History:  Diagnosis Date  . Atrial fibrillation ([place])   . Chronic anticoagulation    On Eliquis  . GERD (gastroesophageal reflux disease)   . Hypertension   . Joint pain    Past Surgical History:  Procedure Laterality Date  . HEMORROIDECTOMY  2004  . HERNIA REPAIR  1960  . IR US GUIDANCE  09/04/2018  . IR US GUIDE BX ASP/DRAIN  09/04/2018  . MOUTH BIOPSY  09/04/2018   right jaw  . right testical removed    . TONSILLECTOMY        Allergies: Lisinopril  Medications: Prior to Admission medications   Medication Sig Start Date End Date Taking? Authorizing Provider  amiodarone (PACERONE) 200 MG tablet Take 200 mg by mouth 2 (two) times daily.     [provider]  Apixaban (ELIQUIS PO) Take 5 mg by mouth 2 (two) times daily.     [provider]  co-enzyme Q-10 30 MG capsule Take 30 mg by mouth 2 (two) times daily. 200 mg twice daily    [provider]  docusate sodium (COLACE) 100 MG capsule Take 100 mg by mouth as directed. Take 100 mg in AM,  200 mg in PM.    [provider]  furosemide (LASIX) 40 MG tablet Take 40 mg by mouth daily.     [provider]  HYDROcodone-acetaminophen (NORCO/VICODIN) 5-325 MG tablet Take 1 tablet  by mouth every 4 (four) hours as needed for moderate pain.    [provider]  Magnesium Citrate 100 MG TABS Take 1 tablet by mouth daily.    [provider]  omeprazole (PRILOSEC) 20 MG capsule Take 20 mg by mouth daily.    [provider]  tamsulosin (FLOMAX) 0.4 MG CAPS capsule Take 0.4 mg by mouth daily after supper.    [provider]     Vital Signs: BP (!) 163/77   Pulse (!) 56   Temp 98.5 F (36.9 C) (Oral)   Resp 18   SpO2 100%   Physical Exam awake, alert.  Chest with distant breath sounds bilaterally.  Heart with slightly bradycardic rate with ectopy noted; abdomen obese, soft, positive bowel sounds, nontender.  Trace pretibial edema bilaterally.  Imaging: No results found.  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  Details/risks of procedures, including but not limited to, internal bleeding, infection, injury to adjacent structures discussed with patient with his understanding and consent. Last dose of eliquis was on 8/29.   Electronically Signed: D. Rowe Robert, PA-C 11/07/2018, 1:03 PM   I spent a total of 30 minutes at the the patient's bedside AND on the patient's hospital floor or unit, greater than 50% of which was counseling/coordinating care for Port-A-Cath and  gastrostomy tube placements

## 2018-11-07 NOTE — Procedures (Signed)
Interventional Radiology Procedure Note  Procedure:   1.) Right IJ SL PowerPort placement.  Catheter tip at cavoatrial jxn and ready for use.  2.) Placement of percutaneous 35F pull-through gastrostomy tube.  Complications: None  Recommendations: - NPO except for sips and chips remainder of today and overnight - Maintain G-tube to LWS until tomorrow morning  - May advance diet as tolerated and begin using tube tomorrow morning  Signed,  Criselda Peaches, MD

## 2018-11-14 DIAGNOSIS — Z510 Encounter for antineoplastic radiation therapy: Secondary | ICD-10-CM | POA: Insufficient documentation

## 2018-11-14 DIAGNOSIS — C01 Malignant neoplasm of base of tongue: Secondary | ICD-10-CM | POA: Diagnosis not present

## 2018-11-14 NOTE — Progress Notes (Signed)
Cardiology Office Note:   Date:  11/15/2018  NAME:  Neil [name]    MRN: [id number] DOB:  1945/08/17   PCP:  Verlon Au, MD  Cardiologist:  Reatha Harps, MD  Electrophysiologist:  None   Referring MD: Arthur Holms, MD   Chief Complaint  Patient presents with   Atrial Fibrillation   History of Present Illness:   Neil [name] is a 73 y.o. male with a hx of squamous cell carcinoma of the tongue who is being seen today for the evaluation of atrial fibrillation at the request of Arthur Holms, MD. he reports being diagnosed with atrial fibrillation 3 to 4 years ago.  He was predominantly cared for at [facility].  He reports 3-4 attempts of cardioversion, that were unsuccessful.  He states he has been on amiodarone 200 mg daily for quite some time.  He reports no prior history of coronary artery disease, heart failure, or any other cardiac disease.  He has elevated high blood pressure today, that is not appropriately treated.  He is taking apixaban and has no issues with bleeding.  He reports since his recent diagnosis of squamous cell carcinoma of the tongue, and his great care at the cancer center at [facility] health he would like to transition his cardiac care here.  His EKG demonstrates atrial fibrillation.  He reports no cardiovascular symptoms today.  He does report he does all activities of daily living without any limitations.  He reports occasional shortness of breath, but no overt cardiac symptoms.  Overall, he is doing quite well.  Regarding his atrial fibrillation, he has been followed by Dr. Sampson Goon with [facility].  He was first diagnosed in June 2018, and was converted to normal sinus rhythm.  He is maintained on amiodarone and appears.  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  Past Medical History: Past Medical History:  Diagnosis Date   Atrial fibrillation (HCC)    Chronic anticoagulation    On Eliquis   GERD (gastroesophageal reflux  disease)    Hypertension    Joint pain     Past Surgical History: Past Surgical History:  Procedure Laterality Date   HEMORROIDECTOMY  2004   HERNIA REPAIR  1960   IR GASTROSTOMY TUBE MOD SED  11/07/2018   IR IMAGING GUIDED PORT INSERTION  11/07/2018   IR US GUIDANCE  09/04/2018   IR US GUIDE BX ASP/DRAIN  09/04/2018   MOUTH BIOPSY  09/04/2018   right jaw   right testical removed     TONSILLECTOMY      Current Medications: Current Meds  Medication Sig   Apixaban (ELIQUIS PO) Take 5 mg by mouth 2 (two) times daily.    co-enzyme Q-10 30 MG capsule Take 30 mg by mouth 2 (two) times daily. 200 mg twice daily   docusate sodium (COLACE) 100 MG capsule Take 100 mg by mouth as directed. Take 100 mg in AM,  200 mg in PM.   ELIQUIS 5 MG TABS tablet Take 5 mg by mouth 2 (two) times daily.   HYDROcodone-acetaminophen (NORCO/VICODIN) 5-325 MG tablet Take 1 tablet by mouth every 4 (four) hours as needed for moderate pain.   Magnesium Citrate 100 MG TABS Take 1 tablet by mouth daily.   omeprazole (PRILOSEC) 20 MG capsule Take 20 mg by mouth daily.   tamsulosin (FLOMAX) 0.4 MG CAPS capsule Take 0.4 mg by mouth daily after supper.   [DISCONTINUED] amiodarone (PACERONE) 200 MG tablet  Take 200 mg by mouth 2 (two) times daily.    [DISCONTINUED] furosemide (LASIX) 40 MG tablet Take 40 mg by mouth daily.      Allergies:    Lisinopril   Social History: Social History   Socioeconomic History   Marital status: Divorced    Spouse name: Not on file   Number of children: 1   Years of education: Not on file   Highest education level: Not on file  Occupational History   Not on file  Social Needs   Financial resource strain: Not on file   Food insecurity    Worry: Not on file    Inability: Not on file   Transportation needs    Medical: No    Non-medical: No  Tobacco Use   Smoking status: Former Smoker    Packs/day: 1.00    Years: 5.00    Pack years: 5.00    Quit  date: 1970    Years since quitting: 50.7   Smokeless tobacco: Never Used  Substance and Sexual Activity   Alcohol use: Not Currently    Comment: none since recent diagnosis.    Drug use: No   Sexual activity: Not on file  Lifestyle   Physical activity    Days per week: Not on file    Minutes per session: Not on file   Stress: Not on file  Relationships   Social connections    Talks on phone: Not on file    Gets together: Not on file    Attends religious service: Not on file    Active member of club or organization: Not on file    Attends meetings of clubs or organizations: Not on file    Relationship status: Not on file  Other Topics Concern   Not on file  Social History Narrative   Patient is divorced with 1 daughter living in [place].   Patient with history of smoking approximately 1 pack/day for 5 years.  Patient quit in 1970.   Patient has never used smokeless tobacco.   Patient with a history of drinking wine occasionally.  None since June 2020.   Patient denies use of illicit drugs.     Family History: The patient's family history includes Alzheimer's disease in his mother.  ROS:   All other ROS reviewed and negative. Pertinent positives noted in the HPI.     EKGs/Labs/Other Studies Reviewed:   The following studies were personally reviewed by me today:  EKG:  EKG is ordered today.  The ekg ordered today demonstrates atrial fibrillation, heart rate 70, nonspecific intraventricular conduction delay, nonspecific ST-T wave changes, normal intervals, and was personally reviewed by me.   Recent Labs: 10/18/2018: ALT 19; TSH 2.641 11/07/2018: BUN 14; Creatinine, Ser 1.24; Hemoglobin 16.7; Platelets 238; Potassium 4.1; Sodium 140   Recent Lipid Panel No results found for: CHOL, TRIG, HDL, CHOLHDL, VLDL, LDLCALC, LDLDIRECT  Physical Exam:   VS:  BP (!) 169/86    Pulse 67    Temp (!) 97.1 F (36.2 C)    Ht 5\' 10"  (1.778 m)    Wt 275 lb (124.7 kg)    SpO2 97%     BMI 39.46 kg/m    Wt Readings from Last 3 Encounters:  11/15/18 275 lb (124.7 kg)  10/18/18 259 lb (117.5 kg)  03/25/15 250 lb (113.4 kg)    General: Obese male, no acute distress Heart: Atraumatic, normal size  Eyes: PEERLA, EOMI  Neck: Supple, no JVD Endocrine:  No thryomegaly Cardiac: Normal S1, S2; irregular rhythm noted, no murmurs Lungs: Clear to auscultation bilaterally, no wheezing, rhonchi or rales  Abd: Soft, nontender, no hepatomegaly  Ext: Trace edema, pulses 2+ Musculoskeletal: No deformities, BUE and BLE strength normal and equal Skin: Warm and dry, no rashes   Neuro: Alert and oriented to person, place, time, and situation, CNII-XII grossly intact, no focal deficits  Psych: Normal mood and affect   ASSESSMENT:   NAME@ is a 73 y.o. male who presents for the following: 1. Permanent atrial fibrillation   2. Essential hypertension     PLAN:   1. Permanent atrial fibrillation -He has had atrial fibrillation for a number of years, with unsuccessful attempts at cardioversion.  He appears to not be bothered by being in atrial fibrillation.  I discussed with him that he will likely always remain in A. fib and attempts to restore sinus rhythm will likely be futile.  I think we will stop his amiodarone, and prescribe metoprolol succinate 100 mg daily for rate control. -I will obtain an echocardiogram to have images in our system, there was mention of aortic ectasia and we will evaluate this as well -He will continue with Eliquis 5 mg twice a day -We will also have a good baseline echocardiogram in our system due to his upcoming chemotherapy  2. Essential hypertension -We will stop his Lasix, and prescribe HCTZ 25 mg daily -We will see him back in 6 months   Disposition: Return in about 6 months (around 05/15/2019).  Medication Adjustments/Labs and Tests Ordered: Current medicines are reviewed at length with the patient today.  Concerns regarding medicines are outlined  above.  Orders Placed This Encounter  Procedures   EKG 12-Lead   ECHOCARDIOGRAM COMPLETE   Meds ordered this encounter  Medications   DISCONTD: metoprolol succinate (TOPROL-XL) 100 MG 24 hr tablet    Sig: Take 1 tablet (100 mg total) by mouth daily. Take with or immediately following a meal.    Dispense:  90 tablet    Refill:  3   DISCONTD: hydrochlorothiazide (HYDRODIURIL) 25 MG tablet    Sig: Take 1 tablet (25 mg total) by mouth daily.    Dispense:  90 tablet    Refill:  3   metoprolol succinate (TOPROL-XL) 100 MG 24 hr tablet    Sig: Take 1 tablet (100 mg total) by mouth daily. Take with or immediately following a meal.    Dispense:  90 tablet    Refill:  3   hydrochlorothiazide (HYDRODIURIL) 25 MG tablet    Sig: Take 1 tablet (25 mg total) by mouth daily.    Dispense:  90 tablet    Refill:  3    Patient Instructions  Medication Instructions:  Stop Amiodarone Stop Lasix Start Metoprolol Succinate 100 mg daily Start HCTZ 25 mg daily  If you need a refill on your cardiac medications before your next appointment, please call your pharmacy.   Lab work: None ordered   Testing/Procedures: Schedule Echo  Follow-Up: At [facility], you and your health needs are our priority.  As part of our continuing mission to provide you with exceptional heart care, we have created designated Provider Care Teams.  These Care Teams include your primary Cardiologist (physician) and Advanced Practice Providers (APPs -  Physician Assistants and Nurse Practitioners) who all work together to provide you with the care you need, when you need it.  Schedule follow up appointment in 6 months  Call in Dec to schedule  a March appointment       Signed, Gerri Spore T. Flora Lipps, MD [facility]  [address] [place], [place] [address] [phone]  11/15/2018 10:24 AM

## 2018-11-15 ENCOUNTER — Encounter: Payer: Self-pay | Admitting: Hematology

## 2018-11-15 ENCOUNTER — Encounter: Payer: Self-pay | Admitting: Cardiovascular Disease

## 2018-11-15 ENCOUNTER — Ambulatory Visit (INDEPENDENT_AMBULATORY_CARE_PROVIDER_SITE_OTHER): Payer: Medicare Other | Admitting: Cardiovascular Disease

## 2018-11-15 ENCOUNTER — Ambulatory Visit: Payer: Medicare Other | Admitting: Cardiovascular Disease

## 2018-11-15 ENCOUNTER — Inpatient Hospital Stay: Payer: Medicare Other

## 2018-11-15 ENCOUNTER — Ambulatory Visit: Payer: Medicare Other

## 2018-11-15 ENCOUNTER — Inpatient Hospital Stay: Payer: Medicare Other | Attending: Hematology | Admitting: Hematology

## 2018-11-15 ENCOUNTER — Other Ambulatory Visit: Payer: Self-pay

## 2018-11-15 VITALS — BP 169/86 | HR 67 | Temp 97.1°F | Ht 70.0 in | Wt 275.0 lb

## 2018-11-15 VITALS — BP 138/81 | HR 61 | Temp 98.5°F | Resp 18 | Ht 70.0 in | Wt 254.1 lb

## 2018-11-15 DIAGNOSIS — I4891 Unspecified atrial fibrillation: Secondary | ICD-10-CM | POA: Diagnosis not present

## 2018-11-15 DIAGNOSIS — Z95828 Presence of other vascular implants and grafts: Secondary | ICD-10-CM

## 2018-11-15 DIAGNOSIS — N183 Chronic kidney disease, stage 3 unspecified: Secondary | ICD-10-CM

## 2018-11-15 DIAGNOSIS — T402X5A Adverse effect of other opioids, initial encounter: Secondary | ICD-10-CM | POA: Diagnosis not present

## 2018-11-15 DIAGNOSIS — Z5111 Encounter for antineoplastic chemotherapy: Secondary | ICD-10-CM | POA: Diagnosis not present

## 2018-11-15 DIAGNOSIS — E46 Unspecified protein-calorie malnutrition: Secondary | ICD-10-CM | POA: Diagnosis not present

## 2018-11-15 DIAGNOSIS — Z79899 Other long term (current) drug therapy: Secondary | ICD-10-CM | POA: Diagnosis not present

## 2018-11-15 DIAGNOSIS — K5903 Drug induced constipation: Secondary | ICD-10-CM | POA: Insufficient documentation

## 2018-11-15 DIAGNOSIS — Z7901 Long term (current) use of anticoagulants: Secondary | ICD-10-CM | POA: Diagnosis not present

## 2018-11-15 DIAGNOSIS — I10 Essential (primary) hypertension: Secondary | ICD-10-CM

## 2018-11-15 DIAGNOSIS — R110 Nausea: Secondary | ICD-10-CM | POA: Diagnosis not present

## 2018-11-15 DIAGNOSIS — C01 Malignant neoplasm of base of tongue: Secondary | ICD-10-CM | POA: Diagnosis not present

## 2018-11-15 DIAGNOSIS — G893 Neoplasm related pain (acute) (chronic): Secondary | ICD-10-CM | POA: Insufficient documentation

## 2018-11-15 DIAGNOSIS — I4821 Permanent atrial fibrillation: Secondary | ICD-10-CM | POA: Diagnosis not present

## 2018-11-15 DIAGNOSIS — N182 Chronic kidney disease, stage 2 (mild): Secondary | ICD-10-CM | POA: Insufficient documentation

## 2018-11-15 LAB — BASIC METABOLIC PANEL - CANCER CENTER ONLY
Anion gap: 10 (ref 5–15)
BUN: 17 mg/dL (ref 8–23)
CO2: 24 mmol/L (ref 22–32)
Calcium: 8.7 mg/dL — ABNORMAL LOW (ref 8.9–10.3)
Chloride: 106 mmol/L (ref 98–111)
Creatinine: 1.24 mg/dL (ref 0.61–1.24)
GFR, Est AFR Am: >60 mL/min (ref >60)
GFR, Estimated: 57 mL/min — ABNORMAL LOW (ref >60)
Glucose, Bld: 96 mg/dL (ref 70–99)
Potassium: 3.8 mmol/L (ref 3.5–5.1)
Sodium: 140 mmol/L (ref 135–145)

## 2018-11-15 LAB — CBC WITH DIFFERENTIAL (CANCER CENTER ONLY)
Abs Immature Granulocytes: 0.08 10*3/uL — ABNORMAL HIGH (ref 0.00–0.07)
Basophils Absolute: 0.1 10*3/uL (ref 0.0–0.1)
Basophils Relative: 1 %
Eosinophils Absolute: 0.1 10*3/uL (ref 0.0–0.5)
Eosinophils Relative: 1 %
HCT: 43.6 % (ref 39.0–52.0)
Hemoglobin: 14.6 g/dL (ref 13.0–17.0)
Immature Granulocytes: 1 %
Lymphocytes Relative: 9 %
Lymphs Abs: 0.7 10*3/uL (ref 0.7–4.0)
MCH: 30.3 pg (ref 26.0–34.0)
MCHC: 33.5 g/dL (ref 30.0–36.0)
MCV: 90.5 fL (ref 80.0–100.0)
Monocytes Absolute: 0.5 10*3/uL (ref 0.1–1.0)
Monocytes Relative: 7 %
Neutro Abs: 5.7 10*3/uL (ref 1.7–7.7)
Neutrophils Relative %: 81 %
Platelet Count: 221 10*3/uL (ref 150–400)
RBC: 4.82 MIL/uL (ref 4.22–5.81)
RDW: 12.9 % (ref 11.5–15.5)
WBC Count: 7 10*3/uL (ref 4.0–10.5)
nRBC: 0 % (ref 0.0–0.2)

## 2018-11-15 LAB — MAGNESIUM: Magnesium: 2 mg/dL (ref 1.7–2.4)

## 2018-11-15 MED ORDER — HYDROCHLOROTHIAZIDE 25 MG PO TABS: 25.0000 mg | ORAL_TABLET | Freq: Every day | ORAL | 3 refills | Status: DC

## 2018-11-15 MED ORDER — LIDOCAINE-PRILOCAINE 2.5-2.5 % EX CREA: TOPICAL_CREAM | CUTANEOUS | 3 refills | Status: DC

## 2018-11-15 MED ORDER — LORAZEPAM 0.5 MG PO TABS: 0.5000 mg | ORAL_TABLET | Freq: Four times a day (QID) | ORAL | 0 refills | Status: DC | PRN

## 2018-11-15 MED ORDER — DEXAMETHASONE 4 MG PO TABS: 8.0000 mg | ORAL_TABLET | Freq: Every day | ORAL | 1 refills | Status: DC

## 2018-11-15 MED ORDER — METOPROLOL SUCCINATE ER 100 MG PO TB24: 100.0000 mg | ORAL_TABLET | Freq: Every day | ORAL | 3 refills | Status: DC

## 2018-11-15 MED ORDER — HEPARIN SOD (PORK) LOCK FLUSH 100 UNIT/ML IV SOLN
500.0000 [IU] | Freq: Once | INTRAVENOUS | Status: AC
  Administered 2018-11-15: 500 [IU]
  Filled 2018-11-15: qty 5

## 2018-11-15 MED ORDER — SODIUM CHLORIDE 0.9% FLUSH
10.0000 mL | Freq: Once | INTRAVENOUS | Status: AC
  Administered 2018-11-15: 10 mL
  Filled 2018-11-15: qty 10

## 2018-11-15 MED ORDER — OXYCODONE HCL 5 MG PO TABS: 5.0000 mg | ORAL_TABLET | Freq: Four times a day (QID) | ORAL | 0 refills | Status: DC | PRN

## 2018-11-15 MED ORDER — PROCHLORPERAZINE MALEATE 10 MG PO TABS: 10.0000 mg | ORAL_TABLET | Freq: Four times a day (QID) | ORAL | 1 refills | Status: DC | PRN

## 2018-11-15 MED ORDER — ONDANSETRON HCL 8 MG PO TABS: 8.0000 mg | ORAL_TABLET | Freq: Two times a day (BID) | ORAL | 1 refills | Status: DC | PRN

## 2018-11-15 NOTE — Progress Notes (Signed)
[place] OFFICE PROGRESS NOTE  Patient Care Team: Bartholome Bill, MD as PCP - General (Family Medicine) O'Neal, Cassie Freer, MD as PCP - Cardiology (Cardiology) Eppie Gibson, MD as Attending Physician (Radiation Oncology) Leota Sauers, RN as Oncology Nurse Navigator  HEME/ONC OVERVIEW: 1. 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 A-fib on Eliquis   2. 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, p16+ -I reinforced the rationale for chemotherapy, as well as some the potential side effects, including nausea, vomiting, diarrhea, and increased risk of infection -I also reviewed with the patient the appropriate usage of anti-emetics -Labs adequate today, proceed with Cycle 1 of weekly carboplatin -Plan for 7 cycles -PRN antiemetics, Zofran, Compazine, dexamethasone, Ativan  Cancer-related pain -Secondary to the SCCa of the tongue and LN involvement -Unable to tolerate IR liquid morphine due to constipation -Due to the risk of masking fever with Norco, I have prescribed oxycodone 5mg  q6hrs PRN for pain   Stage II-III CKD -Baseline Cr ~1.2 -Cr 1.24 today, stable; electrolytes normal -We will monitor it closely   No orders of the defined types were placed in this encounter.  All questions were answered. The patient knows to call the clinic with any problems, questions or concerns. No barriers to learning was detected.  A total of more than  40 minutes were spent face-to-face with the patient during this encounter and over half of that time was spent on counseling and coordination of care as outlined above.   [name], MD 11/15/2018 2:08 PM  CHIEF COMPLAINT: "I still have some pain in my mouth"  INTERVAL HISTORY: Neil [name] returns to clinic for follow-up of squamous cell carcinoma of the base of the tongue on definitive chemoradiation.  Patient reports that he underwent full mouth dental extraction 2 weeks ago, and still has some periodic pain in the mouth, for which he takes Norco approximately 1-2 times a day with adequate pain control.  He tried IR liquid morphine before, but could not tolerate it due to severe constipation.  He is doing intermittent water flushes through the feeding tube without any difficulty.  He denies any other complaint today.  SUMMARY OF ONCOLOGIC HISTORY: Oncology History  Cancer of base of tongue ([place])  07/29/2018 Imaging   Neck ultrasound: IMPRESSION: 16 x 30 mm solid mass in the right submandibular area. This may represent a submandibular mass or enlarged lymph node. Recommend CT neck with contrast for further evaluation.   08/03/2018 Imaging   CT neck (at [facility]): IMPRESSION: 1. 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 There is a single ipsilateral malignant lymph node measuring 3 cm. 2. Nodular thickening along the anterior right sublingual gland, attention on follow-up PET.   09/04/2018 Procedure   US-guided bx of the R submandibular mass   09/04/2018 Pathology Results   Accession: [id number]  Lymph node, needle/core biopsy, right submandibular - SQUAMOUS CELL CARCINOMA. - SEE MICROSCOPIC DESCRIPTION.  09/11/2018 Imaging   PET: IMPRESSION: Hypermetabolism along the right base of tongue, corresponding to the patient's known primary oropharyngeal cancer.   Ipsilateral level 2 cervical nodal metastasis.    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   11/16/2018 -  Chemotherapy   The patient had palonosetron (ALOXI) injection 0.25 mg, 0.25 mg, Intravenous,  Once, 0 of 7 cycles CARBOplatin (PARAPLATIN) 200 mg in sodium chloride 0.9 % 100 mL chemo infusion, 200 mg (100 % of original dose 202.8 mg), Intravenous,  Once, 0 of 7 cycles Dose modification: 202.8 mg (original dose 202.8 mg, Cycle 1)  for chemotherapy treatment.      REVIEW OF SYSTEMS:   Constitutional: ( - ) fevers, ( - )  chills , ( - ) night sweats Eyes: ( - ) blurriness of vision, ( - ) double vision, ( - ) watery eyes Ears, nose, mouth, throat, and face: ( - ) mucositis, ( + ) sore throat Respiratory: ( - ) cough, ( - ) dyspnea, ( - ) wheezes Cardiovascular: ( - ) palpitation, ( - ) chest discomfort, ( - ) lower extremity swelling Gastrointestinal:  ( - ) nausea, ( - ) heartburn, ( - ) change in bowel habits Skin: ( - ) abnormal skin rashes Lymphatics: ( - ) new lymphadenopathy, ( - ) easy bruising Neurological: ( - ) numbness, ( - ) tingling, ( - ) new weaknesses Behavioral/Psych: ( - ) mood change, ( - ) new changes  All other systems were reviewed with the patient and are negative.  I have reviewed the past medical history, past surgical history, social history and family history with the patient and they are unchanged from previous note.  ALLERGIES:  is allergic to lisinopril.  MEDICATIONS:  Current Outpatient Medications  Medication Sig Dispense Refill  . Apixaban (ELIQUIS PO) Take 5 mg by mouth 2 (two) times daily.     Marland Kitchen co-enzyme Q-10 30 MG capsule Take 30 mg by mouth 2 (two) times daily. 200 mg twice daily    . dexamethasone (DECADRON) 4 MG tablet Take 2 tablets (8 mg total) by mouth daily. Start the day after chemotherapy for 2 days. Take with food. 30 tablet 1  . docusate sodium (COLACE) 100 MG capsule Take 100 mg by mouth as  directed. Take 100 mg in AM,  200 mg in PM.    . hydrochlorothiazide (HYDRODIURIL) 25 MG tablet Take 1 tablet (25 mg total) by mouth daily. 90 tablet 3  . HYDROcodone-acetaminophen (NORCO/VICODIN) 5-325 MG tablet Take 1 tablet by mouth every 4 (four) hours as needed for moderate pain.    Marland Kitchen lidocaine-prilocaine (EMLA) cream Apply to affected area once 30 g 3  . LORazepam (ATIVAN) 0.5 MG tablet Take 1 tablet (0.5 mg total) by mouth every 6 (six) hours as needed (Nausea or vomiting). 30 tablet 0  . Magnesium Citrate 100 MG TABS Take 1 tablet by mouth daily.    . metoprolol succinate (TOPROL-XL) 100 MG 24 hr tablet Take 1 tablet (100 mg total) by mouth daily. Take with or immediately following a meal. 90 tablet 3  . omeprazole (PRILOSEC) 20 MG capsule Take 20 mg by mouth daily.    . ondansetron (ZOFRAN) 8 MG tablet Take 1 tablet (8 mg total) by mouth 2 (two) times daily as needed for refractory nausea / vomiting. Start on day 3 after chemotherapy. 30 tablet 1  . oxyCODONE (OXY IR/ROXICODONE) 5 MG immediate  release tablet Take 1 tablet (5 mg total) by mouth every 6 (six) hours as needed for severe pain. 60 tablet 0  . prochlorperazine (COMPAZINE) 10 MG tablet Take 1 tablet (10 mg total) by mouth every 6 (six) hours as needed (Nausea or vomiting). 30 tablet 1  . tamsulosin (FLOMAX) 0.4 MG CAPS capsule Take 0.4 mg by mouth daily after supper.     No current facility-administered medications for this visit.     PHYSICAL EXAMINATION: ECOG PERFORMANCE STATUS: 2 - Symptomatic, <50% confined to bed  Today's Vitals   11/15/18 1127 11/15/18 1131  BP: 138/81   Pulse: 61   Resp: 18   Temp: 98.5 F (36.9 C)   TempSrc: Oral   SpO2: 98%   Weight: 254 lb 1.6 oz (115.3 kg)   Height: 5\' 10"  (1.778 m)   PainSc:  2    Body mass index is 36.46 kg/m.  Filed Weights   11/15/18 1127  Weight: 254 lb 1.6 oz (115.3 kg)    GENERAL: alert, no distress and comfortable SKIN: skin color, texture, turgor are  normal, no rashes or significant lesions EYES: conjunctiva are pink and non-injected, sclera clear OROPHARYNX: no exudate, no erythema; lips, buccal mucosa, and tongue normal  NECK: supple, non-tender LYMPH:  ~2cm, mobile, right Level II cervical LN  LUNGS: clear to auscultation with normal breathing effort HEART: regular rate & rhythm and no murmurs and no lower extremity edema ABDOMEN: soft, non-tender, non-distended, normal bowel sounds Musculoskeletal: no cyanosis of digits and no clubbing  PSYCH: alert & oriented x 3, fluent speech NEURO: no focal motor/sensory deficits  LABORATORY DATA:  I have reviewed the data as listed    Component Value Date/Time   NA 140 11/15/2018 1051   K 3.8 11/15/2018 1051   CL 106 11/15/2018 1051   CO2 24 11/15/2018 1051   GLUCOSE 96 11/15/2018 1051   BUN 17 11/15/2018 1051   CREATININE 1.24 11/15/2018 1051   CALCIUM 8.7 (L) 11/15/2018 1051   PROT 6.6 10/18/2018 0846   ALBUMIN 3.9 10/18/2018 0846   AST 21 10/18/2018 0846   ALT 19 10/18/2018 0846   ALKPHOS 39 10/18/2018 0846   BILITOT 0.6 10/18/2018 0846   GFRNONAA 57 (L) 11/15/2018 1051   GFRAA >60 11/15/2018 1051    No results found for: SPEP, UPEP  Lab Results  Component Value Date   WBC 7.0 11/15/2018   NEUTROABS 5.7 11/15/2018   HGB 14.6 11/15/2018   HCT 43.6 11/15/2018   MCV 90.5 11/15/2018   PLT 221 11/15/2018      Chemistry      Component Value Date/Time   NA 140 11/15/2018 1051   K 3.8 11/15/2018 1051   CL 106 11/15/2018 1051   CO2 24 11/15/2018 1051   BUN 17 11/15/2018 1051   CREATININE 1.24 11/15/2018 1051      Component Value Date/Time   CALCIUM 8.7 (L) 11/15/2018 1051   ALKPHOS 39 10/18/2018 0846   AST 21 10/18/2018 0846   ALT 19 10/18/2018 0846   BILITOT 0.6 10/18/2018 0846       RADIOGRAPHIC STUDIES: I have personally reviewed the radiological images as listed below and agreed with the findings in the report. Ir Gastrostomy Tube Mod Sed  Result Date:  11/07/2018 INDICATION: 74 year old male with lingual squamous cell carcinoma. He presents for combination procedure with placement of a percutaneous gastrostomy tube and port catheter. EXAM: IMPLANTED PORT A CATH PLACEMENT WITH ULTRASOUND AND FLUOROSCOPIC GUIDANCE IR percutaneous gastrostomy  tube placement MEDICATIONS: 2 g Ancef; The antibiotic was administered within an appropriate time interval prior to skin puncture. ANESTHESIA/SEDATION: Versed 6 mg IV; Fentanyl 200 mcg IV; Moderate Sedation Time:  53 minutes The patient was continuously monitored during the procedure by the interventional radiology nurse under my direct supervision. FLUOROSCOPY TIME:  2 minutes, 6 seconds (Q000111Q mGy) COMPLICATIONS: None immediate. PROCEDURE: PORT PLACEMENT The right neck and chest was prepped with chlorhexidine, and draped in the usual sterile fashion using maximum barrier technique (cap and mask, sterile gown, sterile gloves, large sterile sheet, hand hygiene and cutaneous antiseptic). Local anesthesia was attained by infiltration with 1% lidocaine with epinephrine. Ultrasound demonstrated patency of the right internal jugular vein, and this was documented with an image. Under real-time ultrasound guidance, this vein was accessed with a 21 gauge micropuncture needle and image documentation was performed. A small dermatotomy was made at the access site with an 11 scalpel. A 0.018" wire was advanced into the SVC and the access needle exchanged for a 70F micropuncture vascular sheath. The 0.018" wire was then removed and a 0.035" wire advanced into the IVC. An appropriate location for the subcutaneous reservoir was selected below the clavicle and an incision was made through the skin and underlying soft tissues. The subcutaneous tissues were then dissected using a combination of blunt and sharp surgical technique and a pocket was formed. A single lumen power injectable portacatheter was then tunneled through the subcutaneous tissues  from the pocket to the dermatotomy and the port reservoir placed within the subcutaneous pocket. The venous access site was then serially dilated and a peel away vascular sheath placed over the wire. The wire was removed and the port catheter advanced into position under fluoroscopic guidance. The catheter tip is positioned in the superior cavoatrial junction. This was documented with a spot image. The portacatheter was then tested and found to flush and aspirate well. The port was flushed with saline followed by 100 units/mL heparinized saline. The pocket was then closed in two layers using first subdermal inverted interrupted absorbable sutures followed by a running subcuticular suture. The epidermis was then sealed with Dermabond. The dermatotomy at the venous access site was also closed with Dermabond. GASTROSTOMY TUBE PLACEMENT Maximal barrier sterile technique utilized including caps, mask, sterile gowns, sterile gloves, large sterile drape, hand hygiene, and chlorhexadine skin prep. An angled catheter was advanced over a wire under fluoroscopic guidance through the nose, down the esophagus and into the body of the stomach. The stomach was then insufflated with several 100 ml of air. Fluoroscopy confirmed location of the gastric bubble, as well as inferior displacement of the barium stained colon. Under direct fluoroscopic guidance, a single T-tack was placed, and the anterior gastric wall drawn up against the anterior abdominal wall. Percutaneous access was then obtained into the mid gastric body with an 18 gauge sheath needle. Aspiration of air, and injection of contrast material under fluoroscopy confirmed needle placement. An Amplatz wire was advanced in the gastric body and the access needle exchanged for a 9-French vascular sheath. A snare device was advanced through the vascular sheath and an Amplatz wire advanced through the angled catheter. The Amplatz wire was successfully snared and this was pulled  up through the esophagus and out the mouth. A 20-French Alinda Dooms MIC-PEG tube was then connected to the snare and pulled through the mouth, down the esophagus, into the stomach and out to the anterior abdominal wall. Hand injection of contrast material confirmed intragastric location. The  T-tack retention suture was then cut. The pull through peg tube was then secured with the external bumper and capped. The patient will be observed for several hours with the newly placed tube on low wall suction to evaluate for any post procedure complication. The patient tolerated the procedure well, there is no immediate complication. IMPRESSION: 1. Successful placement of a right IJ approach Power Port with ultrasound and fluoroscopic guidance. The catheter is ready for use. 2. Successful placement of a 20 French pull-through percutaneous gastrostomy tube. Signed, Criselda Peaches, MD, [facility] Radiology Electronically Signed   By: Jacqulynn Cadet M.D.   On: 11/07/2018 16:15   Ir Imaging Guided Port Insertion  Result Date: 11/07/2018 INDICATION: 73 year old male with lingual squamous cell carcinoma. He presents for combination procedure with placement of a percutaneous gastrostomy tube and port catheter. EXAM: IMPLANTED PORT A CATH PLACEMENT WITH ULTRASOUND AND FLUOROSCOPIC GUIDANCE IR percutaneous gastrostomy tube placement MEDICATIONS: 2 g Ancef; The antibiotic was administered within an appropriate time interval prior to skin puncture. ANESTHESIA/SEDATION: Versed 6 mg IV; Fentanyl 200 mcg IV; Moderate Sedation Time:  53 minutes The patient was continuously monitored during the procedure by the interventional radiology nurse under my direct supervision. FLUOROSCOPY TIME:  2 minutes, 6 seconds (Q000111Q mGy) COMPLICATIONS: None immediate. PROCEDURE: PORT PLACEMENT The right neck and chest was prepped with chlorhexidine, and draped in the usual sterile fashion  using maximum barrier technique (cap and mask, sterile gown, sterile gloves, large sterile sheet, hand hygiene and cutaneous antiseptic). Local anesthesia was attained by infiltration with 1% lidocaine with epinephrine. Ultrasound demonstrated patency of the right internal jugular vein, and this was documented with an image. Under real-time ultrasound guidance, this vein was accessed with a 21 gauge micropuncture needle and image documentation was performed. A small dermatotomy was made at the access site with an 11 scalpel. A 0.018" wire was advanced into the SVC and the access needle exchanged for a 54F micropuncture vascular sheath. The 0.018" wire was then removed and a 0.035" wire advanced into the IVC. An appropriate location for the subcutaneous reservoir was selected below the clavicle and an incision was made through the skin and underlying soft tissues. The subcutaneous tissues were then dissected using a combination of blunt and sharp surgical technique and a pocket was formed. A single lumen power injectable portacatheter was then tunneled through the subcutaneous tissues from the pocket to the dermatotomy and the port reservoir placed within the subcutaneous pocket. The venous access site was then serially dilated and a peel away vascular sheath placed over the wire. The wire was removed and the port catheter advanced into position under fluoroscopic guidance. The catheter tip is positioned in the superior cavoatrial junction. This was documented with a spot image. The portacatheter was then tested and found to flush and aspirate well. The port was flushed with saline followed by 100 units/mL heparinized saline. The pocket was then closed in two layers using first subdermal inverted interrupted absorbable sutures followed by a running subcuticular suture. The epidermis was then sealed with Dermabond. The dermatotomy at the venous access site was also closed with Dermabond. GASTROSTOMY TUBE PLACEMENT  Maximal barrier sterile technique utilized including caps, mask, sterile gowns, sterile gloves, large sterile drape, hand hygiene, and chlorhexadine skin prep. An angled catheter was advanced over a wire under fluoroscopic guidance through the nose, down the esophagus and into the body of the stomach. The stomach was then insufflated with several 100 ml  of air. Fluoroscopy confirmed location of the gastric bubble, as well as inferior displacement of the barium stained colon. Under direct fluoroscopic guidance, a single T-tack was placed, and the anterior gastric wall drawn up against the anterior abdominal wall. Percutaneous access was then obtained into the mid gastric body with an 18 gauge sheath needle. Aspiration of air, and injection of contrast material under fluoroscopy confirmed needle placement. An Amplatz wire was advanced in the gastric body and the access needle exchanged for a 9-French vascular sheath. A snare device was advanced through the vascular sheath and an Amplatz wire advanced through the angled catheter. The Amplatz wire was successfully snared and this was pulled up through the esophagus and out the mouth. A 20-French Alinda Dooms MIC-PEG tube was then connected to the snare and pulled through the mouth, down the esophagus, into the stomach and out to the anterior abdominal wall. Hand injection of contrast material confirmed intragastric location. The T-tack retention suture was then cut. The pull through peg tube was then secured with the external bumper and capped. The patient will be observed for several hours with the newly placed tube on low wall suction to evaluate for any post procedure complication. The patient tolerated the procedure well, there is no immediate complication. IMPRESSION: 1. Successful placement of a right IJ approach Power Port with ultrasound and fluoroscopic guidance. The catheter is ready for use. 2. Successful placement of a 20 French pull-through percutaneous  gastrostomy tube. Signed, Criselda Peaches, MD, [facility] Radiology Electronically Signed   By: Jacqulynn Cadet M.D.   On: 11/07/2018 16:15

## 2018-11-15 NOTE — Patient Instructions (Signed)
Medication Instructions:  Stop Amiodarone Stop Lasix Start Metoprolol Succinate 100 mg daily Start HCTZ 25 mg daily  If you need a refill on your cardiac medications before your next appointment, please call your pharmacy.   Lab work: None ordered   Testing/Procedures: Schedule Echo  Follow-Up: At [facility], you and your health needs are our priority.  As part of our continuing mission to provide you with exceptional heart care, we have created designated Provider Care Teams.  These Care Teams include your primary Cardiologist (physician) and Advanced Practice Providers (APPs -  Physician Assistants and Nurse Practitioners) who all work together to provide you with the care you need, when you need it. . Schedule follow up appointment in 6 months  Call in Dec to schedule a March appointment

## 2018-11-15 NOTE — Patient Instructions (Signed)

## 2018-11-16 ENCOUNTER — Other Ambulatory Visit: Payer: Self-pay

## 2018-11-16 ENCOUNTER — Telehealth: Payer: Self-pay | Admitting: *Deleted

## 2018-11-16 ENCOUNTER — Inpatient Hospital Stay: Payer: Medicare Other

## 2018-11-16 ENCOUNTER — Encounter: Payer: Self-pay | Admitting: *Deleted

## 2018-11-16 ENCOUNTER — Ambulatory Visit
Admission: RE | Admit: 2018-11-16 | Discharge: 2018-11-16 | Disposition: A | Discharge status: Home / Self Care | Payer: Medicare Other | Source: Ambulatory Visit | Attending: Radiation Oncology | Admitting: Radiation Oncology

## 2018-11-16 VITALS — BP 150/79 | HR 55 | Temp 98.2°F | Resp 18

## 2018-11-16 DIAGNOSIS — C01 Malignant neoplasm of base of tongue: Secondary | ICD-10-CM

## 2018-11-16 DIAGNOSIS — E46 Unspecified protein-calorie malnutrition: Secondary | ICD-10-CM | POA: Diagnosis not present

## 2018-11-16 DIAGNOSIS — R110 Nausea: Secondary | ICD-10-CM | POA: Diagnosis not present

## 2018-11-16 DIAGNOSIS — Z5111 Encounter for antineoplastic chemotherapy: Secondary | ICD-10-CM | POA: Diagnosis not present

## 2018-11-16 DIAGNOSIS — G893 Neoplasm related pain (acute) (chronic): Secondary | ICD-10-CM | POA: Diagnosis not present

## 2018-11-16 DIAGNOSIS — Z510 Encounter for antineoplastic radiation therapy: Secondary | ICD-10-CM | POA: Diagnosis not present

## 2018-11-16 DIAGNOSIS — K5903 Drug induced constipation: Secondary | ICD-10-CM | POA: Diagnosis not present

## 2018-11-16 MED ORDER — DEXAMETHASONE SODIUM PHOSPHATE 10 MG/ML IJ SOLN
INTRAMUSCULAR | Status: AC
  Filled 2018-11-16: qty 1

## 2018-11-16 MED ORDER — PALONOSETRON HCL INJECTION 0.25 MG/5ML
0.2500 mg | Freq: Once | INTRAVENOUS | Status: AC
  Administered 2018-11-16: 0.25 mg via INTRAVENOUS

## 2018-11-16 MED ORDER — HEPARIN SOD (PORK) LOCK FLUSH 100 UNIT/ML IV SOLN
500.0000 [IU] | Freq: Once | INTRAVENOUS | Status: AC | PRN
  Administered 2018-11-16: 500 [IU]
  Filled 2018-11-16: qty 5

## 2018-11-16 MED ORDER — PALONOSETRON HCL INJECTION 0.25 MG/5ML
INTRAVENOUS | Status: AC
  Filled 2018-11-16: qty 5

## 2018-11-16 MED ORDER — DEXAMETHASONE SODIUM PHOSPHATE 10 MG/ML IJ SOLN
10.0000 mg | Freq: Once | INTRAMUSCULAR | Status: AC
  Administered 2018-11-16: 09:00:00 10 mg via INTRAVENOUS

## 2018-11-16 MED ORDER — SODIUM CHLORIDE 0.9 % IV SOLN
Freq: Once | INTRAVENOUS | Status: AC
  Administered 2018-11-16: 08:00:00 via INTRAVENOUS
  Filled 2018-11-16: qty 250

## 2018-11-16 MED ORDER — SODIUM CHLORIDE 0.9% FLUSH
10.0000 mL | INTRAVENOUS | Status: DC | PRN
  Administered 2018-11-16: 10:00:00 10 mL
  Filled 2018-11-16: qty 10

## 2018-11-16 MED ORDER — SONAFINE EX EMUL
1.0000 "application " | Freq: Once | CUTANEOUS | Status: AC
  Administered 2018-11-16: 1 via TOPICAL

## 2018-11-16 MED ORDER — SODIUM CHLORIDE 0.9 % IV SOLN
202.8000 mg | Freq: Once | INTRAVENOUS | Status: AC
  Administered 2018-11-16: 200 mg via INTRAVENOUS
  Filled 2018-11-16: qty 20

## 2018-11-16 NOTE — Progress Notes (Signed)

## 2018-11-16 NOTE — Telephone Encounter (Signed)
A user error has taken place: encounter opened in error, closed for administrative reasons.

## 2018-11-16 NOTE — Progress Notes (Signed)
Oncology Nurse Navigator Documentation  In follow-up to patient call s/p initial chemo this morning re PEG site discomfort, met with him in [place] prior to initial XRT to evaluate. He reported:  Significant pain at site with movement, particularly when getting in and out of bed.   Successfully conducting daily PEG flush and dsg change s/p last week's placement. I explained some discomfort to be expected shortly after placement, discussed 'log role' technique when getting in/out of bed. Upon evaluation:  PEG Insertion site w/o split gauze under retention ring ("I was in a hurry this morning."), site with scant blood-tinged clear fluid, skin under ring slightly erythematous; overall appearance WNL for having been placed 10 days ago.  Retention ring positioned ca 9.4 cm. I adjusted retention ring to slightly above 9.0 cm, cleaned site with NS, dressed with split gauze.  He reported initial relief. Escorted him to [facility] for initial RT, met with him afterwards.  He denied difficulty with tmt, stated notably less PEG discomfort particularly when positioning on/off table.  Provided him updated Epic appt calendar with adjusted RT times per chemo schedule. He agreed to my follow-up tomorrow when he arrives for RT.  Gayleen Orem, RN, BSN [facility] Neck Oncology Nurse [place] [facility] [place] [phone]

## 2018-11-16 NOTE — Patient Instructions (Addendum)
Carboplatin injection What is this medicine? CARBOPLATIN (KAR boe pla tin) is a chemotherapy drug. It targets fast dividing cells, like cancer cells, and causes these cells to die. This medicine is used to treat ovarian cancer and many other cancers. This medicine may be used for other purposes; ask your health care provider or pharmacist if you have questions. COMMON BRAND NAME(S): Paraplatin What should I tell my health care provider before I take this medicine? They need to know if you have any of these conditions:  blood disorders  hearing problems  kidney disease  recent or ongoing radiation therapy  an unusual or allergic reaction to carboplatin, cisplatin, other chemotherapy, other medicines, foods, dyes, or preservatives  pregnant or trying to get pregnant  breast-feeding How should I use this medicine? This drug is usually given as an infusion into a vein. It is administered in a hospital or clinic by a specially trained health care professional. Talk to your pediatrician regarding the use of this medicine in children. Special care may be needed. Overdosage: If you think you have taken too much of this medicine contact a poison control center or emergency room at once. NOTE: This medicine is only for you. Do not share this medicine with others. What if I miss a dose? It is important not to miss a dose. Call your doctor or health care professional if you are unable to keep an appointment. What may interact with this medicine?  medicines for seizures  medicines to increase blood counts like filgrastim, pegfilgrastim, sargramostim  some antibiotics like amikacin, gentamicin, neomycin, streptomycin, tobramycin  vaccines Talk to your doctor or health care professional before taking any of these medicines:  acetaminophen  aspirin  ibuprofen  ketoprofen  naproxen This list may not describe all possible interactions. Give your health care provider a list of all the  medicines, herbs, non-prescription drugs, or dietary supplements you use. Also tell them if you smoke, drink alcohol, or use illegal drugs. Some items may interact with your medicine. What should I watch for while using this medicine? Your condition will be monitored carefully while you are receiving this medicine. You will need important blood work done while you are taking this medicine. This drug may make you feel generally unwell. This is not uncommon, as chemotherapy can affect healthy cells as well as cancer cells. Report any side effects. Continue your course of treatment even though you feel ill unless your doctor tells you to stop. In some cases, you may be given additional medicines to help with side effects. Follow all directions for their use. Call your doctor or health care professional for advice if you get a fever, chills or sore throat, or other symptoms of a cold or flu. Do not treat yourself. This drug decreases your body's ability to fight infections. Try to avoid being around people who are sick. This medicine may increase your risk to bruise or bleed. Call your doctor or health care professional if you notice any unusual bleeding. Be careful brushing and flossing your teeth or using a toothpick because you may get an infection or bleed more easily. If you have any dental work done, tell your dentist you are receiving this medicine. Avoid taking products that contain aspirin, acetaminophen, ibuprofen, naproxen, or ketoprofen unless instructed by your doctor. These medicines may hide a fever. Do not become pregnant while taking this medicine. Women should inform their doctor if they wish to become pregnant or think they might be pregnant. There is a   potential for serious side effects to an unborn child. Talk to your health care professional or pharmacist for more information. Do not breast-feed an infant while taking this medicine. What side effects may I notice from receiving this  medicine? Side effects that you should report to your doctor or health care professional as soon as possible:  allergic reactions like skin rash, itching or hives, swelling of the face, lips, or tongue  signs of infection - fever or chills, cough, sore throat, pain or difficulty passing urine  signs of decreased platelets or bleeding - bruising, pinpoint red spots on the skin, black, tarry stools, nosebleeds  signs of decreased red blood cells - unusually weak or tired, fainting spells, lightheadedness  breathing problems  changes in hearing  changes in vision  chest pain  high blood pressure  low blood counts - This drug may decrease the number of white blood cells, red blood cells and platelets. You may be at increased risk for infections and bleeding.  nausea and vomiting  pain, swelling, redness or irritation at the injection site  pain, tingling, numbness in the hands or feet  problems with balance, talking, walking  trouble passing urine or change in the amount of urine Side effects that usually do not require medical attention (report to your doctor or health care professional if they continue or are bothersome):  hair loss  loss of appetite  metallic taste in the mouth or changes in taste This list may not describe all possible side effects. Call your doctor for medical advice about side effects. You may report side effects to FDA at 1-800-FDA-1088. Where should I keep my medicine? This drug is given in a hospital or clinic and will not be stored at home. NOTE: This sheet is a summary. It may not cover all possible information. If you have questions about this medicine, talk to your doctor, pharmacist, or health care provider.  2020 [employer] (2007-05-30 14:38:05)  

## 2018-11-17 ENCOUNTER — Ambulatory Visit
Admission: RE | Admit: 2018-11-17 | Discharge: 2018-11-17 | Disposition: A | Discharge status: Home / Self Care | Payer: Medicare Other | Source: Ambulatory Visit | Attending: Radiation Oncology | Admitting: Radiation Oncology

## 2018-11-17 ENCOUNTER — Other Ambulatory Visit: Payer: Self-pay

## 2018-11-17 ENCOUNTER — Telehealth: Payer: Self-pay | Admitting: *Deleted

## 2018-11-17 DIAGNOSIS — C01 Malignant neoplasm of base of tongue: Secondary | ICD-10-CM | POA: Diagnosis not present

## 2018-11-17 DIAGNOSIS — Z510 Encounter for antineoplastic radiation therapy: Secondary | ICD-10-CM | POA: Diagnosis not present

## 2018-11-17 NOTE — Telephone Encounter (Signed)
Called pt to discuss how he did with his carboplatin treatment yest.  He reports no problems at this time.  He expressed that he thought he was prepared well by medical oncology & radiation oncology.  Reminded to call with any questions or concerns & he states that he will.

## 2018-11-17 NOTE — Telephone Encounter (Signed)
-----   Message from Priscille Loveless, RN sent at 11/16/2018  9:02 AM EDT ----- Regarding: First chemo follow up Neil [name] First chemo follow up Carboplatin

## 2018-11-20 ENCOUNTER — Ambulatory Visit
Admission: RE | Admit: 2018-11-20 | Discharge: 2018-11-20 | Disposition: A | Discharge status: Home / Self Care | Payer: Medicare Other | Source: Ambulatory Visit | Attending: Radiation Oncology | Admitting: Radiation Oncology

## 2018-11-20 ENCOUNTER — Other Ambulatory Visit: Payer: Self-pay

## 2018-11-20 DIAGNOSIS — C01 Malignant neoplasm of base of tongue: Secondary | ICD-10-CM | POA: Diagnosis not present

## 2018-11-20 DIAGNOSIS — Z510 Encounter for antineoplastic radiation therapy: Secondary | ICD-10-CM | POA: Diagnosis not present

## 2018-11-21 ENCOUNTER — Ambulatory Visit
Admission: RE | Admit: 2018-11-21 | Discharge: 2018-11-21 | Disposition: A | Discharge status: Home / Self Care | Payer: Medicare Other | Source: Ambulatory Visit | Attending: Radiation Oncology | Admitting: Radiation Oncology

## 2018-11-21 ENCOUNTER — Other Ambulatory Visit: Payer: Self-pay

## 2018-11-21 DIAGNOSIS — C01 Malignant neoplasm of base of tongue: Secondary | ICD-10-CM | POA: Diagnosis not present

## 2018-11-21 DIAGNOSIS — Z510 Encounter for antineoplastic radiation therapy: Secondary | ICD-10-CM | POA: Diagnosis not present

## 2018-11-22 ENCOUNTER — Encounter: Payer: Self-pay | Admitting: Hematology

## 2018-11-22 ENCOUNTER — Inpatient Hospital Stay (HOSPITAL_BASED_OUTPATIENT_CLINIC_OR_DEPARTMENT_OTHER): Payer: Medicare Other | Admitting: Hematology

## 2018-11-22 ENCOUNTER — Other Ambulatory Visit: Payer: Self-pay

## 2018-11-22 ENCOUNTER — Inpatient Hospital Stay: Payer: Medicare Other

## 2018-11-22 ENCOUNTER — Ambulatory Visit
Admission: RE | Admit: 2018-11-22 | Discharge: 2018-11-22 | Disposition: A | Discharge status: Home / Self Care | Payer: Medicare Other | Source: Ambulatory Visit | Attending: Radiation Oncology | Admitting: Radiation Oncology

## 2018-11-22 VITALS — BP 121/64 | HR 62 | Temp 97.8°F | Resp 17 | Ht 70.0 in | Wt 250.5 lb

## 2018-11-22 DIAGNOSIS — E46 Unspecified protein-calorie malnutrition: Secondary | ICD-10-CM | POA: Diagnosis not present

## 2018-11-22 DIAGNOSIS — G893 Neoplasm related pain (acute) (chronic): Secondary | ICD-10-CM | POA: Diagnosis not present

## 2018-11-22 DIAGNOSIS — T402X5A Adverse effect of other opioids, initial encounter: Secondary | ICD-10-CM | POA: Insufficient documentation

## 2018-11-22 DIAGNOSIS — T451X5A Adverse effect of antineoplastic and immunosuppressive drugs, initial encounter: Secondary | ICD-10-CM | POA: Diagnosis not present

## 2018-11-22 DIAGNOSIS — Z95828 Presence of other vascular implants and grafts: Secondary | ICD-10-CM | POA: Insufficient documentation

## 2018-11-22 DIAGNOSIS — Z510 Encounter for antineoplastic radiation therapy: Secondary | ICD-10-CM | POA: Diagnosis not present

## 2018-11-22 DIAGNOSIS — K5903 Drug induced constipation: Secondary | ICD-10-CM | POA: Diagnosis not present

## 2018-11-22 DIAGNOSIS — R110 Nausea: Secondary | ICD-10-CM | POA: Diagnosis not present

## 2018-11-22 DIAGNOSIS — K1231 Oral mucositis (ulcerative) due to antineoplastic therapy: Secondary | ICD-10-CM | POA: Insufficient documentation

## 2018-11-22 DIAGNOSIS — C01 Malignant neoplasm of base of tongue: Secondary | ICD-10-CM | POA: Diagnosis not present

## 2018-11-22 DIAGNOSIS — Z5111 Encounter for antineoplastic chemotherapy: Secondary | ICD-10-CM | POA: Diagnosis not present

## 2018-11-22 LAB — CBC WITH DIFFERENTIAL (CANCER CENTER ONLY)
Abs Immature Granulocytes: 0.16 10*3/uL — ABNORMAL HIGH (ref 0.00–0.07)
Basophils Absolute: 0 10*3/uL (ref 0.0–0.1)
Basophils Relative: 0 %
Eosinophils Absolute: 0.1 10*3/uL (ref 0.0–0.5)
Eosinophils Relative: 1 %
HCT: 42.3 % (ref 39.0–52.0)
Hemoglobin: 14.1 g/dL (ref 13.0–17.0)
Immature Granulocytes: 2 %
Lymphocytes Relative: 5 %
Lymphs Abs: 0.5 10*3/uL — ABNORMAL LOW (ref 0.7–4.0)
MCH: 29.9 pg (ref 26.0–34.0)
MCHC: 33.3 g/dL (ref 30.0–36.0)
MCV: 89.8 fL (ref 80.0–100.0)
Monocytes Absolute: 0.7 10*3/uL (ref 0.1–1.0)
Monocytes Relative: 7 %
Neutro Abs: 8.4 10*3/uL — ABNORMAL HIGH (ref 1.7–7.7)
Neutrophils Relative %: 85 %
Platelet Count: 189 10*3/uL (ref 150–400)
RBC: 4.71 MIL/uL (ref 4.22–5.81)
RDW: 13.3 % (ref 11.5–15.5)
WBC Count: 9.9 10*3/uL (ref 4.0–10.5)
nRBC: 0 % (ref 0.0–0.2)

## 2018-11-22 LAB — BASIC METABOLIC PANEL - CANCER CENTER ONLY
Anion gap: 9 (ref 5–15)
BUN: 17 mg/dL (ref 8–23)
CO2: 26 mmol/L (ref 22–32)
Calcium: 8.5 mg/dL — ABNORMAL LOW (ref 8.9–10.3)
Chloride: 106 mmol/L (ref 98–111)
Creatinine: 1.15 mg/dL (ref 0.61–1.24)
GFR, Est AFR Am: >60 mL/min (ref >60)
GFR, Estimated: >60 mL/min (ref >60)
Glucose, Bld: 102 mg/dL — ABNORMAL HIGH (ref 70–99)
Potassium: 4.2 mmol/L (ref 3.5–5.1)
Sodium: 141 mmol/L (ref 135–145)

## 2018-11-22 LAB — MAGNESIUM: Magnesium: 2.3 mg/dL (ref 1.7–2.4)

## 2018-11-22 MED ORDER — SODIUM CHLORIDE 0.9% FLUSH
10.0000 mL | INTRAVENOUS | Status: DC | PRN
  Administered 2018-11-22: 10 mL
  Filled 2018-11-22: qty 10

## 2018-11-22 MED ORDER — HEPARIN SOD (PORK) LOCK FLUSH 100 UNIT/ML IV SOLN
500.0000 [IU] | Freq: Once | INTRAVENOUS | Status: AC | PRN
  Administered 2018-11-22: 500 [IU]
  Filled 2018-11-22: qty 5

## 2018-11-22 NOTE — Progress Notes (Signed)
Met with patient at registration to introduce myself as Arboriculturist and to offer available resources.  There are no foundations with copay assistance for his diagnosis.  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 Based on verbal income provided, patient does qualify for a household of 1. Advised patient to bring proof of income on 9/17 if able to complete application. He verbalized understanding.  Gave him my card for any additional financial questions or concerns.

## 2018-11-22 NOTE — Progress Notes (Signed)
[facility] OFFICE PROGRESS NOTE  Patient Care Team: Bartholome Bill, MD as PCP - General (Family Medicine) O'Neal, Cassie Freer, MD as PCP - Cardiology (Cardiology) Eppie Gibson, MD as Attending Physician (Radiation Oncology) Leota Sauers, RN as Oncology Nurse Navigator Schinke, Perry Mount, [place] as Speech Language Pathologist (Speech Pathology) Karie Mainland, RD as Dietitian (Nutrition) Kennith Center, LCSW as Social Worker  HEME/ONC OVERVIEW: 1. 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 A-fib on Eliquis   2. 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, p16+ -S/p 1 cycle of weekly carboplatin concurrent with RT in the definitive setting  -Labs adequate today, proceed with Cycle 2 of weekly carboplatin -Plan for 7 cycles -PRN antiemetics, Zofran, Compazine, dexamethasone, Ativan  Chemotherapy-associated nausea  -Secondary to chemotherapy -Symptoms relatively well controlled  -Continue PRN-anti-emetics   Cancer-related pain -Secondary to the SCCa of the tongue and LN involvement -Unable to tolerate IR liquid morphine -Pain currently well controlled with oxycodone 5mg  q6hrs PRN for pain  -Continue the regimen above   Opioid-induced constipation -Secondary to opioid medication  -Currently taking Dulcolax, Colace and PRN Milk of Mag -I encouraged the patient to add daily Miralax and maintain adequate hydration -No enema due to the  risk of bacterial translocation   Stage II-III CKD -Baseline Cr ~1.2 -Cr 1.15 today, stable; electrolytes normal -We will monitor it closely   No orders of the defined types were placed in this encounter.  All questions were answered. The patient knows to call the clinic with any problems, questions or concerns. No barriers to learning was detected.  Return in 1 week for labs, port flush, clinic appt and Cycle 3 of weekly carboplatin.   [name], MD 11/22/2018 11:24 AM  CHIEF COMPLAINT: "I am just still constipated"  INTERVAL HISTORY: [name] returns to clinic for follow-up of squamous cell carcinoma of the base of the tongue on definitive chemoradiation.  The patient reports that he still has persistent constipation, for which he had to take milk of magnesia as needed feeding tube yesterday that finally helped that his bowel movement.  He has been taking oxycodone approximately once every 2 days for tongue and the right-sided neck pain with adequate pain relief.  He also reported one episode of nausea yesterday, for which Compazine helped.  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  He denies any other complaint today.  SUMMARY OF ONCOLOGIC HISTORY: Oncology History  Cancer of base of tongue ([place])  07/29/2018 Imaging   Neck ultrasound: IMPRESSION: 16 x 30 mm solid mass in the right submandibular area. This may represent a submandibular mass or enlarged lymph node. Recommend CT neck with contrast for further evaluation.   08/03/2018 Imaging   CT neck (at [facility]): IMPRESSION: 1. 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 There is a single ipsilateral malignant lymph node measuring  3 cm. 2. Nodular thickening along the anterior right sublingual gland, attention on follow-up PET.   09/04/2018 Procedure   US-guided bx of the R  submandibular mass   09/04/2018 Pathology Results   Accession: [id number]  Lymph node, needle/core biopsy, right submandibular - SQUAMOUS CELL CARCINOMA. - SEE MICROSCOPIC DESCRIPTION.   09/11/2018 Imaging   PET: IMPRESSION: Hypermetabolism along the right base of tongue, corresponding to the patient's known primary oropharyngeal cancer.   Ipsilateral level 2 cervical nodal metastasis.   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   11/16/2018 -  Chemotherapy   The patient had palonosetron (ALOXI) injection 0.25 mg, 0.25 mg, Intravenous,  Once, 1 of 7 cycles Administration: 0.25 mg (11/16/2018) CARBOplatin (PARAPLATIN) 200 mg in sodium chloride 0.9 % 250 mL chemo infusion, 200 mg (100 % of original dose 202.8 mg), Intravenous,  Once, 1 of 7 cycles Dose modification: 202.8 mg (original dose 202.8 mg, Cycle 1) Administration: 200 mg (11/16/2018)  for chemotherapy treatment.      REVIEW OF SYSTEMS:   Constitutional: ( - ) fevers, ( - )  chills , ( - ) night sweats Eyes: ( - ) blurriness of vision, ( - ) double vision, ( - ) watery eyes Ears, nose, mouth, throat, and face: ( - ) mucositis, ( + ) sore throat Respiratory: ( - ) cough, ( - ) dyspnea, ( - ) wheezes Cardiovascular: ( - ) palpitation, ( - ) chest discomfort, ( - ) lower extremity swelling Gastrointestinal:  ( + ) nausea, ( - ) heartburn, ( + ) change in bowel habits Skin: ( - ) abnormal skin rashes Lymphatics: ( - ) new lymphadenopathy, ( - ) easy bruising Neurological: ( - ) numbness, ( - ) tingling, ( - ) new weaknesses Behavioral/Psych: ( - ) mood change, ( - ) new changes  All other systems were reviewed with the patient and are negative.  I have reviewed the past medical history, past surgical history, social history and family history with the patient and they are unchanged from previous note.  ALLERGIES:  is allergic to  lisinopril.  MEDICATIONS:  Current Outpatient Medications  Medication Sig Dispense Refill  . Apixaban (ELIQUIS PO) Take 5 mg by mouth 2 (two) times daily.     Marland Kitchen docusate sodium (COLACE) 100 MG capsule Take 100 mg by mouth as directed. Take 100 mg in AM,  200 mg in PM.    . hydrochlorothiazide (HYDRODIURIL) 25 MG tablet Take 1 tablet (25 mg total) by mouth daily. 90 tablet 3  . lidocaine-prilocaine (EMLA) cream Apply to affected area once 30 g 3  . LORazepam (ATIVAN) 0.5 MG tablet Take 1 tablet (0.5 mg total) by mouth every 6 (six) hours as needed (Nausea or vomiting). 30 tablet 0  . metoprolol succinate (TOPROL-XL) 100 MG 24 hr tablet Take 1 tablet (100 mg total) by mouth daily. Take with or immediately following a meal. 90 tablet 3  . omeprazole (PRILOSEC) 20 MG capsule Take 20 mg by mouth daily.    . ondansetron (ZOFRAN) 8 MG tablet Take 1 tablet (8 mg total) by mouth 2 (two) times daily as needed for refractory nausea / vomiting. Start on day 3 after chemotherapy. 30 tablet 1  . oxyCODONE (OXY IR/ROXICODONE) 5 MG immediate release tablet Take 1 tablet (5 mg total) by mouth every 6 (six) hours as needed for severe pain. 60 tablet 0  . prochlorperazine (COMPAZINE) 10 MG  tablet Take 1 tablet (10 mg total) by mouth every 6 (six) hours as needed (Nausea or vomiting). 30 tablet 1  . tamsulosin (FLOMAX) 0.4 MG CAPS capsule Take 0.4 mg by mouth daily after supper.    . dexamethasone (DECADRON) 4 MG tablet Take 2 tablets (8 mg total) by mouth daily. Start the day after chemotherapy for 2 days. Take with food. (Patient not taking: Reported on 11/22/2018) 30 tablet 1  . HYDROcodone-acetaminophen (NORCO/VICODIN) 5-325 MG tablet Take 1 tablet by mouth every 4 (four) hours as needed for moderate pain.    . Magnesium Citrate 100 MG TABS Take 1 tablet by mouth daily.     No current facility-administered medications for this visit.     PHYSICAL EXAMINATION: ECOG PERFORMANCE STATUS: 2 - Symptomatic, <50%  confined to bed  Today's Vitals   11/22/18 1106 11/22/18 1114  BP: 121/64   Pulse: 62   Resp: 17   Temp: 97.8 F (36.6 C)   TempSrc: Oral   SpO2: 100%   Weight: 250 lb 8 oz (113.6 kg)   Height: 5\' 10"  (1.778 m)   PainSc:  3    Body mass index is 35.94 kg/m.  Filed Weights   11/22/18 1106  Weight: 250 lb 8 oz (113.6 kg)    GENERAL: alert, no distress and comfortable SKIN: skin color, texture, turgor are normal, no rashes or significant lesions EYES: conjunctiva are pink and non-injected, sclera clear OROPHARYNX: no exudate, no erythema; lips, buccal mucosa, and tongue normal  NECK: supple, non-tender LYMPH:  ~2cm, mobile, right Level II cervical LN, stable  LUNGS: clear to auscultation with normal breathing effort HEART: regular rate & rhythm and no murmurs and no lower extremity edema ABDOMEN: soft, non-tender, non-distended, normal bowel sounds Musculoskeletal: no cyanosis of digits and no clubbing  PSYCH: alert & oriented x 3, fluent speech NEURO: no focal motor/sensory deficits  LABORATORY DATA:  I have reviewed the data as listed    Component Value Date/Time   NA 140 11/15/2018 1051   K 3.8 11/15/2018 1051   CL 106 11/15/2018 1051   CO2 24 11/15/2018 1051   GLUCOSE 96 11/15/2018 1051   BUN 17 11/15/2018 1051   CREATININE 1.24 11/15/2018 1051   CALCIUM 8.7 (L) 11/15/2018 1051   PROT 6.6 10/18/2018 0846   ALBUMIN 3.9 10/18/2018 0846   AST 21 10/18/2018 0846   ALT 19 10/18/2018 0846   ALKPHOS 39 10/18/2018 0846   BILITOT 0.6 10/18/2018 0846   GFRNONAA 57 (L) 11/15/2018 1051   GFRAA >60 11/15/2018 1051    No results found for: SPEP, UPEP  Lab Results  Component Value Date   WBC 9.9 11/22/2018   NEUTROABS 8.4 (H) 11/22/2018   HGB 14.1 11/22/2018   HCT 42.3 11/22/2018   MCV 89.8 11/22/2018   PLT 189 11/22/2018      Chemistry      Component Value Date/Time   NA 140 11/15/2018 1051   K 3.8 11/15/2018 1051   CL 106 11/15/2018 1051   CO2 24  11/15/2018 1051   BUN 17 11/15/2018 1051   CREATININE 1.24 11/15/2018 1051      Component Value Date/Time   CALCIUM 8.7 (L) 11/15/2018 1051   ALKPHOS 39 10/18/2018 0846   AST 21 10/18/2018 0846   ALT 19 10/18/2018 0846   BILITOT 0.6 10/18/2018 0846       RADIOGRAPHIC STUDIES: I have personally reviewed the radiological images as listed below and agreed with the findings in  the report. Ir Gastrostomy Tube Mod Sed  Result Date: 11/07/2018 INDICATION: 73 year old male with lingual squamous cell carcinoma. He presents for combination procedure with placement of a percutaneous gastrostomy tube and port catheter. EXAM: IMPLANTED PORT A CATH PLACEMENT WITH ULTRASOUND AND FLUOROSCOPIC GUIDANCE IR percutaneous gastrostomy tube placement MEDICATIONS: 2 g Ancef; The antibiotic was administered within an appropriate time interval prior to skin puncture. ANESTHESIA/SEDATION: Versed 6 mg IV; Fentanyl 200 mcg IV; Moderate Sedation Time:  53 minutes The patient was continuously monitored during the procedure by the interventional radiology nurse under my direct supervision. FLUOROSCOPY TIME:  2 minutes, 6 seconds (Q000111Q mGy) COMPLICATIONS: None immediate. PROCEDURE: PORT PLACEMENT The right neck and chest was prepped with chlorhexidine, and draped in the usual sterile fashion using maximum barrier technique (cap and mask, sterile gown, sterile gloves, large sterile sheet, hand hygiene and cutaneous antiseptic). Local anesthesia was attained by infiltration with 1% lidocaine with epinephrine. Ultrasound demonstrated patency of the right internal jugular vein, and this was documented with an image. Under real-time ultrasound guidance, this vein was accessed with a 21 gauge micropuncture needle and image documentation was performed. A small dermatotomy was made at the access site with an 11 scalpel. A 0.018" wire was advanced into the SVC and the access needle exchanged for a 29F micropuncture vascular sheath. The 0.018"  wire was then removed and a 0.035" wire advanced into the IVC. An appropriate location for the subcutaneous reservoir was selected below the clavicle and an incision was made through the skin and underlying soft tissues. The subcutaneous tissues were then dissected using a combination of blunt and sharp surgical technique and a pocket was formed. A single lumen power injectable portacatheter was then tunneled through the subcutaneous tissues from the pocket to the dermatotomy and the port reservoir placed within the subcutaneous pocket. The venous access site was then serially dilated and a peel away vascular sheath placed over the wire. The wire was removed and the port catheter advanced into position under fluoroscopic guidance. The catheter tip is positioned in the superior cavoatrial junction. This was documented with a spot image. The portacatheter was then tested and found to flush and aspirate well. The port was flushed with saline followed by 100 units/mL heparinized saline. The pocket was then closed in two layers using first subdermal inverted interrupted absorbable sutures followed by a running subcuticular suture. The epidermis was then sealed with Dermabond. The dermatotomy at the venous access site was also closed with Dermabond. GASTROSTOMY TUBE PLACEMENT Maximal barrier sterile technique utilized including caps, mask, sterile gowns, sterile gloves, large sterile drape, hand hygiene, and chlorhexadine skin prep. An angled catheter was advanced over a wire under fluoroscopic guidance through the nose, down the esophagus and into the body of the stomach. The stomach was then insufflated with several 100 ml of air. Fluoroscopy confirmed location of the gastric bubble, as well as inferior displacement of the barium stained colon. Under direct fluoroscopic guidance, a single T-tack was placed, and the anterior gastric wall drawn up against the anterior abdominal wall. Percutaneous access was then obtained  into the mid gastric body with an 18 gauge sheath needle. Aspiration of air, and injection of contrast material under fluoroscopy confirmed needle placement. An Amplatz wire was advanced in the gastric body and the access needle exchanged for a 9-French vascular sheath. A snare device was advanced through the vascular sheath and an Amplatz wire advanced through the angled catheter. The Amplatz wire was successfully snared and this was  pulled up through the esophagus and out the mouth. A 20-French Alinda Dooms MIC-PEG tube was then connected to the snare and pulled through the mouth, down the esophagus, into the stomach and out to the anterior abdominal wall. Hand injection of contrast material confirmed intragastric location. The T-tack retention suture was then cut. The pull through peg tube was then secured with the external bumper and capped. The patient will be observed for several hours with the newly placed tube on low wall suction to evaluate for any post procedure complication. The patient tolerated the procedure well, there is no immediate complication. IMPRESSION: 1. Successful placement of a right IJ approach Power Port with ultrasound and fluoroscopic guidance. The catheter is ready for use. 2. Successful placement of a 20 French pull-through percutaneous gastrostomy tube. Signed, Criselda Peaches, MD, [facility] Radiology Electronically Signed   By: Jacqulynn Cadet M.D.   On: 11/07/2018 16:15   Ir Imaging Guided Port Insertion  Result Date: 11/07/2018 INDICATION: 73 year old male with lingual squamous cell carcinoma. He presents for combination procedure with placement of a percutaneous gastrostomy tube and port catheter. EXAM: IMPLANTED PORT A CATH PLACEMENT WITH ULTRASOUND AND FLUOROSCOPIC GUIDANCE IR percutaneous gastrostomy tube placement MEDICATIONS: 2 g Ancef; The antibiotic was administered within an appropriate time interval prior  to skin puncture. ANESTHESIA/SEDATION: Versed 6 mg IV; Fentanyl 200 mcg IV; Moderate Sedation Time:  53 minutes The patient was continuously monitored during the procedure by the interventional radiology nurse under my direct supervision. FLUOROSCOPY TIME:  2 minutes, 6 seconds (Q000111Q mGy) COMPLICATIONS: None immediate. PROCEDURE: PORT PLACEMENT The right neck and chest was prepped with chlorhexidine, and draped in the usual sterile fashion using maximum barrier technique (cap and mask, sterile gown, sterile gloves, large sterile sheet, hand hygiene and cutaneous antiseptic). Local anesthesia was attained by infiltration with 1% lidocaine with epinephrine. Ultrasound demonstrated patency of the right internal jugular vein, and this was documented with an image. Under real-time ultrasound guidance, this vein was accessed with a 21 gauge micropuncture needle and image documentation was performed. A small dermatotomy was made at the access site with an 11 scalpel. A 0.018" wire was advanced into the SVC and the access needle exchanged for a 31F micropuncture vascular sheath. The 0.018" wire was then removed and a 0.035" wire advanced into the IVC. An appropriate location for the subcutaneous reservoir was selected below the clavicle and an incision was made through the skin and underlying soft tissues. The subcutaneous tissues were then dissected using a combination of blunt and sharp surgical technique and a pocket was formed. A single lumen power injectable portacatheter was then tunneled through the subcutaneous tissues from the pocket to the dermatotomy and the port reservoir placed within the subcutaneous pocket. The venous access site was then serially dilated and a peel away vascular sheath placed over the wire. The wire was removed and the port catheter advanced into position under fluoroscopic guidance. The catheter tip is positioned in the superior cavoatrial junction. This was documented with a spot image. The  portacatheter was then tested and found to flush and aspirate well. The port was flushed with saline followed by 100 units/mL heparinized saline. The pocket was then closed in two layers using first subdermal inverted interrupted absorbable sutures followed by a running subcuticular suture. The epidermis was then sealed with Dermabond. The dermatotomy at the venous access site was also closed with Dermabond. GASTROSTOMY TUBE PLACEMENT Maximal barrier sterile technique utilized including  caps, mask, sterile gowns, sterile gloves, large sterile drape, hand hygiene, and chlorhexadine skin prep. An angled catheter was advanced over a wire under fluoroscopic guidance through the nose, down the esophagus and into the body of the stomach. The stomach was then insufflated with several 100 ml of air. Fluoroscopy confirmed location of the gastric bubble, as well as inferior displacement of the barium stained colon. Under direct fluoroscopic guidance, a single T-tack was placed, and the anterior gastric wall drawn up against the anterior abdominal wall. Percutaneous access was then obtained into the mid gastric body with an 18 gauge sheath needle. Aspiration of air, and injection of contrast material under fluoroscopy confirmed needle placement. An Amplatz wire was advanced in the gastric body and the access needle exchanged for a 9-French vascular sheath. A snare device was advanced through the vascular sheath and an Amplatz wire advanced through the angled catheter. The Amplatz wire was successfully snared and this was pulled up through the esophagus and out the mouth. A 20-French Alinda Dooms MIC-PEG tube was then connected to the snare and pulled through the mouth, down the esophagus, into the stomach and out to the anterior abdominal wall. Hand injection of contrast material confirmed intragastric location. The T-tack retention suture was then cut. The pull through peg tube was then secured with the external bumper and  capped. The patient will be observed for several hours with the newly placed tube on low wall suction to evaluate for any post procedure complication. The patient tolerated the procedure well, there is no immediate complication. IMPRESSION: 1. Successful placement of a right IJ approach Power Port with ultrasound and fluoroscopic guidance. The catheter is ready for use. 2. Successful placement of a 20 French pull-through percutaneous gastrostomy tube. Signed, Criselda Peaches, MD, [facility] Radiology Electronically Signed   By: Jacqulynn Cadet M.D.   On: 11/07/2018 16:15

## 2018-11-23 ENCOUNTER — Inpatient Hospital Stay: Payer: Medicare Other | Admitting: Nutrition

## 2018-11-23 ENCOUNTER — Inpatient Hospital Stay: Payer: Medicare Other

## 2018-11-23 ENCOUNTER — Other Ambulatory Visit: Payer: Self-pay

## 2018-11-23 ENCOUNTER — Ambulatory Visit
Admission: RE | Admit: 2018-11-23 | Discharge: 2018-11-23 | Disposition: A | Discharge status: Home / Self Care | Payer: Medicare Other | Source: Ambulatory Visit | Attending: Radiation Oncology | Admitting: Radiation Oncology

## 2018-11-23 ENCOUNTER — Encounter: Payer: Self-pay | Admitting: Hematology

## 2018-11-23 VITALS — BP 136/84 | HR 56 | Temp 97.6°F | Resp 18

## 2018-11-23 DIAGNOSIS — C01 Malignant neoplasm of base of tongue: Secondary | ICD-10-CM | POA: Diagnosis not present

## 2018-11-23 DIAGNOSIS — K5903 Drug induced constipation: Secondary | ICD-10-CM | POA: Diagnosis not present

## 2018-11-23 DIAGNOSIS — Z5111 Encounter for antineoplastic chemotherapy: Secondary | ICD-10-CM | POA: Diagnosis not present

## 2018-11-23 DIAGNOSIS — G893 Neoplasm related pain (acute) (chronic): Secondary | ICD-10-CM | POA: Diagnosis not present

## 2018-11-23 DIAGNOSIS — E46 Unspecified protein-calorie malnutrition: Secondary | ICD-10-CM | POA: Diagnosis not present

## 2018-11-23 DIAGNOSIS — R110 Nausea: Secondary | ICD-10-CM | POA: Diagnosis not present

## 2018-11-23 DIAGNOSIS — Z510 Encounter for antineoplastic radiation therapy: Secondary | ICD-10-CM | POA: Diagnosis not present

## 2018-11-23 MED ORDER — DEXAMETHASONE SODIUM PHOSPHATE 10 MG/ML IJ SOLN
INTRAMUSCULAR | Status: AC
  Filled 2018-11-23: qty 1

## 2018-11-23 MED ORDER — SODIUM CHLORIDE 0.9 % IV SOLN
Freq: Once | INTRAVENOUS | Status: AC
  Administered 2018-11-23: 11:00:00 via INTRAVENOUS
  Filled 2018-11-23: qty 250

## 2018-11-23 MED ORDER — PALONOSETRON HCL INJECTION 0.25 MG/5ML
INTRAVENOUS | Status: AC
  Filled 2018-11-23: qty 5

## 2018-11-23 MED ORDER — SODIUM CHLORIDE 0.9 % IV SOLN
202.8000 mg | Freq: Once | INTRAVENOUS | Status: AC
  Administered 2018-11-23: 200 mg via INTRAVENOUS
  Filled 2018-11-23: qty 20

## 2018-11-23 MED ORDER — DEXAMETHASONE SODIUM PHOSPHATE 10 MG/ML IJ SOLN
10.0000 mg | Freq: Once | INTRAMUSCULAR | Status: AC
  Administered 2018-11-23: 10 mg via INTRAVENOUS

## 2018-11-23 MED ORDER — PALONOSETRON HCL INJECTION 0.25 MG/5ML
0.2500 mg | Freq: Once | INTRAVENOUS | Status: AC
  Administered 2018-11-23: 11:00:00 0.25 mg via INTRAVENOUS

## 2018-11-23 MED ORDER — HEPARIN SOD (PORK) LOCK FLUSH 100 UNIT/ML IV SOLN
500.0000 [IU] | Freq: Once | INTRAVENOUS | Status: AC | PRN
  Administered 2018-11-23: 500 [IU]
  Filled 2018-11-23: qty 5

## 2018-11-23 MED ORDER — SODIUM CHLORIDE 0.9% FLUSH
10.0000 mL | INTRAVENOUS | Status: DC | PRN
  Administered 2018-11-23: 12:00:00 10 mL
  Filled 2018-11-23: qty 10

## 2018-11-23 NOTE — Progress Notes (Signed)
Nutrition follow-up completed with patient receiving treatment for tongue cancer. Weight decreased and documented as 250.5 pounds September 16 down from 259 pounds August 12. Patient is drinking Ensure and boost occasionally. He drinks 64 ounces of water daily. Reports constipation has improved. He is having some minor painful swallowing. Reports his gums are healing and he has been able to chew some softer foods.  Nutrition diagnosis:  Food and nutrition related knowledge deficit improved. Predicted suboptimal energy intake has evolved into inadequate oral intake related to tongue cancer as evidenced by 9 pound weight loss.  Intervention: Patient was educated to continue strategies for smaller more frequent meals and snacks with adequate calories and protein to minimize weight loss. Encourage patient to continue increased water consumption. Recommend patient choose Ensure Enlive and provided coupons. Questions were answered.  Teach back method used.  Monitoring, evaluation, goals: Patient will tolerate adequate calories and protein to minimize further weight loss.  Next visit: Thursday, September 24 by telephone.  **Disclaimer: This note was dictated with voice recognition software. Similar sounding words can inadvertently be transcribed and this note may contain transcription errors which may not have been corrected upon publication of note.**

## 2018-11-23 NOTE — Progress Notes (Signed)
Per MD will keep current carboplatin dose. If next dose calculated is still more than 10% will consider changing at that time.

## 2018-11-23 NOTE — Progress Notes (Signed)
Met with patient at registration whom brought proof of income for the grant.  Patient approved for the one-time $700 [place] to assist with personal expenses while going through treatment. He received a gas card today from his grant. Gave him a copy of the approval letter as well as the expense sheet with the outpatient pharmacy information. Showed him the mailbox if needed to utilize to drop bills from expense sheet. He was very Patent attorney.  He has my card for any additional financial questions or concerns.

## 2018-11-23 NOTE — Patient Instructions (Signed)
[facility] Discharge Instructions for Patients Receiving Chemotherapy  Today you received the following chemotherapy agents Carboplatin (PARAPLATIN).  To help prevent nausea and vomiting after your treatment, we encourage you to take your nausea medication as prescribed.   If you develop nausea and vomiting that is not controlled by your nausea medication, call the clinic.   BELOW ARE SYMPTOMS THAT SHOULD BE REPORTED IMMEDIATELY:  *FEVER GREATER THAN 100.5 F  *CHILLS WITH OR WITHOUT FEVER  NAUSEA AND VOMITING THAT IS NOT CONTROLLED WITH YOUR NAUSEA MEDICATION  *UNUSUAL SHORTNESS OF BREATH  *UNUSUAL BRUISING OR BLEEDING  TENDERNESS IN MOUTH AND THROAT WITH OR WITHOUT PRESENCE OF ULCERS  *URINARY PROBLEMS  *BOWEL PROBLEMS  UNUSUAL RASH Items with * indicate a potential emergency and should be followed up as soon as possible.  Feel free to call the clinic should you have any questions or concerns. The clinic phone number is [phone].  Please show the CHEMO ALERT CARD at check-in to the Emergency Department and triage nurse.  Coronavirus (COVID-19) Are you at risk?  Are you at risk for the Coronavirus (COVID-19)?  To be considered HIGH RISK for Coronavirus (COVID-19), you have to meet the following criteria:  . Traveled to [place], [place], [place], [place] or [place]; or in the [place] to [place], [place], [place], or [place]; and have fever, cough, and shortness of breath within the last 2 weeks of travel OR . Been in close contact with a person diagnosed with COVID-19 within the last 2 weeks and have fever, cough, and shortness of breath . IF YOU DO NOT MEET THESE CRITERIA, YOU ARE CONSIDERED LOW RISK FOR COVID-19.  What to do if you are HIGH RISK for COVID-19?  . If you are having a medical emergency, call 911. . Seek medical care right away. Before you go to a doctor's office, urgent care or emergency department, call ahead and tell  them about your recent travel, contact with someone diagnosed with COVID-19, and your symptoms. You should receive instructions from your physician's office regarding next steps of care.  . When you arrive at healthcare provider, tell the healthcare staff immediately you have returned from visiting [place], [place], [place], [place] or [place]; or traveled in the [place] to [place], [place], [place], or [place]; in the last two weeks or you have been in close contact with a person diagnosed with COVID-19 in the last 2 weeks.   . Tell the health care staff about your symptoms: fever, cough and shortness of breath. . After you have been seen by a medical provider, you will be either: o Tested for (COVID-19) and discharged home on quarantine except to seek medical care if symptoms worsen, and asked to  - Stay home and avoid contact with others until you get your results (4-5 days)  - Avoid travel on public transportation if possible (such as bus, train, or airplane) or o Sent to the Emergency Department by EMS for evaluation, COVID-19 testing, and possible admission depending on your condition and test results.  What to do if you are LOW RISK for COVID-19?  Reduce your risk of any infection by using the same precautions used for avoiding the common cold or flu:  . Wash your hands often with soap and warm water for at least 20 seconds.  If soap and water are not readily available, use an alcohol-based hand sanitizer with at least 60% alcohol.  . If coughing or   sneezing, cover your mouth and nose by coughing or sneezing into the elbow areas of your shirt or coat, into a tissue or into your sleeve (not your hands). . Avoid shaking hands with others and consider head nods or verbal greetings only. . Avoid touching your eyes, nose, or mouth with unwashed hands.  . Avoid close contact with people who are sick. . Avoid places or events with large numbers of people in one location, like concerts or  sporting events. . Carefully consider travel plans you have or are making. . If you are planning any travel outside or inside the [place], visit the CDC's Travelers' Health webpage for the latest health notices. . If you have some symptoms but not all symptoms, continue to monitor at home and seek medical attention if your symptoms worsen. . If you are having a medical emergency, call 911.   ADDITIONAL HEALTHCARE OPTIONS FOR PATIENTS  [facility] / e-Visit: [email or web address]         [facility]: [phone]  [facility]: [phone]                   [facility]: [phone]   

## 2018-11-24 ENCOUNTER — Other Ambulatory Visit: Payer: Self-pay

## 2018-11-24 ENCOUNTER — Ambulatory Visit
Admission: RE | Admit: 2018-11-24 | Discharge: 2018-11-24 | Disposition: A | Discharge status: Home / Self Care | Payer: Medicare Other | Source: Ambulatory Visit | Attending: Radiation Oncology | Admitting: Radiation Oncology

## 2018-11-24 DIAGNOSIS — C01 Malignant neoplasm of base of tongue: Secondary | ICD-10-CM | POA: Diagnosis not present

## 2018-11-24 DIAGNOSIS — Z510 Encounter for antineoplastic radiation therapy: Secondary | ICD-10-CM | POA: Diagnosis not present

## 2018-11-27 ENCOUNTER — Other Ambulatory Visit: Payer: Self-pay | Admitting: Radiation Oncology

## 2018-11-27 ENCOUNTER — Other Ambulatory Visit: Payer: Self-pay

## 2018-11-27 ENCOUNTER — Ambulatory Visit: Payer: Medicare Other | Attending: Radiation Oncology

## 2018-11-27 ENCOUNTER — Ambulatory Visit
Admission: RE | Admit: 2018-11-27 | Discharge: 2018-11-27 | Disposition: A | Discharge status: Home / Self Care | Payer: Medicare Other | Source: Ambulatory Visit | Attending: Radiation Oncology | Admitting: Radiation Oncology

## 2018-11-27 DIAGNOSIS — Z510 Encounter for antineoplastic radiation therapy: Secondary | ICD-10-CM | POA: Diagnosis not present

## 2018-11-27 DIAGNOSIS — R1310 Dysphagia, unspecified: Secondary | ICD-10-CM | POA: Insufficient documentation

## 2018-11-27 DIAGNOSIS — C01 Malignant neoplasm of base of tongue: Secondary | ICD-10-CM

## 2018-11-27 MED ORDER — LIDOCAINE VISCOUS HCL 2 % MT SOLN: OROMUCOSAL | 5 refills | Status: DC

## 2018-11-27 NOTE — Therapy (Signed)
[facility] [address] [place], [place], [address] Phone: [phone]   Fax:  [phone]  Speech Language Pathology Treatment  Patient Details  Name: Neil [name] MRN: [id number] Date of Birth: [birth year] Referring Provider (SLP): Lonie Peak, MD   Encounter Date: 11/27/2018  End of Session - 11/27/18 1106    Visit Number  2    Number of Visits  4    Date for SLP Re-Evaluation  01/26/19    SLP Start Time  1030    SLP Stop Time   1055    SLP Time Calculation (min)  25 min    Activity Tolerance  Patient tolerated treatment well       Past Medical History:  Diagnosis Date  . Atrial fibrillation (HCC)   . Chronic anticoagulation    On Eliquis  . GERD (gastroesophageal reflux disease)   . Hypertension   . Joint pain     Past Surgical History:  Procedure Laterality Date  . HEMORROIDECTOMY  2004  . HERNIA REPAIR  1960  . IR GASTROSTOMY TUBE MOD SED  11/07/2018  . IR IMAGING GUIDED PORT INSERTION  11/07/2018  . IR US GUIDANCE  09/04/2018  . IR US GUIDE BX ASP/DRAIN  09/04/2018  . MOUTH BIOPSY  09/04/2018   right jaw  . right testical removed    . TONSILLECTOMY      There were no vitals filed for this visit.  Subjective Assessment - 11/27/18 1107    Subjective  Pt eating "everything" but takes more time to chew, uses liquid wash, and cuts bites smaller.    Currently in Pain?  Yes    Pain Location  Throat    Pain Orientation  Right    Pain Descriptors / Indicators  Aching    Pain Type  Acute pain    Pain Onset  In the past 7 days    Pain Frequency  Intermittent    Aggravating Factors   when swallowing    Pain Relieving Factors  meds            ADULT SLP TREATMENT - 11/27/18 1109      General Information   Behavior/Cognition  Alert;Cooperative;Pleasant mood      Treatment Provided   Treatment provided  Dysphagia      Dysphagia Treatment   Temperature Spikes Noted  No    Respiratory Status   Room air    Treatment Methods  Patient/caregiver education    Patient observed directly with PO's  No    Other treatment/comments  Pt reports needing liquid wash, multiple swallows, chewing food thoroughly, smaller bites in order to eat solids like chicken. Also eating other softer foods. Pt is not doing scope of HEP (no Mednelsohn). SLP re-educated pt to do entirety of HEP for best outcomes. SLP had to use mod cues pt for rationale for HEP      Assessment / Recommendations / Plan   Plan  Continue with current plan of care      Dysphagia Recommendations   Diet recommendations  --   as tolerated   Liquids provided via  Cup    Medication Administration  --   as tolerated     Progression Toward Goals   Progression toward goals  Progressing toward goals       SLP Education - 11/27/18 1037    Education Details  Creamy, soft, mushy foods may be more necessary in next four weeks, need to do entirety of  HEP for best outcomes    Person(s) Educated  Patient    Methods  Explanation    Comprehension  Verbalized understanding       SLP Short Term Goals - 11/27/18 1422      SLP SHORT TERM GOAL #1   Title  Pt will demo understanding of proper procedure for HEP with rare min A    Period  --   session   Status  Partially Met      SLP SHORT TERM GOAL #2   Title  pt will tell SLP why he is completing HEP    Period  --   session   Status  Partially Met       SLP Long Term Goals - 11/27/18 1423      SLP LONG TERM GOAL #1   Title  pt will demo understanding of proper HEP procedure with modified indpendence    Time  2    Period  --   sessions   Status  On-going      SLP LONG TERM GOAL #2   Title  pt will tell SLP 3 overt s/sx aspiration PNA with modified independence    Time  2    Period  --   sessions   Status  On-going      SLP LONG TERM GOAL #3   Title  pt will tell the benefit of a food journal for return to pre-chemorad diet    Time  2    Period  --   sessions   Status   On-going       Plan - 11/27/18 1113    Clinical Impression Statement  Pt presents today with WFL/WNL swalowing as reported by pt with compensations in "other treatment/comments".  No overt s/sx of aspiration PNA noted today, nor reported. Pt was provided with HEP today targeting musculature possibly affected by radiation. Data show that probability of dysphagia increases during chemorad tx and afterwards, and is mitigated by HEP focusing on swallowing musculature. Pt would benefit from follow up skilled ST to assess pt's safety with POs and also to assess pt's ability to correctly perform HEP. Visits will be performed virtually (telephone or WE[employer]ex) or in - person.    Speech Therapy Frequency  --   approx once every 4 weeks   Duration  --   90 days (4 total sessions)   Treatment/Interventions  Aspiration precaution training;Pharyngeal strengthening exercises;Diet toleration management by SLP;Internal/external aids;Patient/family education;SLP instruction and feedback;Trials of upgraded texture/liquids;Environmental controls    Potential to Achieve Goals  Good    SLP Home Exercise Plan  provided today       Patient will benefit from skilled therapeutic intervention in order to improve the following deficits and impairments:   Dysphagia, unspecified type    Problem List Patient Active Problem List   Diagnosis Date Noted  . Port-A-Cath in place 11/22/2018  . Chemotherapy-induced nausea 11/22/2018  . Cancer-related pain 11/22/2018  . Constipation due to opioid therapy 11/22/2018  . CKD (chronic kidney disease), stage III (HCC) 10/18/2018  . Cancer of base of tongue (HCC) 10/16/2018    [place] 11/27/2018, 2:24 PM  [facility] [address] [place], [place], [address] Phone: [phone]   Fax:  [phone]   Name: Neil [name] MRN: [id number] Date of Birth: 19-Apr-1945

## 2018-11-28 ENCOUNTER — Ambulatory Visit
Admission: RE | Admit: 2018-11-28 | Discharge: 2018-11-28 | Disposition: A | Discharge status: Home / Self Care | Payer: Medicare Other | Source: Ambulatory Visit | Attending: Radiation Oncology | Admitting: Radiation Oncology

## 2018-11-28 ENCOUNTER — Other Ambulatory Visit: Payer: Self-pay

## 2018-11-28 DIAGNOSIS — C01 Malignant neoplasm of base of tongue: Secondary | ICD-10-CM | POA: Diagnosis not present

## 2018-11-28 DIAGNOSIS — Z510 Encounter for antineoplastic radiation therapy: Secondary | ICD-10-CM | POA: Diagnosis not present

## 2018-11-29 ENCOUNTER — Inpatient Hospital Stay: Payer: Medicare Other

## 2018-11-29 ENCOUNTER — Ambulatory Visit
Admission: RE | Admit: 2018-11-29 | Discharge: 2018-11-29 | Disposition: A | Discharge status: Home / Self Care | Payer: Medicare Other | Source: Ambulatory Visit | Attending: Radiation Oncology | Admitting: Radiation Oncology

## 2018-11-29 ENCOUNTER — Inpatient Hospital Stay (HOSPITAL_BASED_OUTPATIENT_CLINIC_OR_DEPARTMENT_OTHER): Payer: Medicare Other | Admitting: Hematology

## 2018-11-29 ENCOUNTER — Other Ambulatory Visit: Payer: Self-pay

## 2018-11-29 ENCOUNTER — Encounter: Payer: Self-pay | Admitting: Hematology

## 2018-11-29 VITALS — BP 130/75 | HR 55 | Temp 98.3°F | Resp 18 | Ht 70.0 in | Wt 255.8 lb

## 2018-11-29 DIAGNOSIS — C01 Malignant neoplasm of base of tongue: Secondary | ICD-10-CM | POA: Diagnosis not present

## 2018-11-29 DIAGNOSIS — Z95828 Presence of other vascular implants and grafts: Secondary | ICD-10-CM

## 2018-11-29 DIAGNOSIS — K1231 Oral mucositis (ulcerative) due to antineoplastic therapy: Secondary | ICD-10-CM

## 2018-11-29 DIAGNOSIS — Z5111 Encounter for antineoplastic chemotherapy: Secondary | ICD-10-CM | POA: Diagnosis not present

## 2018-11-29 DIAGNOSIS — R110 Nausea: Secondary | ICD-10-CM | POA: Diagnosis not present

## 2018-11-29 DIAGNOSIS — K521 Toxic gastroenteritis and colitis: Secondary | ICD-10-CM | POA: Diagnosis not present

## 2018-11-29 DIAGNOSIS — G893 Neoplasm related pain (acute) (chronic): Secondary | ICD-10-CM | POA: Diagnosis not present

## 2018-11-29 DIAGNOSIS — T451X5A Adverse effect of antineoplastic and immunosuppressive drugs, initial encounter: Secondary | ICD-10-CM

## 2018-11-29 DIAGNOSIS — K5903 Drug induced constipation: Secondary | ICD-10-CM | POA: Diagnosis not present

## 2018-11-29 DIAGNOSIS — E46 Unspecified protein-calorie malnutrition: Secondary | ICD-10-CM

## 2018-11-29 DIAGNOSIS — Z510 Encounter for antineoplastic radiation therapy: Secondary | ICD-10-CM | POA: Diagnosis not present

## 2018-11-29 LAB — CBC WITH DIFFERENTIAL (CANCER CENTER ONLY)
Abs Immature Granulocytes: 0.15 10*3/uL — ABNORMAL HIGH (ref 0.00–0.07)
Basophils Absolute: 0 10*3/uL (ref 0.0–0.1)
Basophils Relative: 0 %
Eosinophils Absolute: 0.1 10*3/uL (ref 0.0–0.5)
Eosinophils Relative: 1 %
HCT: 41.9 % (ref 39.0–52.0)
Hemoglobin: 13.9 g/dL (ref 13.0–17.0)
Immature Granulocytes: 2 %
Lymphocytes Relative: 4 %
Lymphs Abs: 0.3 10*3/uL — ABNORMAL LOW (ref 0.7–4.0)
MCH: 29.9 pg (ref 26.0–34.0)
MCHC: 33.2 g/dL (ref 30.0–36.0)
MCV: 90.1 fL (ref 80.0–100.0)
Monocytes Absolute: 0.5 10*3/uL (ref 0.1–1.0)
Monocytes Relative: 7 %
Neutro Abs: 6.3 10*3/uL (ref 1.7–7.7)
Neutrophils Relative %: 86 %
Platelet Count: 163 10*3/uL (ref 150–400)
RBC: 4.65 MIL/uL (ref 4.22–5.81)
RDW: 13.9 % (ref 11.5–15.5)
WBC Count: 7.3 10*3/uL (ref 4.0–10.5)
nRBC: 0 % (ref 0.0–0.2)

## 2018-11-29 LAB — BASIC METABOLIC PANEL - CANCER CENTER ONLY
Anion gap: 7 (ref 5–15)
BUN: 18 mg/dL (ref 8–23)
CO2: 27 mmol/L (ref 22–32)
Calcium: 8.4 mg/dL — ABNORMAL LOW (ref 8.9–10.3)
Chloride: 106 mmol/L (ref 98–111)
Creatinine: 0.97 mg/dL (ref 0.61–1.24)
GFR, Est AFR Am: >60 mL/min (ref >60)
GFR, Estimated: >60 mL/min (ref >60)
Glucose, Bld: 106 mg/dL — ABNORMAL HIGH (ref 70–99)
Potassium: 4.2 mmol/L (ref 3.5–5.1)
Sodium: 140 mmol/L (ref 135–145)

## 2018-11-29 LAB — MAGNESIUM: Magnesium: 2 mg/dL (ref 1.7–2.4)

## 2018-11-29 MED ORDER — SODIUM CHLORIDE 0.9% FLUSH
10.0000 mL | INTRAVENOUS | Status: DC | PRN
  Administered 2018-11-29: 11:00:00 10 mL
  Filled 2018-11-29: qty 10

## 2018-11-29 NOTE — Progress Notes (Signed)
[place] OFFICE PROGRESS NOTE  Patient Care Team: Bartholome Bill, MD as PCP - General (Family Medicine) O'Neal, Cassie Freer, MD as PCP - Cardiology (Cardiology) Eppie Gibson, MD as Attending Physician (Radiation Oncology) Leota Sauers, RN as Oncology Nurse Navigator Schinke, [place] Mount, [place] Grant as Speech Language Pathologist (Speech Pathology) Karie Mainland, RD as Dietitian (Nutrition) Kennith Center, LCSW as Social Worker  HEME/ONC OVERVIEW: 1. 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 A-fib on Eliquis   2. 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, p16+ -S/p 2 cycle of weekly carboplatin concurrent with RT in the definitive setting  -Labs adequate today, proceed with Cycle 3 of weekly carboplatin -Plan for 7 cycles -PRN antiemetics, Zofran, Compazine, dexamethasone, Ativan  Chemotherapy-associated nausea  -Secondary to chemotherapy -Symptoms relatively well controlled  -Continue PRN-anti-emetics   Chemotherapy-associated mucositis  -Secondary to the SCCa of the tongue and LN involvement -Unable to tolerate IR liquid morphine -Pain currently well controlled with oxycodone 5mg  q6hrs PRN for pain  -Continue the regimen above   Chemotherapy-associated diarrhea -Secondary to chemotherapy as well as laxatives (for opioid-induced constipation) -Diarrhea resolved after stopping Senna; no abdominal pain, hematochezia or melena  -I  encouraged the patient to monitor his stool consistency closely, given hx of opioid-induced constipation, and resume bowel regimen as needed to maintain soft BM's   Protein malnutrition -Secondary to chemoRT -While his weight has remained relatively stable, his oral intake has decreased significantly due to poor taste and mucositis -Therefore, I recommend the patient to start tube feeding with 1-2 cans of Osmolite per day and discuss further with nutrition -I also encouraged the patient to continue oral intake as tolerated   Stage II-III CKD -Baseline Cr ~1.2 -Cr 0.95 today, improving; electrolytes normal -We will monitor it closely   No orders of the defined types were placed in this encounter.  All questions were answered. The patient knows to call the clinic with any problems, questions or concerns. No barriers to learning was detected.  Return in 1 week for labs, port flush, clinic appt and Cycle 4 of chemotherapy.  [name], MD 11/29/2018 11:54 AM  CHIEF COMPLAINT: "I am hanging in there"  INTERVAL HISTORY: Neil [name] returns to clinic for follow-up of squamous cell carcinoma of the base of the tongue on definitive chemoradiation.  Patient reports that over the weekend, he had several episodes of diarrhea, and he had to stop taking one of the laxatives (for opioid induced constipation).  Since stopping the laxatives, the diarrhea has resolved.  He noticed some blood on the toilet paper, which she attributes to hemorrhoids, but he has not had any abdominal pain, frank hematochezia or melena.  He is having mild to moderate pain with swallowing, for which he takes oxycodone once every 2 to 3 days with adequate pain control.  Due to poor taste and pain with swallowing, his oral intake has decreased significantly, but he has not started his tube feeding yet.  He  is able to drink approximately 72 ounce of fluids per day, as well as 2-3 Ensure/Boost daily.  He has occasional nausea, for which  Compazine helps.  He denies any vomiting.  He denies any other complaint today.  REVIEW OF SYSTEMS:   Constitutional: ( - ) fevers, ( - )  chills , ( - ) night sweats Eyes: ( - ) blurriness of vision, ( - ) double vision, ( - ) watery eyes Ears, nose, mouth, throat, and face: ( + ) mucositis, ( + ) sore throat Respiratory: ( - ) cough, ( - ) dyspnea, ( - ) wheezes Cardiovascular: ( - ) palpitation, ( - ) chest discomfort, ( + ) lower extremity swelling Gastrointestinal:  ( + ) nausea, ( - ) heartburn, ( - ) change in bowel habits Skin: ( - ) abnormal skin rashes Lymphatics: ( - ) new lymphadenopathy, ( - ) easy bruising Neurological: ( - ) numbness, ( - ) tingling, ( - ) new weaknesses Behavioral/Psych: ( - ) mood change, ( - ) new changes  All other systems were reviewed with the patient and are negative.  SUMMARY OF ONCOLOGIC HISTORY: Oncology History  Cancer of base of tongue ([place])  07/29/2018 Imaging   Neck ultrasound: IMPRESSION: 16 x 30 mm solid mass in the right submandibular area. This may represent a submandibular mass or enlarged lymph node. Recommend CT neck with contrast for further evaluation.   08/03/2018 Imaging   CT neck (at [facility]): IMPRESSION: 1. 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 There is a single ipsilateral malignant lymph node measuring 3 cm. 2. Nodular thickening along the anterior right sublingual gland, attention on follow-up PET.   09/04/2018 Procedure   US-guided bx of the R submandibular mass   09/04/2018 Pathology Results   Accession: [id number]  Lymph node, needle/core biopsy, right submandibular - SQUAMOUS CELL CARCINOMA. - SEE MICROSCOPIC DESCRIPTION.   09/11/2018 Imaging   PET: IMPRESSION: Hypermetabolism along the right base of tongue, corresponding to the patient's known primary oropharyngeal cancer.   Ipsilateral level 2 cervical nodal metastasis.    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   11/16/2018 -  Chemotherapy   The patient had palonosetron (ALOXI) injection 0.25 mg, 0.25 mg, Intravenous,  Once, 2 of 7 cycles Administration: 0.25 mg (11/16/2018), 0.25 mg (11/23/2018) CARBOplatin (PARAPLATIN) 200 mg in sodium chloride 0.9 % 250 mL chemo infusion, 200 mg (100 % of original dose 202.8 mg), Intravenous,  Once, 2 of 7 cycles Dose modification: 202.8 mg (original dose 202.8 mg, Cycle 1) Administration: 200 mg (11/16/2018), 200 mg (11/23/2018)  for chemotherapy treatment.      I have reviewed the past medical history, past surgical history, social history and family history with the patient and they are unchanged from previous note.  ALLERGIES:  is allergic to lisinopril.  MEDICATIONS:  Current Outpatient Medications  Medication Sig Dispense Refill  . docusate sodium (COLACE) 100 MG capsule Take 100 mg by mouth as directed. Take 100 mg in AM,  200 mg in PM.    . Apixaban (ELIQUIS PO) Take 5 mg by mouth 2 (two) times daily.     Marland Kitchen dexamethasone (DECADRON) 4 MG tablet Take 2 tablets (8 mg total) by mouth daily. Start the day after chemotherapy for 2 days. Take with food. (Patient not taking: Reported on 11/22/2018) 30 tablet 1  . hydrochlorothiazide (HYDRODIURIL) 25  MG tablet Take 1 tablet (25 mg total) by mouth daily. 90 tablet 3  . HYDROcodone-acetaminophen (NORCO/VICODIN) 5-325 MG tablet Take 1 tablet by mouth every 4 (four) hours as needed for moderate pain.    Marland Kitchen lidocaine (XYLOCAINE) 2 % solution Patient: Mix 1part 2% viscous lidocaine, 1part H20. Swish & swallow 72mL of diluted mixture, 83min before meals and at bedtime, up to QID 100 mL 5  . lidocaine-prilocaine (EMLA) cream Apply to affected area once 30 g 3  . LORazepam (ATIVAN) 0.5 MG tablet Take 1 tablet (0.5 mg total) by mouth every 6 (six) hours as needed (Nausea or vomiting). 30 tablet 0   . Magnesium Citrate 100 MG TABS Take 1 tablet by mouth daily.    . metoprolol succinate (TOPROL-XL) 100 MG 24 hr tablet Take 1 tablet (100 mg total) by mouth daily. Take with or immediately following a meal. 90 tablet 3  . omeprazole (PRILOSEC) 20 MG capsule Take 20 mg by mouth daily.    . ondansetron (ZOFRAN) 8 MG tablet Take 1 tablet (8 mg total) by mouth 2 (two) times daily as needed for refractory nausea / vomiting. Start on day 3 after chemotherapy. 30 tablet 1  . oxyCODONE (OXY IR/ROXICODONE) 5 MG immediate release tablet Take 1 tablet (5 mg total) by mouth every 6 (six) hours as needed for severe pain. 60 tablet 0  . prochlorperazine (COMPAZINE) 10 MG tablet Take 1 tablet (10 mg total) by mouth every 6 (six) hours as needed (Nausea or vomiting). 30 tablet 1  . tamsulosin (FLOMAX) 0.4 MG CAPS capsule Take 0.4 mg by mouth daily after supper.     No current facility-administered medications for this visit.     PHYSICAL EXAMINATION: ECOG PERFORMANCE STATUS: 1 - Symptomatic but completely ambulatory  Today's Vitals   11/29/18 1119 11/29/18 1123  BP: 130/75   Pulse: (!) 55   Resp: 18   Temp: 98.3 F (36.8 C)   TempSrc: Oral   SpO2: 100%   Weight: 255 lb 12.8 oz (116 kg)   Height: 5\' 10"  (1.778 m)   PainSc:  2    Body mass index is 36.7 kg/m.  Filed Weights   11/29/18 1119  Weight: 255 lb 12.8 oz (116 kg)    GENERAL: alert, no distress and comfortable SKIN: skin color, texture, turgor are normal, no rashes or significant lesions EYES: conjunctiva are pink and non-injected, sclera clear OROPHARYNX: no exudate, no erythema; lips, buccal mucosa, and tongue normal  NECK: supple, non-tender LYMPH:   ~1-1.5cm, mobile, right Level II cervical LN, improving LUNGS: clear to auscultation with normal breathing effort HEART: regular rate & rhythm and no murmurs and no lower extremity edema ABDOMEN: soft, non-tender, non-distended, normal bowel sounds Musculoskeletal: no cyanosis of  digits and no clubbing  PSYCH: alert & oriented x 3, fluent speech NEURO: no focal motor/sensory deficits  LABORATORY DATA:  I have reviewed the data as listed    Component Value Date/Time   NA 140 11/29/2018 1045   K 4.2 11/29/2018 1045   CL 106 11/29/2018 1045   CO2 27 11/29/2018 1045   GLUCOSE 106 (H) 11/29/2018 1045   BUN 18 11/29/2018 1045   CREATININE 0.97 11/29/2018 1045   CALCIUM 8.4 (L) 11/29/2018 1045   PROT 6.6 10/18/2018 0846   ALBUMIN 3.9 10/18/2018 0846   AST 21 10/18/2018 0846   ALT 19 10/18/2018 0846   ALKPHOS 39 10/18/2018 0846   BILITOT 0.6 10/18/2018 0846   GFRNONAA >60 11/29/2018  [place] 11/29/2018 1045    No results found for: SPEP, UPEP  Lab Results  Component Value Date   WBC 7.3 11/29/2018   NEUTROABS 6.3 11/29/2018   HGB 13.9 11/29/2018   HCT 41.9 11/29/2018   MCV 90.1 11/29/2018   PLT 163 11/29/2018      Chemistry      Component Value Date/Time   NA 140 11/29/2018 1045   K 4.2 11/29/2018 1045   CL 106 11/29/2018 1045   CO2 27 11/29/2018 1045   BUN 18 11/29/2018 1045   CREATININE 0.97 11/29/2018 1045      Component Value Date/Time   CALCIUM 8.4 (L) 11/29/2018 1045   ALKPHOS 39 10/18/2018 0846   AST 21 10/18/2018 0846   ALT 19 10/18/2018 0846   BILITOT 0.6 10/18/2018 0846       RADIOGRAPHIC STUDIES: I have personally reviewed the radiological images as listed below and agreed with the findings in the report. Ir Gastrostomy Tube Mod Sed  Result Date: 11/07/2018 INDICATION: 73 year old male with lingual squamous cell carcinoma. He presents for combination procedure with placement of a percutaneous gastrostomy tube and port catheter. EXAM: IMPLANTED PORT A CATH PLACEMENT WITH ULTRASOUND AND FLUOROSCOPIC GUIDANCE IR percutaneous gastrostomy tube placement MEDICATIONS: 2 g Ancef; The antibiotic was administered within an appropriate time interval prior to skin puncture. ANESTHESIA/SEDATION: Versed 6 mg IV; Fentanyl 200 mcg IV;  Moderate Sedation Time:  53 minutes The patient was continuously monitored during the procedure by the interventional radiology nurse under my direct supervision. FLUOROSCOPY TIME:  2 minutes, 6 seconds (Q000111Q mGy) COMPLICATIONS: None immediate. PROCEDURE: PORT PLACEMENT The right neck and chest was prepped with chlorhexidine, and draped in the usual sterile fashion using maximum barrier technique (cap and mask, sterile gown, sterile gloves, large sterile sheet, hand hygiene and cutaneous antiseptic). Local anesthesia was attained by infiltration with 1% lidocaine with epinephrine. Ultrasound demonstrated patency of the right internal jugular vein, and this was documented with an image. Under real-time ultrasound guidance, this vein was accessed with a 21 gauge micropuncture needle and image documentation was performed. A small dermatotomy was made at the access site with an 11 scalpel. A 0.018" wire was advanced into the SVC and the access needle exchanged for a 26F micropuncture vascular sheath. The 0.018" wire was then removed and a 0.035" wire advanced into the IVC. An appropriate location for the subcutaneous reservoir was selected below the clavicle and an incision was made through the skin and underlying soft tissues. The subcutaneous tissues were then dissected using a combination of blunt and sharp surgical technique and a pocket was formed. A single lumen power injectable portacatheter was then tunneled through the subcutaneous tissues from the pocket to the dermatotomy and the port reservoir placed within the subcutaneous pocket. The venous access site was then serially dilated and a peel away vascular sheath placed over the wire. The wire was removed and the port catheter advanced into position under fluoroscopic guidance. The catheter tip is positioned in the superior cavoatrial junction. This was documented with a spot image. The portacatheter was then tested and found to flush and aspirate well. The port  was flushed with saline followed by 100 units/mL heparinized saline. The pocket was then closed in two layers using first subdermal inverted interrupted absorbable sutures followed by a running subcuticular suture. The epidermis was then sealed with Dermabond. The dermatotomy at the venous access site was also closed with Dermabond. GASTROSTOMY TUBE PLACEMENT Maximal barrier sterile  technique utilized including caps, mask, sterile gowns, sterile gloves, large sterile drape, hand hygiene, and chlorhexadine skin prep. An angled catheter was advanced over a wire under fluoroscopic guidance through the nose, down the esophagus and into the body of the stomach. The stomach was then insufflated with several 100 ml of air. Fluoroscopy confirmed location of the gastric bubble, as well as inferior displacement of the barium stained colon. Under direct fluoroscopic guidance, a single T-tack was placed, and the anterior gastric wall drawn up against the anterior abdominal wall. Percutaneous access was then obtained into the mid gastric body with an 18 gauge sheath needle. Aspiration of air, and injection of contrast material under fluoroscopy confirmed needle placement. An Amplatz wire was advanced in the gastric body and the access needle exchanged for a 9-French vascular sheath. A snare device was advanced through the vascular sheath and an Amplatz wire advanced through the angled catheter. The Amplatz wire was successfully snared and this was pulled up through the esophagus and out the mouth. A 20-French Alinda Dooms MIC-PEG tube was then connected to the snare and pulled through the mouth, down the esophagus, into the stomach and out to the anterior abdominal wall. Hand injection of contrast material confirmed intragastric location. The T-tack retention suture was then cut. The pull through peg tube was then secured with the external bumper and capped. The patient will be observed for several hours with the newly placed  tube on low wall suction to evaluate for any post procedure complication. The patient tolerated the procedure well, there is no immediate complication. IMPRESSION: 1. Successful placement of a right IJ approach Power Port with ultrasound and fluoroscopic guidance. The catheter is ready for use. 2. Successful placement of a 20 French pull-through percutaneous gastrostomy tube. Signed, Criselda Peaches, MD, [facility] Radiology Electronically Signed   By: Jacqulynn Cadet M.D.   On: 11/07/2018 16:15   Ir Imaging Guided Port Insertion  Result Date: 11/07/2018 INDICATION: 73 year old male with lingual squamous cell carcinoma. He presents for combination procedure with placement of a percutaneous gastrostomy tube and port catheter. EXAM: IMPLANTED PORT A CATH PLACEMENT WITH ULTRASOUND AND FLUOROSCOPIC GUIDANCE IR percutaneous gastrostomy tube placement MEDICATIONS: 2 g Ancef; The antibiotic was administered within an appropriate time interval prior to skin puncture. ANESTHESIA/SEDATION: Versed 6 mg IV; Fentanyl 200 mcg IV; Moderate Sedation Time:  53 minutes The patient was continuously monitored during the procedure by the interventional radiology nurse under my direct supervision. FLUOROSCOPY TIME:  2 minutes, 6 seconds (Q000111Q mGy) COMPLICATIONS: None immediate. PROCEDURE: PORT PLACEMENT The right neck and chest was prepped with chlorhexidine, and draped in the usual sterile fashion using maximum barrier technique (cap and mask, sterile gown, sterile gloves, large sterile sheet, hand hygiene and cutaneous antiseptic). Local anesthesia was attained by infiltration with 1% lidocaine with epinephrine. Ultrasound demonstrated patency of the right internal jugular vein, and this was documented with an image. Under real-time ultrasound guidance, this vein was accessed with a 21 gauge micropuncture needle and image documentation was performed. A small dermatotomy was  made at the access site with an 11 scalpel. A 0.018" wire was advanced into the SVC and the access needle exchanged for a 12F micropuncture vascular sheath. The 0.018" wire was then removed and a 0.035" wire advanced into the IVC. An appropriate location for the subcutaneous reservoir was selected below the clavicle and an incision was made through the skin and underlying soft tissues. The subcutaneous tissues were  then dissected using a combination of blunt and sharp surgical technique and a pocket was formed. A single lumen power injectable portacatheter was then tunneled through the subcutaneous tissues from the pocket to the dermatotomy and the port reservoir placed within the subcutaneous pocket. The venous access site was then serially dilated and a peel away vascular sheath placed over the wire. The wire was removed and the port catheter advanced into position under fluoroscopic guidance. The catheter tip is positioned in the superior cavoatrial junction. This was documented with a spot image. The portacatheter was then tested and found to flush and aspirate well. The port was flushed with saline followed by 100 units/mL heparinized saline. The pocket was then closed in two layers using first subdermal inverted interrupted absorbable sutures followed by a running subcuticular suture. The epidermis was then sealed with Dermabond. The dermatotomy at the venous access site was also closed with Dermabond. GASTROSTOMY TUBE PLACEMENT Maximal barrier sterile technique utilized including caps, mask, sterile gowns, sterile gloves, large sterile drape, hand hygiene, and chlorhexadine skin prep. An angled catheter was advanced over a wire under fluoroscopic guidance through the nose, down the esophagus and into the body of the stomach. The stomach was then insufflated with several 100 ml of air. Fluoroscopy confirmed location of the gastric bubble, as well as inferior displacement of the barium stained colon. Under  direct fluoroscopic guidance, a single T-tack was placed, and the anterior gastric wall drawn up against the anterior abdominal wall. Percutaneous access was then obtained into the mid gastric body with an 18 gauge sheath needle. Aspiration of air, and injection of contrast material under fluoroscopy confirmed needle placement. An Amplatz wire was advanced in the gastric body and the access needle exchanged for a 9-French vascular sheath. A snare device was advanced through the vascular sheath and an Amplatz wire advanced through the angled catheter. The Amplatz wire was successfully snared and this was pulled up through the esophagus and out the mouth. A 20-French Alinda Dooms MIC-PEG tube was then connected to the snare and pulled through the mouth, down the esophagus, into the stomach and out to the anterior abdominal wall. Hand injection of contrast material confirmed intragastric location. The T-tack retention suture was then cut. The pull through peg tube was then secured with the external bumper and capped. The patient will be observed for several hours with the newly placed tube on low wall suction to evaluate for any post procedure complication. The patient tolerated the procedure well, there is no immediate complication. IMPRESSION: 1. Successful placement of a right IJ approach Power Port with ultrasound and fluoroscopic guidance. The catheter is ready for use. 2. Successful placement of a 20 French pull-through percutaneous gastrostomy tube. Signed, Criselda Peaches, MD, [place] Radiology Electronically Signed   By: Jacqulynn Cadet M.D.   On: 11/07/2018 16:15

## 2018-11-30 ENCOUNTER — Inpatient Hospital Stay: Payer: Medicare Other | Admitting: Nutrition

## 2018-11-30 ENCOUNTER — Other Ambulatory Visit: Payer: Self-pay

## 2018-11-30 ENCOUNTER — Ambulatory Visit
Admission: RE | Admit: 2018-11-30 | Discharge: 2018-11-30 | Disposition: A | Discharge status: Home / Self Care | Payer: Medicare Other | Source: Ambulatory Visit | Attending: Radiation Oncology | Admitting: Radiation Oncology

## 2018-11-30 ENCOUNTER — Inpatient Hospital Stay: Payer: Medicare Other

## 2018-11-30 VITALS — BP 152/88 | HR 62 | Temp 98.7°F | Resp 20

## 2018-11-30 DIAGNOSIS — Z510 Encounter for antineoplastic radiation therapy: Secondary | ICD-10-CM | POA: Diagnosis not present

## 2018-11-30 DIAGNOSIS — K5903 Drug induced constipation: Secondary | ICD-10-CM | POA: Diagnosis not present

## 2018-11-30 DIAGNOSIS — C01 Malignant neoplasm of base of tongue: Secondary | ICD-10-CM

## 2018-11-30 DIAGNOSIS — G893 Neoplasm related pain (acute) (chronic): Secondary | ICD-10-CM | POA: Diagnosis not present

## 2018-11-30 DIAGNOSIS — E46 Unspecified protein-calorie malnutrition: Secondary | ICD-10-CM | POA: Diagnosis not present

## 2018-11-30 DIAGNOSIS — R110 Nausea: Secondary | ICD-10-CM | POA: Diagnosis not present

## 2018-11-30 DIAGNOSIS — Z5111 Encounter for antineoplastic chemotherapy: Secondary | ICD-10-CM | POA: Diagnosis not present

## 2018-11-30 MED ORDER — OSMOLITE 1.5 CAL PO LIQD: ORAL | 6 refills | Status: DC

## 2018-11-30 MED ORDER — DEXAMETHASONE SODIUM PHOSPHATE 10 MG/ML IJ SOLN
10.0000 mg | Freq: Once | INTRAMUSCULAR | Status: AC
  Administered 2018-11-30: 10 mg via INTRAVENOUS

## 2018-11-30 MED ORDER — SODIUM CHLORIDE 0.9 % IV SOLN
Freq: Once | INTRAVENOUS | Status: AC
  Administered 2018-11-30: 09:00:00 via INTRAVENOUS
  Filled 2018-11-30: qty 250

## 2018-11-30 MED ORDER — PALONOSETRON HCL INJECTION 0.25 MG/5ML
INTRAVENOUS | Status: AC
  Filled 2018-11-30: qty 5

## 2018-11-30 MED ORDER — DEXAMETHASONE SODIUM PHOSPHATE 10 MG/ML IJ SOLN
INTRAMUSCULAR | Status: AC
  Filled 2018-11-30: qty 1

## 2018-11-30 MED ORDER — HEPARIN SOD (PORK) LOCK FLUSH 100 UNIT/ML IV SOLN
500.0000 [IU] | Freq: Once | INTRAVENOUS | Status: AC | PRN
  Administered 2018-11-30: 500 [IU]
  Filled 2018-11-30: qty 5

## 2018-11-30 MED ORDER — PALONOSETRON HCL INJECTION 0.25 MG/5ML
0.2500 mg | Freq: Once | INTRAVENOUS | Status: AC
  Administered 2018-11-30: 0.25 mg via INTRAVENOUS

## 2018-11-30 MED ORDER — SODIUM CHLORIDE 0.9% FLUSH
10.0000 mL | INTRAVENOUS | Status: DC | PRN
  Administered 2018-11-30: 10 mL
  Filled 2018-11-30: qty 10

## 2018-11-30 MED ORDER — SODIUM CHLORIDE 0.9 % IV SOLN
260.0000 mg | Freq: Once | INTRAVENOUS | Status: AC
  Administered 2018-11-30: 260 mg via INTRAVENOUS
  Filled 2018-11-30: qty 26

## 2018-11-30 NOTE — Progress Notes (Signed)
Nutrition follow-up completed with patient over the phone.  He is receiving treatment for tongue cancer. Weight was documented as 255.8 pounds on September 23. Patient reports it is getting more and more difficult to eat enough food. He attributes this to decreased taste. He is continuing to try to eat softer, pured foods secondary to total dental extraction. Reports he drinks between 2 to 3 bottles of Ensure daily.  States he needs some supplies.  Estimated Nutrition Needs: 2440-2640 kcal, 130-150 gm pro, 2.6 L fluid.  Nutrition diagnosis: Inadequate oral intake continues.  Food and nutrition related knowledge deficit improved.  Intervention: Patient was educated to continue strategies for adequate calories and protein at mealtimes.  Recommended he try drinking Ensure or boost with meals. He will begin Osmolite 1.5 via PEG 3 times daily with 60 mL of free water before and after each bolus feeding via PEG. Patient will increase tube feeding to goal rate of 6 bottles Osmolite 1.5 and 4 ounces of Prosource daily. He will give 1-1/2 bottles of Osmolite 1.5, 4 times daily with 60 mL free water before and after.  In addition at each feeding he will give 1 ounce Prosource with free water flush after. He should continue to drink a minimum of 275 mL of free water 3 times daily. Patient able to repeat back strategies for adding Osmolite 1.5 to his regimen daily. Orders were written and home care agency notified.  6 bottles Osmolite 1.5 and 4 ounces Prosource will provide 2530 cal and 129 g protein.  Total free water is approximately 2751 mL free water.  Monitoring, evaluation, goals: Patient will tolerate calories and protein to minimize weight loss.  Next visit: Thursday, October 1 over the telephone.  **Disclaimer: This note was dictated with voice recognition software. Similar sounding words can inadvertently be transcribed and this note may contain transcription errors which may not have been  corrected upon publication of note.**

## 2018-11-30 NOTE — Patient Instructions (Signed)
[facility] Discharge Instructions for Patients Receiving Chemotherapy  Today you received the following chemotherapy agents Carboplatin  To help prevent nausea and vomiting after your treatment, we encourage you to take your nausea medication as directed   If you develop nausea and vomiting that is not controlled by your nausea medication, call the clinic.   BELOW ARE SYMPTOMS THAT SHOULD BE REPORTED IMMEDIATELY:  *FEVER GREATER THAN 100.5 F  *CHILLS WITH OR WITHOUT FEVER  NAUSEA AND VOMITING THAT IS NOT CONTROLLED WITH YOUR NAUSEA MEDICATION  *UNUSUAL SHORTNESS OF BREATH  *UNUSUAL BRUISING OR BLEEDING  TENDERNESS IN MOUTH AND THROAT WITH OR WITHOUT PRESENCE OF ULCERS  *URINARY PROBLEMS  *BOWEL PROBLEMS  UNUSUAL RASH Items with * indicate a potential emergency and should be followed up as soon as possible.  Feel free to call the clinic should you have any questions or concerns. The clinic phone number is [phone].  Please show the CHEMO ALERT CARD at check-in to the Emergency Department and triage nurse.   

## 2018-11-30 NOTE — Progress Notes (Signed)
Increase carbo to 260mg  per Dr Maylon Peppers

## 2018-12-01 ENCOUNTER — Ambulatory Visit
Admission: RE | Admit: 2018-12-01 | Discharge: 2018-12-01 | Disposition: A | Discharge status: Home / Self Care | Payer: Medicare Other | Source: Ambulatory Visit | Attending: Radiation Oncology | Admitting: Radiation Oncology

## 2018-12-01 ENCOUNTER — Other Ambulatory Visit: Payer: Self-pay

## 2018-12-01 DIAGNOSIS — Z510 Encounter for antineoplastic radiation therapy: Secondary | ICD-10-CM | POA: Diagnosis not present

## 2018-12-01 DIAGNOSIS — C01 Malignant neoplasm of base of tongue: Secondary | ICD-10-CM

## 2018-12-01 MED ORDER — SONAFINE EX EMUL
1.0000 "application " | Freq: Once | CUTANEOUS | Status: AC
  Administered 2018-12-01: 1 via TOPICAL

## 2018-12-04 ENCOUNTER — Ambulatory Visit
Admission: RE | Admit: 2018-12-04 | Discharge: 2018-12-04 | Disposition: A | Discharge status: Home / Self Care | Payer: Medicare Other | Source: Ambulatory Visit | Attending: Radiation Oncology | Admitting: Radiation Oncology

## 2018-12-04 ENCOUNTER — Other Ambulatory Visit: Payer: Self-pay

## 2018-12-04 DIAGNOSIS — Z510 Encounter for antineoplastic radiation therapy: Secondary | ICD-10-CM | POA: Diagnosis not present

## 2018-12-04 DIAGNOSIS — C01 Malignant neoplasm of base of tongue: Secondary | ICD-10-CM | POA: Diagnosis not present

## 2018-12-05 ENCOUNTER — Ambulatory Visit
Admission: RE | Admit: 2018-12-05 | Discharge: 2018-12-05 | Disposition: A | Discharge status: Home / Self Care | Payer: Medicare Other | Source: Ambulatory Visit | Attending: Radiation Oncology | Admitting: Radiation Oncology

## 2018-12-05 ENCOUNTER — Other Ambulatory Visit: Payer: Self-pay

## 2018-12-05 DIAGNOSIS — Z510 Encounter for antineoplastic radiation therapy: Secondary | ICD-10-CM | POA: Diagnosis not present

## 2018-12-05 DIAGNOSIS — C01 Malignant neoplasm of base of tongue: Secondary | ICD-10-CM | POA: Diagnosis not present

## 2018-12-06 ENCOUNTER — Inpatient Hospital Stay: Payer: Medicare Other

## 2018-12-06 ENCOUNTER — Encounter: Payer: Self-pay | Admitting: Hematology

## 2018-12-06 ENCOUNTER — Other Ambulatory Visit: Payer: Self-pay

## 2018-12-06 ENCOUNTER — Encounter: Payer: Self-pay | Admitting: *Deleted

## 2018-12-06 ENCOUNTER — Inpatient Hospital Stay (HOSPITAL_BASED_OUTPATIENT_CLINIC_OR_DEPARTMENT_OTHER): Payer: Medicare Other | Admitting: Hematology

## 2018-12-06 ENCOUNTER — Ambulatory Visit
Admission: RE | Admit: 2018-12-06 | Discharge: 2018-12-06 | Disposition: A | Discharge status: Home / Self Care | Payer: Medicare Other | Source: Ambulatory Visit | Attending: Radiation Oncology | Admitting: Radiation Oncology

## 2018-12-06 VITALS — BP 135/74 | HR 73 | Temp 98.0°F | Resp 17 | Ht 70.0 in | Wt 250.7 lb

## 2018-12-06 DIAGNOSIS — E46 Unspecified protein-calorie malnutrition: Secondary | ICD-10-CM

## 2018-12-06 DIAGNOSIS — K1231 Oral mucositis (ulcerative) due to antineoplastic therapy: Secondary | ICD-10-CM | POA: Diagnosis not present

## 2018-12-06 DIAGNOSIS — Z510 Encounter for antineoplastic radiation therapy: Secondary | ICD-10-CM | POA: Diagnosis not present

## 2018-12-06 DIAGNOSIS — K521 Toxic gastroenteritis and colitis: Secondary | ICD-10-CM | POA: Diagnosis not present

## 2018-12-06 DIAGNOSIS — C01 Malignant neoplasm of base of tongue: Secondary | ICD-10-CM | POA: Diagnosis not present

## 2018-12-06 DIAGNOSIS — K5903 Drug induced constipation: Secondary | ICD-10-CM | POA: Diagnosis not present

## 2018-12-06 DIAGNOSIS — R110 Nausea: Secondary | ICD-10-CM

## 2018-12-06 DIAGNOSIS — Z5111 Encounter for antineoplastic chemotherapy: Secondary | ICD-10-CM | POA: Diagnosis not present

## 2018-12-06 DIAGNOSIS — T451X5A Adverse effect of antineoplastic and immunosuppressive drugs, initial encounter: Secondary | ICD-10-CM

## 2018-12-06 DIAGNOSIS — D6959 Other secondary thrombocytopenia: Secondary | ICD-10-CM

## 2018-12-06 DIAGNOSIS — Z95828 Presence of other vascular implants and grafts: Secondary | ICD-10-CM

## 2018-12-06 DIAGNOSIS — G893 Neoplasm related pain (acute) (chronic): Secondary | ICD-10-CM | POA: Diagnosis not present

## 2018-12-06 LAB — CBC WITH DIFFERENTIAL (CANCER CENTER ONLY)
Abs Immature Granulocytes: 0.09 10*3/uL — ABNORMAL HIGH (ref 0.00–0.07)
Basophils Absolute: 0 10*3/uL (ref 0.0–0.1)
Basophils Relative: 0 %
Eosinophils Absolute: 0.1 10*3/uL (ref 0.0–0.5)
Eosinophils Relative: 1 %
HCT: 41.3 % (ref 39.0–52.0)
Hemoglobin: 13.8 g/dL (ref 13.0–17.0)
Immature Granulocytes: 2 %
Lymphocytes Relative: 5 %
Lymphs Abs: 0.3 10*3/uL — ABNORMAL LOW (ref 0.7–4.0)
MCH: 30.3 pg (ref 26.0–34.0)
MCHC: 33.4 g/dL (ref 30.0–36.0)
MCV: 90.8 fL (ref 80.0–100.0)
Monocytes Absolute: 0.4 10*3/uL (ref 0.1–1.0)
Monocytes Relative: 8 %
Neutro Abs: 3.9 10*3/uL (ref 1.7–7.7)
Neutrophils Relative %: 84 %
Platelet Count: 127 10*3/uL — ABNORMAL LOW (ref 150–400)
RBC: 4.55 MIL/uL (ref 4.22–5.81)
RDW: 14.5 % (ref 11.5–15.5)
WBC Count: 4.7 10*3/uL (ref 4.0–10.5)
nRBC: 0 % (ref 0.0–0.2)

## 2018-12-06 LAB — BASIC METABOLIC PANEL - CANCER CENTER ONLY
Anion gap: 8 (ref 5–15)
BUN: 18 mg/dL (ref 8–23)
CO2: 25 mmol/L (ref 22–32)
Calcium: 8.4 mg/dL — ABNORMAL LOW (ref 8.9–10.3)
Chloride: 106 mmol/L (ref 98–111)
Creatinine: 1 mg/dL (ref 0.61–1.24)
GFR, Est AFR Am: >60 mL/min (ref >60)
GFR, Estimated: >60 mL/min (ref >60)
Glucose, Bld: 101 mg/dL — ABNORMAL HIGH (ref 70–99)
Potassium: 4.2 mmol/L (ref 3.5–5.1)
Sodium: 139 mmol/L (ref 135–145)

## 2018-12-06 LAB — MAGNESIUM: Magnesium: 2 mg/dL (ref 1.7–2.4)

## 2018-12-06 MED ORDER — HEPARIN SOD (PORK) LOCK FLUSH 100 UNIT/ML IV SOLN
500.0000 [IU] | Freq: Once | INTRAVENOUS | Status: AC | PRN
  Administered 2018-12-06: 500 [IU]
  Filled 2018-12-06: qty 5

## 2018-12-06 MED ORDER — SODIUM CHLORIDE 0.9% FLUSH
10.0000 mL | INTRAVENOUS | Status: DC | PRN
  Administered 2018-12-06: 10:00:00 10 mL
  Filled 2018-12-06: qty 10

## 2018-12-06 NOTE — Progress Notes (Signed)
[name] OFFICE PROGRESS NOTE  Patient Care Team: Bartholome Bill, MD as PCP - General (Family Medicine) O'Neal, Cassie Freer, MD as PCP - Cardiology (Cardiology) Eppie Gibson, MD as Attending Physician (Radiation Oncology) Leota Sauers, RN as Oncology Nurse Navigator Schinke, Perry Mount, [place] as Speech Language Pathologist (Speech Pathology) Karie Mainland, RD as Dietitian (Nutrition) Kennith Center, LCSW as Social Worker  HEME/ONC OVERVIEW: 1. 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 A-fib on Eliquis   2. 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, p16+ -S/p 3 cycle of weekly carboplatin concurrent with RT in the definitive setting  -Labs adequate today, proceed with Cycle 4 of weekly carboplatin -Plan for 7 cycles -PRN antiemetics, Zofran, Compazine, dexamethasone, Ativan  Chemotherapy-associated thrombocytopenia -Secondary to chemotherapy -Plts 127k, lower than last visit  -Patient denies any symptoms of bleeding or excess bruising, such as epistaxis, hematochezia, melena, or hematuria -We will monitor for now; no indication for dose adjustment  Chemotherapy-associated nausea  -Secondary to chemotherapy -Symptoms relatively well controlled  -Continue PRN-anti-emetics   Chemotherapy-associated mucositis  -Secondary to the SCCa of the tongue and LN involvement -Unable to tolerate IR liquid morphine -Pain currently well  controlled with oxycodone 5mg  q6hrs PRN for pain  -Continue the regimen above   Chemotherapy-associated diarrhea -Secondary to chemotherapy as well as laxatives (for opioid-induced constipation) -His bowel consistency fluctuates between constipation and diarrhea, depending on the amount of laxatives he takes; also taking oxycodone, which periodically exacerbates constipation  -Continue to titrate bowel regimen as tolerated to achieve soft BM's   Protein malnutrition -Secondary to chemoRT -Weight down by 5 lbs since the last visit -I recommended the patient to increase his Osmolite to 4 bottles per day, and continue oral nutritional supplements as tolerated -Continue follow-up with nutrition  Stage II-III CKD -Baseline 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  All questions were answered. The patient knows to call the clinic with any problems, questions or concerns. No barriers to learning was detected.  Return in 1 week for labs, port flush, clinic appt and Cycle 5 of weekly carboplatin.   [name], MD 12/06/2018 11:20 AM  CHIEF COMPLAINT: "I am doing okay"  INTERVAL HISTORY: Neil [name] returns to clinic for follow-up of SCCa fothe BOT on definitive chemoradiation.  Patient reports that he has had more pain with swallowing over the past weekend, for which he took oxycodone ~3x per day with adequate pain control.  On average, he has been taking oxycodone approximately once a day with reasonable pain control.  He has been trying to limit the frequency of pain medication due to his bowel consistency fluctuating between constipation and diarrhea.  He had one episode of brief headache, for which he took one dose of Tylenol with resolution of the pain.  He is currently taking 2 cans of Osmolite and 1-2 bottles of 16  oz water per day via the feeding tube, as well as 2 Boosts and an additional  1-2 bottles of water by mouth.  He denies any other complaint today.   REVIEW OF SYSTEMS:   Constitutional: ( - ) fevers, ( - )  chills , ( - ) night sweats Eyes: ( - ) blurriness of vision, ( - ) double vision, ( - ) watery eyes Ears, nose, mouth, throat, and face: ( + ) mucositis, ( - ) sore throat Respiratory: ( - ) cough, ( - ) dyspnea, ( - ) wheezes Cardiovascular: ( - ) palpitation, ( - ) chest discomfort, ( - ) lower extremity swelling Gastrointestinal:  ( + ) nausea, ( - ) heartburn, ( + ) change in bowel habits Skin: ( - ) abnormal skin rashes Lymphatics: ( - ) new lymphadenopathy, ( - ) easy bruising Neurological: ( - ) numbness, ( - ) tingling, ( - ) new weaknesses Behavioral/Psych: ( - ) mood change, ( - ) new changes  All other systems were reviewed with the patient and are negative.  SUMMARY OF ONCOLOGIC HISTORY: Oncology History  Cancer of base of tongue ([place])  07/29/2018 Imaging   Neck ultrasound: IMPRESSION: 16 x 30 mm solid mass in the right submandibular area. This may represent a submandibular mass or enlarged lymph node. Recommend CT neck with contrast for further evaluation.   08/03/2018 Imaging   CT neck (at  [facility] IMPRESSION: 1. 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 There is a single ipsilateral malignant lymph node measuring 3 cm. 2. Nodular thickening along the anterior right sublingual gland, attention on follow-up PET.   09/04/2018 Procedure   US-guided bx of the R submandibular mass   09/04/2018 Pathology Results   Accession: [id number]  Lymph node, needle/core biopsy, right submandibular - SQUAMOUS CELL CARCINOMA. - SEE MICROSCOPIC DESCRIPTION.   09/11/2018 Imaging   PET: IMPRESSION: Hypermetabolism along the right base of tongue, corresponding to the patient's known primary oropharyngeal cancer.   Ipsilateral level 2 cervical nodal metastasis.   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   11/16/2018 -  Chemotherapy   The patient had palonosetron (ALOXI) injection 0.25 mg, 0.25 mg, Intravenous,  Once, 3 of 7 cycles Administration: 0.25 mg (11/16/2018), 0.25 mg (11/23/2018), 0.25 mg (11/30/2018) CARBOplatin (PARAPLATIN) 200 mg in sodium chloride 0.9 % 250 mL chemo infusion, 200 mg (100 % of original dose 202.8 mg), Intravenous,  Once, 3 of 7 cycles Dose modification: 202.8 mg (original dose 202.8 mg, Cycle 1) Administration: 200 mg (11/16/2018), 200 mg (11/23/2018), 260 mg (11/30/2018)  for chemotherapy treatment.      I have reviewed the past medical history, past surgical history, social history and family history with the patient and they are unchanged from previous note.  ALLERGIES:  is allergic to lisinopril.  MEDICATIONS:  Current Outpatient Medications  Medication Sig Dispense Refill  . Apixaban (ELIQUIS PO) Take 5 mg by mouth 2 (two) times daily.     Marland Kitchen dexamethasone (DECADRON) 4 MG tablet Take 2 tablets (8 mg total) by mouth daily. Start the day after chemotherapy for 2 days. Take with food. 30 tablet 1  . docusate sodium (COLACE) 100 MG capsule Take 100 mg by mouth as directed. Take 100 mg in AM,  200 mg in PM.    . hydrochlorothiazide (HYDRODIURIL) 25 MG tablet Take 1 tablet (25 mg total)  by mouth daily. 90 tablet 3  . HYDROcodone-acetaminophen (NORCO/VICODIN) 5-325 MG tablet Take 1 tablet by mouth every 4 (four) hours as needed for moderate pain.    Marland Kitchen lidocaine-prilocaine (EMLA) cream Apply to affected area once 30 g 3  . Magnesium Citrate 100 MG TABS Take 1 tablet by mouth daily.    . metoprolol succinate (TOPROL-XL) 100 MG 24 hr tablet Take 1 tablet (100 mg total) by mouth daily. Take with or immediately following a meal. 90 tablet 3  . Nutritional Supplements (FEEDING SUPPLEMENT, OSMOLITE 1.5 CAL,) LIQD 1 1/2 bottles Osmolite 1.5 via PEG with 60 mL  water before and after each bolus feeding QID. 1 OZ Prosource or equivalent via PEG QID with 60 mL water flush after. Provides 2530 kcal, 129 gm Pro: >90% estimated needs. Please send formula, Prosource and supplies. 1422 mL 6  . omeprazole (PRILOSEC) 20 MG capsule Take 20 mg by mouth daily.    Marland Kitchen oxyCODONE (OXY IR/ROXICODONE) 5 MG immediate release tablet Take 1 tablet (5 mg total) by mouth every 6 (six) hours as needed for severe pain. 60 tablet 0  . prochlorperazine (COMPAZINE) 10 MG tablet Take 1 tablet (10 mg total) by mouth every 6 (six) hours as needed (Nausea or vomiting). 30 tablet 1  . tamsulosin (FLOMAX) 0.4 MG CAPS capsule Take 0.4 mg by mouth daily after supper.    . lidocaine (XYLOCAINE) 2 % solution Patient: Mix 1part 2% viscous lidocaine, 1part H20. Swish & swallow 70mL of diluted mixture, 53min before meals and at bedtime, up to QID (Patient not taking: Reported on 12/06/2018) 100 mL 5  . LORazepam (ATIVAN) 0.5 MG tablet Take 1 tablet (0.5 mg total) by mouth every 6 (six) hours as needed (Nausea or vomiting). (Patient not taking: Reported on 12/06/2018) 30 tablet 0  . ondansetron (ZOFRAN) 8 MG tablet Take 1 tablet (8 mg total) by mouth 2 (two) times daily as needed for refractory nausea / vomiting. Start on day 3 after chemotherapy. (Patient not taking: Reported on 12/06/2018) 30 tablet 1   No current facility-administered medications for this visit.     PHYSICAL EXAMINATION: ECOG PERFORMANCE STATUS: 1 - Symptomatic but completely ambulatory  Today's Vitals   12/06/18 1036 12/06/18 1053  BP: 135/74   Pulse: 73   Resp: 17   Temp: 98 F (36.7 C)   TempSrc: Oral   SpO2: 100%   Weight: 250 lb 11.2 oz (113.7 kg)   Height: 5\' 10"  (1.778 m)   PainSc:  6    Body mass index is 35.97 kg/m.  Filed Weights   12/06/18 1036  Weight: 250 lb 11.2 oz (113.7 kg)    GENERAL: alert, no distress and comfortable SKIN: skin color, texture, turgor are normal, no rashes or significant  lesions EYES: conjunctiva are pink and non-injected, sclera clear OROPHARYNX: no exudate, no erythema; lips, buccal mucosa, and tongue normal  NECK: supple, non-tender LYMPH:   ~1-1.5cm, mobile, right Level II cervical LN, improving LUNGS: clear to auscultation with normal breathing effort HEART: regular rate & rhythm and no murmurs and no lower extremity edema ABDOMEN: soft, non-tender, non-distended, normal bowel sounds Musculoskeletal: no cyanosis of digits and no clubbing  PSYCH: alert & oriented x 3, fluent speech NEURO: no focal motor/sensory deficits  LABORATORY DATA:  I have reviewed the data as listed    Component Value Date/Time   NA 139 12/06/2018 1030   K 4.2 12/06/2018 1030   CL 106 12/06/2018 1030   CO2 25  12/06/2018 1030   GLUCOSE 101 (H) 12/06/2018 1030   BUN 18 12/06/2018 1030   CREATININE 1.00 12/06/2018 1030   CALCIUM 8.4 (L) 12/06/2018 1030   PROT 6.6 10/18/2018 0846   ALBUMIN 3.9 10/18/2018 0846   AST 21 10/18/2018 0846   ALT 19 10/18/2018 0846   ALKPHOS 39 10/18/2018 0846   BILITOT 0.6 10/18/2018 0846   GFRNONAA >60 12/06/2018 1030   GFRAA >60 12/06/2018 1030    No results found for: SPEP, UPEP  Lab Results  Component Value Date   WBC 4.7 12/06/2018   NEUTROABS 3.9 12/06/2018   HGB 13.8 12/06/2018   HCT 41.3 12/06/2018   MCV 90.8 12/06/2018   PLT 127 (L) 12/06/2018      Chemistry      Component Value Date/Time   NA 139 12/06/2018 1030   K 4.2 12/06/2018 1030   CL 106 12/06/2018 1030   CO2 25 12/06/2018 1030   BUN 18 12/06/2018 1030   CREATININE 1.00 12/06/2018 1030      Component Value Date/Time   CALCIUM 8.4 (L) 12/06/2018 1030   ALKPHOS 39 10/18/2018 0846   AST 21 10/18/2018 0846   ALT 19 10/18/2018 0846   BILITOT 0.6 10/18/2018 0846       RADIOGRAPHIC STUDIES: I have personally reviewed the radiological images as listed below and agreed with the findings in the report. Ir Gastrostomy Tube Mod Sed  Result Date:  11/07/2018 INDICATION: 73 year old male with lingual squamous cell carcinoma. He presents for combination procedure with placement of a percutaneous gastrostomy tube and port catheter. EXAM: IMPLANTED PORT A CATH PLACEMENT WITH ULTRASOUND AND FLUOROSCOPIC GUIDANCE IR percutaneous gastrostomy tube placement MEDICATIONS: 2 g Ancef; The antibiotic was administered within an appropriate time interval prior to skin puncture. ANESTHESIA/SEDATION: Versed 6 mg IV; Fentanyl 200 mcg IV; Moderate Sedation Time:  53 minutes The patient was continuously monitored during the procedure by the interventional radiology nurse under my direct supervision. FLUOROSCOPY TIME:  2 minutes, 6 seconds (Q000111Q mGy) COMPLICATIONS: None immediate. PROCEDURE: PORT PLACEMENT The right neck and chest was prepped with chlorhexidine, and draped in the usual sterile fashion using maximum barrier technique (cap and mask, sterile gown, sterile gloves, large sterile sheet, hand hygiene and cutaneous antiseptic). Local anesthesia was attained by infiltration with 1% lidocaine with epinephrine. Ultrasound demonstrated patency of the right internal jugular vein, and this was documented with an image. Under real-time ultrasound guidance, this vein was accessed with a 21 gauge micropuncture needle and image documentation was performed. A small dermatotomy was made at the access site with an 11 scalpel. A 0.018" wire was advanced into the SVC and the access needle exchanged for a 49F micropuncture vascular sheath. The 0.018" wire was then removed and a 0.035" wire advanced into the IVC. An appropriate location for the subcutaneous reservoir was selected below the clavicle and an incision was made through the skin and underlying soft tissues. The subcutaneous tissues were then dissected using a combination of blunt and sharp surgical technique and a pocket was formed. A single lumen power injectable portacatheter was then tunneled through the subcutaneous tissues  from the pocket to the dermatotomy and the port reservoir placed within the subcutaneous pocket. The venous access site was then serially dilated and a peel away vascular sheath placed over the wire. The wire was removed and the port catheter advanced into position under fluoroscopic guidance. The catheter tip is positioned in the superior cavoatrial junction. This was documented with a spot image.  The portacatheter was then tested and found to flush and aspirate well. The port was flushed with saline followed by 100 units/mL heparinized saline. The pocket was then closed in two layers using first subdermal inverted interrupted absorbable sutures followed by a running subcuticular suture. The epidermis was then sealed with Dermabond. The dermatotomy at the venous access site was also closed with Dermabond. GASTROSTOMY TUBE PLACEMENT Maximal barrier sterile technique utilized including caps, mask, sterile gowns, sterile gloves, large sterile drape, hand hygiene, and chlorhexadine skin prep. An angled catheter was advanced over a wire under fluoroscopic guidance through the nose, down the esophagus and into the body of the stomach. The stomach was then insufflated with several 100 ml of air. Fluoroscopy confirmed location of the gastric bubble, as well as inferior displacement of the barium stained colon. Under direct fluoroscopic guidance, a single T-tack was placed, and the anterior gastric wall drawn up against the anterior abdominal wall. Percutaneous access was then obtained into the mid gastric body with an 18 gauge sheath needle. Aspiration of air, and injection of contrast material under fluoroscopy confirmed needle placement. An Amplatz wire was advanced in the gastric body and the access needle exchanged for a 9-French vascular sheath. A snare device was advanced through the vascular sheath and an Amplatz wire advanced through the angled catheter. The Amplatz wire was successfully snared and this was pulled  up through the esophagus and out the mouth. A 20-French Alinda Dooms MIC-PEG tube was then connected to the snare and pulled through the mouth, down the esophagus, into the stomach and out to the anterior abdominal wall. Hand injection of contrast material confirmed intragastric location. The T-tack retention suture was then cut. The pull through peg tube was then secured with the external bumper and capped. The patient will be observed for several hours with the newly placed tube on low wall suction to evaluate for any post procedure complication. The patient tolerated the procedure well, there is no immediate complication. IMPRESSION: 1. Successful placement of a right IJ approach Power Port with ultrasound and fluoroscopic guidance. The catheter is ready for use. 2. Successful placement of a 20 French pull-through percutaneous gastrostomy tube. Signed, Criselda Peaches, MD, [facility] Radiology Electronically Signed   By: Jacqulynn Cadet M.D.   On: 11/07/2018 16:15   Ir Imaging Guided Port Insertion  Result Date: 11/07/2018 INDICATION: 73 year old male with lingual squamous cell carcinoma. He presents for combination procedure with placement of a percutaneous gastrostomy tube and port catheter. EXAM: IMPLANTED PORT A CATH PLACEMENT WITH ULTRASOUND AND FLUOROSCOPIC GUIDANCE IR percutaneous gastrostomy tube placement MEDICATIONS: 2 g Ancef; The antibiotic was administered within an appropriate time interval prior to skin puncture. ANESTHESIA/SEDATION: Versed 6 mg IV; Fentanyl 200 mcg IV; Moderate Sedation Time:  53 minutes The patient was continuously monitored during the procedure by the interventional radiology nurse under my direct supervision. FLUOROSCOPY TIME:  2 minutes, 6 seconds (Q000111Q mGy) COMPLICATIONS: None immediate. PROCEDURE: PORT PLACEMENT The right neck and chest was prepped with chlorhexidine, and draped in the usual sterile fashion  using maximum barrier technique (cap and mask, sterile gown, sterile gloves, large sterile sheet, hand hygiene and cutaneous antiseptic). Local anesthesia was attained by infiltration with 1% lidocaine with epinephrine. Ultrasound demonstrated patency of the right internal jugular vein, and this was documented with an image. Under real-time ultrasound guidance, this vein was accessed with a 21 gauge micropuncture needle and image documentation was performed. A small dermatotomy was  made at the access site with an 11 scalpel. A 0.018" wire was advanced into the SVC and the access needle exchanged for a 31F micropuncture vascular sheath. The 0.018" wire was then removed and a 0.035" wire advanced into the IVC. An appropriate location for the subcutaneous reservoir was selected below the clavicle and an incision was made through the skin and underlying soft tissues. The subcutaneous tissues were then dissected using a combination of blunt and sharp surgical technique and a pocket was formed. A single lumen power injectable portacatheter was then tunneled through the subcutaneous tissues from the pocket to the dermatotomy and the port reservoir placed within the subcutaneous pocket. The venous access site was then serially dilated and a peel away vascular sheath placed over the wire. The wire was removed and the port catheter advanced into position under fluoroscopic guidance. The catheter tip is positioned in the superior cavoatrial junction. This was documented with a spot image. The portacatheter was then tested and found to flush and aspirate well. The port was flushed with saline followed by 100 units/mL heparinized saline. The pocket was then closed in two layers using first subdermal inverted interrupted absorbable sutures followed by a running subcuticular suture. The epidermis was then sealed with Dermabond. The dermatotomy at the venous access site was also closed with Dermabond. GASTROSTOMY TUBE PLACEMENT  Maximal barrier sterile technique utilized including caps, mask, sterile gowns, sterile gloves, large sterile drape, hand hygiene, and chlorhexadine skin prep. An angled catheter was advanced over a wire under fluoroscopic guidance through the nose, down the esophagus and into the body of the stomach. The stomach was then insufflated with several 100 ml of air. Fluoroscopy confirmed location of the gastric bubble, as well as inferior displacement of the barium stained colon. Under direct fluoroscopic guidance, a single T-tack was placed, and the anterior gastric wall drawn up against the anterior abdominal wall. Percutaneous access was then obtained into the mid gastric body with an 18 gauge sheath needle. Aspiration of air, and injection of contrast material under fluoroscopy confirmed needle placement. An Amplatz wire was advanced in the gastric body and the access needle exchanged for a 9-French vascular sheath. A snare device was advanced through the vascular sheath and an Amplatz wire advanced through the angled catheter. The Amplatz wire was successfully snared and this was pulled up through the esophagus and out the mouth. A 20-French Alinda Dooms MIC-PEG tube was then connected to the snare and pulled through the mouth, down the esophagus, into the stomach and out to the anterior abdominal wall. Hand injection of contrast material confirmed intragastric location. The T-tack retention suture was then cut. The pull through peg tube was then secured with the external bumper and capped. The patient will be observed for several hours with the newly placed tube on low wall suction to evaluate for any post procedure complication. The patient tolerated the procedure well, there is no immediate complication. IMPRESSION: 1. Successful placement of a right IJ approach Power Port with ultrasound and fluoroscopic guidance. The catheter is ready for use. 2. Successful placement of a 20 French pull-through percutaneous  gastrostomy tube. Signed, Criselda Peaches, MD, [facility] Radiology Electronically Signed   By: Jacqulynn Cadet M.D.   On: 11/07/2018 16:15

## 2018-12-07 ENCOUNTER — Other Ambulatory Visit: Payer: Self-pay

## 2018-12-07 ENCOUNTER — Inpatient Hospital Stay: Payer: Medicare Other | Attending: Hematology

## 2018-12-07 ENCOUNTER — Inpatient Hospital Stay: Payer: Medicare Other | Admitting: Nutrition

## 2018-12-07 ENCOUNTER — Ambulatory Visit
Admission: RE | Admit: 2018-12-07 | Discharge: 2018-12-07 | Disposition: A | Discharge status: Home / Self Care | Payer: Medicare Other | Source: Ambulatory Visit | Attending: Radiation Oncology | Admitting: Radiation Oncology

## 2018-12-07 ENCOUNTER — Other Ambulatory Visit: Payer: Self-pay | Admitting: Hematology

## 2018-12-07 VITALS — BP 129/64 | HR 55 | Temp 98.5°F | Resp 18

## 2018-12-07 DIAGNOSIS — Z5111 Encounter for antineoplastic chemotherapy: Secondary | ICD-10-CM | POA: Insufficient documentation

## 2018-12-07 DIAGNOSIS — C01 Malignant neoplasm of base of tongue: Secondary | ICD-10-CM | POA: Diagnosis not present

## 2018-12-07 DIAGNOSIS — Z7901 Long term (current) use of anticoagulants: Secondary | ICD-10-CM | POA: Insufficient documentation

## 2018-12-07 DIAGNOSIS — E46 Unspecified protein-calorie malnutrition: Secondary | ICD-10-CM | POA: Insufficient documentation

## 2018-12-07 DIAGNOSIS — N183 Chronic kidney disease, stage 3 unspecified: Secondary | ICD-10-CM | POA: Diagnosis not present

## 2018-12-07 DIAGNOSIS — R110 Nausea: Secondary | ICD-10-CM | POA: Insufficient documentation

## 2018-12-07 DIAGNOSIS — D701 Agranulocytosis secondary to cancer chemotherapy: Secondary | ICD-10-CM | POA: Insufficient documentation

## 2018-12-07 DIAGNOSIS — D6959 Other secondary thrombocytopenia: Secondary | ICD-10-CM | POA: Diagnosis not present

## 2018-12-07 DIAGNOSIS — R197 Diarrhea, unspecified: Secondary | ICD-10-CM | POA: Diagnosis not present

## 2018-12-07 DIAGNOSIS — Z510 Encounter for antineoplastic radiation therapy: Secondary | ICD-10-CM | POA: Diagnosis not present

## 2018-12-07 DIAGNOSIS — Z452 Encounter for adjustment and management of vascular access device: Secondary | ICD-10-CM | POA: Insufficient documentation

## 2018-12-07 DIAGNOSIS — I4891 Unspecified atrial fibrillation: Secondary | ICD-10-CM | POA: Diagnosis not present

## 2018-12-07 DIAGNOSIS — K1231 Oral mucositis (ulcerative) due to antineoplastic therapy: Secondary | ICD-10-CM | POA: Diagnosis not present

## 2018-12-07 MED ORDER — PALONOSETRON HCL INJECTION 0.25 MG/5ML
INTRAVENOUS | Status: AC
  Filled 2018-12-07: qty 5

## 2018-12-07 MED ORDER — DEXAMETHASONE SODIUM PHOSPHATE 10 MG/ML IJ SOLN
INTRAMUSCULAR | Status: AC
  Filled 2018-12-07: qty 1

## 2018-12-07 MED ORDER — DEXAMETHASONE SODIUM PHOSPHATE 10 MG/ML IJ SOLN
10.0000 mg | Freq: Once | INTRAMUSCULAR | Status: AC
  Administered 2018-12-07: 10 mg via INTRAVENOUS

## 2018-12-07 MED ORDER — PALONOSETRON HCL INJECTION 0.25 MG/5ML
0.2500 mg | Freq: Once | INTRAVENOUS | Status: AC
  Administered 2018-12-07: 0.25 mg via INTRAVENOUS

## 2018-12-07 MED ORDER — SODIUM CHLORIDE 0.9 % IV SOLN
260.0000 mg | Freq: Once | INTRAVENOUS | Status: AC
  Administered 2018-12-07: 260 mg via INTRAVENOUS
  Filled 2018-12-07: qty 26

## 2018-12-07 MED ORDER — SODIUM CHLORIDE 0.9% FLUSH
10.0000 mL | INTRAVENOUS | Status: DC | PRN
  Administered 2018-12-07: 10 mL
  Filled 2018-12-07: qty 10

## 2018-12-07 MED ORDER — HEPARIN SOD (PORK) LOCK FLUSH 100 UNIT/ML IV SOLN
500.0000 [IU] | Freq: Once | INTRAVENOUS | Status: AC | PRN
  Administered 2018-12-07: 500 [IU]
  Filled 2018-12-07: qty 5

## 2018-12-07 MED ORDER — SODIUM CHLORIDE 0.9 % IV SOLN
Freq: Once | INTRAVENOUS | Status: AC
  Administered 2018-12-07: 09:00:00 via INTRAVENOUS
  Filled 2018-12-07: qty 250

## 2018-12-07 NOTE — Patient Instructions (Signed)
[facility] Center Discharge Instructions for Patients Receiving Chemotherapy  Today you received the following chemotherapy agents Carboplatin  To help prevent nausea and vomiting after your treatment, we encourage you to take your nausea medication as directed   If you develop nausea and vomiting that is not controlled by your nausea medication, call the clinic.   BELOW ARE SYMPTOMS THAT SHOULD BE REPORTED IMMEDIATELY:  *FEVER GREATER THAN 100.5 F  *CHILLS WITH OR WITHOUT FEVER  NAUSEA AND VOMITING THAT IS NOT CONTROLLED WITH YOUR NAUSEA MEDICATION  *UNUSUAL SHORTNESS OF BREATH  *UNUSUAL BRUISING OR BLEEDING  TENDERNESS IN MOUTH AND THROAT WITH OR WITHOUT PRESENCE OF ULCERS  *URINARY PROBLEMS  *BOWEL PROBLEMS  UNUSUAL RASH Items with * indicate a potential emergency and should be followed up as soon as possible.  Feel free to call the clinic should you have any questions or concerns. The clinic phone number is [phone].  Please show the CHEMO ALERT CARD at check-in to the Emergency Department and triage nurse.   

## 2018-12-07 NOTE — Progress Notes (Signed)
Nutrition follow-up completed with patient during treatment for tongue cancer. Weight decreased and documented as 250.7 pounds on September 30. Noted glucose 101. Patient reports he is having more difficulty eating and drinking. States he is using his feeding tube more. Complains of constipation. Patient states he received a delivery today with tube feeding and liquid protein.  Estimated nutrition needs:  2440-2640 cal, 130-150 g protein, 2.6 L fluid.  Nutrition diagnosis: Inadequate oral intake continues.  Food and nutrition related knowledge deficit improved.  Intervention: Educated to continue strategies for oral intake as tolerated. Recommended patient begin using 1 bottle Osmolite 1.5, 4 times daily with 60 mL free water before and after each bolus feeding.  He will increase to 1-1/2 bottles 4 times daily as tolerated. In addition, patient will give Prosource, 4 ounces daily between tube feedings. Wrote out patient tube feeding schedule along with free water flushes. Patient was educated and teach back method used.  6 bottles Osmolite 1.5 and 4 ounces Prosource will provide 2530 cal and 129 g protein.  Total free water approximately 2751 mL.  Monitoring, evaluation, goals: Patient will tolerate increased calories and protein to minimize weight loss.  Next visit: Thursday, October 8 during infusion or by phone.  **Disclaimer: This note was dictated with voice recognition software. Similar sounding words can inadvertently be transcribed and this note may contain transcription errors which may not have been corrected upon publication of note.**

## 2018-12-07 NOTE — Progress Notes (Signed)
Spoke w/ Dr. Burr Medico since Dr. Maylon Peppers is away on PAL - use carboplatin dose of 260mg  today as based on Calvert equation. He received this dose last week.   Demetrius Charity, PharmD, [place] [facility] Pharmacist Pharmacy Phone: [phone] 12/07/2018

## 2018-12-07 NOTE — Progress Notes (Signed)
To provide care continuity/ongoing support, met with [name] during Est Pt appt with Dr. Maylon Peppers.  He has completed 15 of 30 XRT, 3 of 7 Carboplatin infusions. He reported:  Fatigue, increasing throat pain, occasional constipation/diarrhea.  Instillation of 2 Osmolite and at least 1 bottle of water thru PEG daily.  Oral intake of 2-3 Boost, 1 bottle water daily. Arrived today with 5 lb weight loss since last week. He voiced understanding of Dr. Lorette Ang guidance to increase Osmolite to 4 cartons daily.   Navigator Interventions Provided guidance to increase frequency of feedings throughout day to meet 4 carton goal, strategy for water intake. To follow-up on his expressed interest in patient mentor.  Gayleen Orem, RN, BSN Head & Neck Oncology Nurse [place] at [place] [phone]

## 2018-12-08 ENCOUNTER — Other Ambulatory Visit: Payer: Self-pay

## 2018-12-08 ENCOUNTER — Ambulatory Visit
Admission: RE | Admit: 2018-12-08 | Discharge: 2018-12-08 | Disposition: A | Discharge status: Home / Self Care | Payer: Medicare Other | Source: Ambulatory Visit | Attending: Radiation Oncology | Admitting: Radiation Oncology

## 2018-12-08 DIAGNOSIS — C01 Malignant neoplasm of base of tongue: Secondary | ICD-10-CM | POA: Diagnosis not present

## 2018-12-08 DIAGNOSIS — Z510 Encounter for antineoplastic radiation therapy: Secondary | ICD-10-CM | POA: Diagnosis not present

## 2018-12-11 ENCOUNTER — Telehealth: Payer: Self-pay | Admitting: *Deleted

## 2018-12-11 ENCOUNTER — Other Ambulatory Visit: Payer: Self-pay

## 2018-12-11 ENCOUNTER — Ambulatory Visit
Admission: RE | Admit: 2018-12-11 | Discharge: 2018-12-11 | Disposition: A | Discharge status: Home / Self Care | Payer: Medicare Other | Source: Ambulatory Visit | Attending: Radiation Oncology | Admitting: Radiation Oncology

## 2018-12-11 DIAGNOSIS — Z510 Encounter for antineoplastic radiation therapy: Secondary | ICD-10-CM | POA: Diagnosis not present

## 2018-12-11 DIAGNOSIS — C01 Malignant neoplasm of base of tongue: Secondary | ICD-10-CM | POA: Diagnosis not present

## 2018-12-11 NOTE — Telephone Encounter (Signed)
Oncology Nurse Navigator Documentation  Spoke with Mr. Adger, provided him background and contact information for patient mentor, explained confidentiality of relationship, informed he can expect a call late this afternoon/early this evening.  He ;voiced appreciation.  Gayleen Orem, RN, BSN Head & Neck Oncology Nurse [place] at [place] [phone]

## 2018-12-12 ENCOUNTER — Ambulatory Visit
Admission: RE | Admit: 2018-12-12 | Discharge: 2018-12-12 | Disposition: A | Discharge status: Home / Self Care | Payer: Medicare Other | Source: Ambulatory Visit | Attending: Radiation Oncology | Admitting: Radiation Oncology

## 2018-12-12 ENCOUNTER — Other Ambulatory Visit: Payer: Self-pay

## 2018-12-12 DIAGNOSIS — C01 Malignant neoplasm of base of tongue: Secondary | ICD-10-CM | POA: Diagnosis not present

## 2018-12-12 DIAGNOSIS — Z510 Encounter for antineoplastic radiation therapy: Secondary | ICD-10-CM | POA: Diagnosis not present

## 2018-12-13 ENCOUNTER — Ambulatory Visit
Admission: RE | Admit: 2018-12-13 | Discharge: 2018-12-13 | Disposition: A | Discharge status: Home / Self Care | Payer: Medicare Other | Source: Ambulatory Visit | Attending: Radiation Oncology | Admitting: Radiation Oncology

## 2018-12-13 ENCOUNTER — Encounter: Payer: Self-pay | Admitting: Hematology

## 2018-12-13 ENCOUNTER — Inpatient Hospital Stay: Payer: Medicare Other

## 2018-12-13 ENCOUNTER — Inpatient Hospital Stay (HOSPITAL_BASED_OUTPATIENT_CLINIC_OR_DEPARTMENT_OTHER): Payer: Medicare Other | Admitting: Hematology

## 2018-12-13 ENCOUNTER — Other Ambulatory Visit: Payer: Self-pay

## 2018-12-13 VITALS — BP 121/61 | HR 55 | Temp 98.5°F | Resp 18 | Ht 70.0 in | Wt 247.3 lb

## 2018-12-13 DIAGNOSIS — N183 Chronic kidney disease, stage 3 unspecified: Secondary | ICD-10-CM

## 2018-12-13 DIAGNOSIS — C01 Malignant neoplasm of base of tongue: Secondary | ICD-10-CM | POA: Diagnosis not present

## 2018-12-13 DIAGNOSIS — D6959 Other secondary thrombocytopenia: Secondary | ICD-10-CM | POA: Diagnosis not present

## 2018-12-13 DIAGNOSIS — R110 Nausea: Secondary | ICD-10-CM | POA: Diagnosis not present

## 2018-12-13 DIAGNOSIS — T451X5A Adverse effect of antineoplastic and immunosuppressive drugs, initial encounter: Secondary | ICD-10-CM | POA: Diagnosis not present

## 2018-12-13 DIAGNOSIS — Z5111 Encounter for antineoplastic chemotherapy: Secondary | ICD-10-CM | POA: Diagnosis not present

## 2018-12-13 DIAGNOSIS — K1231 Oral mucositis (ulcerative) due to antineoplastic therapy: Secondary | ICD-10-CM | POA: Diagnosis not present

## 2018-12-13 DIAGNOSIS — E46 Unspecified protein-calorie malnutrition: Secondary | ICD-10-CM | POA: Diagnosis not present

## 2018-12-13 DIAGNOSIS — Z510 Encounter for antineoplastic radiation therapy: Secondary | ICD-10-CM | POA: Diagnosis not present

## 2018-12-13 DIAGNOSIS — I4891 Unspecified atrial fibrillation: Secondary | ICD-10-CM | POA: Diagnosis not present

## 2018-12-13 DIAGNOSIS — Z95828 Presence of other vascular implants and grafts: Secondary | ICD-10-CM

## 2018-12-13 LAB — CBC WITH DIFFERENTIAL (CANCER CENTER ONLY)
Abs Immature Granulocytes: 0.16 10*3/uL — ABNORMAL HIGH (ref 0.00–0.07)
Basophils Absolute: 0 10*3/uL (ref 0.0–0.1)
Basophils Relative: 1 %
Eosinophils Absolute: 0.1 10*3/uL (ref 0.0–0.5)
Eosinophils Relative: 1 %
HCT: 43.5 % (ref 39.0–52.0)
Hemoglobin: 14.7 g/dL (ref 13.0–17.0)
Immature Granulocytes: 4 %
Lymphocytes Relative: 7 %
Lymphs Abs: 0.3 10*3/uL — ABNORMAL LOW (ref 0.7–4.0)
MCH: 29.7 pg (ref 26.0–34.0)
MCHC: 33.8 g/dL (ref 30.0–36.0)
MCV: 87.9 fL (ref 80.0–100.0)
Monocytes Absolute: 0.4 10*3/uL (ref 0.1–1.0)
Monocytes Relative: 9 %
Neutro Abs: 3.3 10*3/uL (ref 1.7–7.7)
Neutrophils Relative %: 78 %
Platelet Count: 130 10*3/uL — ABNORMAL LOW (ref 150–400)
RBC: 4.95 MIL/uL (ref 4.22–5.81)
RDW: 14.6 % (ref 11.5–15.5)
WBC Count: 4.2 10*3/uL (ref 4.0–10.5)
nRBC: 0 % (ref 0.0–0.2)

## 2018-12-13 LAB — BASIC METABOLIC PANEL - CANCER CENTER ONLY
Anion gap: 8 (ref 5–15)
BUN: 23 mg/dL (ref 8–23)
CO2: 25 mmol/L (ref 22–32)
Calcium: 8.5 mg/dL — ABNORMAL LOW (ref 8.9–10.3)
Chloride: 104 mmol/L (ref 98–111)
Creatinine: 0.96 mg/dL (ref 0.61–1.24)
GFR, Est AFR Am: >60 mL/min (ref >60)
GFR, Estimated: >60 mL/min (ref >60)
Glucose, Bld: 91 mg/dL (ref 70–99)
Potassium: 4.3 mmol/L (ref 3.5–5.1)
Sodium: 137 mmol/L (ref 135–145)

## 2018-12-13 LAB — MAGNESIUM: Magnesium: 2.2 mg/dL (ref 1.7–2.4)

## 2018-12-13 MED ORDER — SODIUM CHLORIDE 0.9% FLUSH
10.0000 mL | INTRAVENOUS | Status: DC | PRN
  Administered 2018-12-13: 10 mL
  Filled 2018-12-13: qty 10

## 2018-12-13 MED ORDER — HEPARIN SOD (PORK) LOCK FLUSH 100 UNIT/ML IV SOLN
500.0000 [IU] | Freq: Once | INTRAVENOUS | Status: AC | PRN
  Administered 2018-12-13: 500 [IU]
  Filled 2018-12-13: qty 5

## 2018-12-13 MED ORDER — OXYCODONE HCL 5 MG PO TABS: 5.0000 mg | ORAL_TABLET | Freq: Four times a day (QID) | ORAL | 0 refills | Status: DC | PRN

## 2018-12-13 NOTE — Progress Notes (Signed)
[facility] OFFICE PROGRESS NOTE  Patient Care Team: Bartholome Bill, MD as PCP - General (Family Medicine) O'Neal, Cassie Freer, MD as PCP - Cardiology (Cardiology) Eppie Gibson, MD as Attending Physician (Radiation Oncology) Leota Sauers, RN as Oncology Nurse Navigator [place], [place], [place] as Speech Language Pathologist (Speech Pathology) Karie Mainland, RD as Dietitian (Nutrition) Kennith Center, LCSW as Social Worker  HEME/ONC OVERVIEW: 1. 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 A-fib on Eliquis   2. 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, p16+ -S/p 4 cycle of weekly carboplatin concurrent with RT in the definitive setting  -Labs adequate today, proceed with Cycle 5 of weekly carboplatin -Plan for 7 cycles -PRN antiemetics, Zofran, Compazine, dexamethasone, Ativan  Chemotherapy-associated thrombocytopenia -Secondary to chemotherapy -Plts 130k, lower than last visit  -Patient denies any symptoms of bleeding or excess bruising, such as epistaxis, hematochezia, melena, or hematuria -We will monitor for now; no indication for dose adjustment  Chemotherapy-associated nausea  -Secondary to chemotherapy -Symptoms relatively well controlled  -Continue PRN-anti-emetics   Chemotherapy-associated mucositis  -Secondary to the SCCa of the tongue and LN involvement -Unable to tolerate IR liquid morphine -Pain currently well  controlled with oxycodone 5mg  q6hrs PRN for pain  -Continue the regimen above; I have refilled the oxycodone prescription today   Protein malnutrition -Secondary to chemoRT -Weight down by 3lbs since the last visit -Currently taking 6 Osmolites, 3 Power Source, and several bottles of fluid  -I encouraged the patient to adhere to nutritional recommendations   Thick saliva -Secondary to radiation -Reasonably controlled with salt/soda rinses  -If thick saliva worsens in the future, we can consider adding guaifenesin  Stage II-III CKD -Baseline Cr ~1.2 -Cr 0.96 today, stable; electrolytes normal -We will monitor it closely   No orders of the defined types were placed in this encounter.  All questions were answered. The patient knows to call the clinic with any problems, questions or concerns. No barriers to learning was detected.  Return in 1 week for Cycle 6 of chemotherapy.   [name], MD 12/13/2018 1:59 PM  CHIEF COMPLAINT: "My throat is more sore"  INTERVAL HISTORY: Mr. Rollene Rotunda returns clinic for follow-up of squamous cell carcinoma of the right BOT on definitive chemoradiation.  Patient reports that since the last visit, he has stopped taking food by mouth, and transition his nutrition to enteral feeding exclusively.  He is currently taking 6 cans of Osmolite, 3 Power Source (each with 241mL fluid), and at least 3 bottles of 16oz water per day.  He also has had more pain in the throat, for which he is currently taking oxycodone 3 times a day with adequate pain control.  He has not had any significant constipation, which he attributes to "being balanced by tube feeding and stool softener."  He has lost approximately 3 pounds since last visit.  He also reports mild to moderate fatigue, which interestingly seems to get better at the end of the  day.  He has persistent thick saliva, for which he takes salt/soda rinses with some improvement.  He denies any other complaint today.  REVIEW OF  SYSTEMS:   Constitutional: ( - ) fevers, ( - )  chills , ( - ) night sweats Eyes: ( - ) blurriness of vision, ( - ) double vision, ( - ) watery eyes Ears, nose, mouth, throat, and face: ( + ) mucositis, ( + ) sore throat Respiratory: ( - ) cough, ( - ) dyspnea, ( - ) wheezes Cardiovascular: ( - ) palpitation, ( - ) chest discomfort, ( - ) lower extremity swelling Gastrointestinal:  ( - ) nausea, ( - ) heartburn, ( + ) change in bowel habits Skin: ( - ) abnormal skin rashes Lymphatics: ( - ) new lymphadenopathy, ( - ) easy bruising Neurological: ( - ) numbness, ( - ) tingling, ( - ) new weaknesses Behavioral/Psych: ( - ) mood change, ( - ) new changes  All other systems were reviewed with the patient and are negative.  SUMMARY OF ONCOLOGIC HISTORY: Oncology History  Cancer of base of tongue ([place])  07/29/2018 Imaging   Neck ultrasound: IMPRESSION: 16 x 30 mm solid mass in the right submandibular area. This may represent a submandibular mass or enlarged lymph node. Recommend CT neck with contrast for further evaluation.   08/03/2018 Imaging   CT neck (at [facility]): IMPRESSION: 1. 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 There is a single ipsilateral malignant lymph node measuring 3 cm. 2. Nodular thickening along the anterior right sublingual gland, attention on follow-up PET.   09/04/2018 Procedure   US-guided bx of the R submandibular mass   09/04/2018 Pathology Results   Accession: [id number]  Lymph node, needle/core biopsy, right submandibular - SQUAMOUS CELL CARCINOMA. - SEE MICROSCOPIC DESCRIPTION.   09/11/2018 Imaging   PET: IMPRESSION: Hypermetabolism along the right base of tongue, corresponding to the patient's known primary oropharyngeal cancer.   Ipsilateral level 2 cervical nodal metastasis.   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   11/16/2018 -  Chemotherapy   The patient had palonosetron (ALOXI) injection 0.25 mg, 0.25 mg, Intravenous,  Once, 4 of 7 cycles Administration: 0.25 mg (11/16/2018), 0.25 mg (11/23/2018), 0.25 mg (11/30/2018), 0.25 mg (12/07/2018) CARBOplatin (PARAPLATIN) 200 mg in sodium chloride 0.9 % 250 mL chemo infusion, 200 mg (100 % of original dose 202.8 mg), Intravenous,  Once, 4 of 7 cycles Dose modification: 202.8 mg (original dose 202.8 mg, Cycle 1) Administration: 200 mg (11/16/2018), 200 mg (11/23/2018), 260 mg (11/30/2018), 260 mg (12/07/2018)  for chemotherapy treatment.      I have reviewed the past medical history, past surgical history, social history and family history with the patient and they are unchanged from previous note.  ALLERGIES:  is allergic to lisinopril.  MEDICATIONS:  Current Outpatient Medications  Medication Sig Dispense Refill  . Apixaban (ELIQUIS PO) Take 5 mg by mouth 2 (two) times daily.     Marland Kitchen dexamethasone (DECADRON) 4 MG tablet Take 2 tablets (8 mg total) by mouth daily. Start the day after chemotherapy for 2 days. Take with food. 30 tablet 1  . docusate sodium (COLACE) 100 MG capsule Take 100 mg by mouth as directed. Take 100 mg in AM,  200 mg in PM.    . hydrochlorothiazide (HYDRODIURIL) 25 MG tablet Take 1 tablet (25 mg total) by  mouth daily. 90 tablet 3  . HYDROcodone-acetaminophen (NORCO/VICODIN) 5-325 MG tablet Take 1 tablet by mouth every 4 (four) hours as needed for moderate pain.    Marland Kitchen lidocaine (XYLOCAINE) 2 % solution Patient: Mix 1part 2% viscous lidocaine, 1part H20. Swish & swallow 49mL of diluted mixture, 32min before meals and at bedtime, up to QID (Patient not taking: Reported on 12/06/2018) 100 mL 5  . lidocaine-prilocaine (EMLA) cream Apply to affected area once 30 g 3  . LORazepam (ATIVAN) 0.5 MG tablet Take 1 tablet (0.5 mg total) by mouth every 6 (six) hours as needed (Nausea or vomiting). (Patient not taking: Reported  on 12/06/2018) 30 tablet 0  . Magnesium Citrate 100 MG TABS Take 1 tablet by mouth daily.    . metoprolol succinate (TOPROL-XL) 100 MG 24 hr tablet Take 1 tablet (100 mg total) by mouth daily. Take with or immediately following a meal. 90 tablet 3  . Nutritional Supplements (FEEDING SUPPLEMENT, OSMOLITE 1.5 CAL,) LIQD 1 1/2 bottles Osmolite 1.5 via PEG with 60 mL water before and after each bolus feeding QID. 1 OZ Prosource or equivalent via PEG QID with 60 mL water flush after. Provides 2530 kcal, 129 gm Pro: >90% estimated needs. Please send formula, Prosource and supplies. 1422 mL 6  . omeprazole (PRILOSEC) 20 MG capsule Take 20 mg by mouth daily.    . ondansetron (ZOFRAN) 8 MG tablet Take 1 tablet (8 mg total) by mouth 2 (two) times daily as needed for refractory nausea / vomiting. Start on day 3 after chemotherapy. (Patient not taking: Reported on 12/06/2018) 30 tablet 1  . oxyCODONE (OXY IR/ROXICODONE) 5 MG immediate release tablet Take 1 tablet (5 mg total) by mouth every 6 (six) hours as needed for severe pain. 60 tablet 0  . prochlorperazine (COMPAZINE) 10 MG tablet Take 1 tablet (10 mg total) by mouth every 6 (six) hours as needed (Nausea or vomiting). 30 tablet 1  . tamsulosin (FLOMAX) 0.4 MG CAPS capsule Take 0.4 mg by mouth daily after supper.     No current facility-administered medications for this visit.     PHYSICAL EXAMINATION: ECOG PERFORMANCE STATUS: 1 - Symptomatic but completely ambulatory  Today's Vitals   12/13/18 1343 12/13/18 1351  BP: 121/61   Pulse: (!) 55   Resp: 18   Temp: 98.5 F (36.9 C)   TempSrc: Temporal   SpO2: 100%   Weight: 247 lb 4.8 oz (112.2 kg)   Height: 5\' 10"  (1.778 m)   PainSc:  4    Body mass index is 35.48 kg/m.  Filed Weights   12/13/18 1343  Weight: 247 lb 4.8 oz (112.2 kg)    GENERAL: alert, no distress and comfortable SKIN: skin color, texture, turgor are normal, no rashes or significant lesions EYES: conjunctiva are pink and  non-injected, sclera clear OROPHARYNX: no exudate, no erythema; lips, buccal mucosa, and tongue normal  NECK: supple, non-tender LYMPH:  ~1.5cm R Level II cervical LN, mobile, mildly tender  LUNGS: clear to auscultation with normal breathing effort HEART: regular rate & rhythm and no murmurs and no lower extremity edema ABDOMEN: soft, non-tender, non-distended, normal bowel sounds Musculoskeletal: no cyanosis of digits and no clubbing  PSYCH: alert & oriented x 3, fluent speech NEURO: no focal motor/sensory deficits  LABORATORY DATA:  I have reviewed the data as listed    Component Value Date/Time   NA 137 12/13/2018 1306   K 4.3 12/13/2018 1306   CL 104 12/13/2018 1306   CO2  25 12/13/2018 1306   GLUCOSE 91 12/13/2018 1306   BUN 23 12/13/2018 1306   CREATININE 0.96 12/13/2018 1306   CALCIUM 8.5 (L) 12/13/2018 1306   PROT 6.6 10/18/2018 0846   ALBUMIN 3.9 10/18/2018 0846   AST 21 10/18/2018 0846   ALT 19 10/18/2018 0846   ALKPHOS 39 10/18/2018 0846   BILITOT 0.6 10/18/2018 0846   GFRNONAA >60 12/13/2018 1306   GFRAA >60 12/13/2018 1306    No results found for: SPEP, UPEP  Lab Results  Component Value Date   WBC 4.2 12/13/2018   NEUTROABS 3.3 12/13/2018   HGB 14.7 12/13/2018   HCT 43.5 12/13/2018   MCV 87.9 12/13/2018   PLT 130 (L) 12/13/2018      Chemistry      Component Value Date/Time   NA 137 12/13/2018 1306   K 4.3 12/13/2018 1306   CL 104 12/13/2018 1306   CO2 25 12/13/2018 1306   BUN 23 12/13/2018 1306   CREATININE 0.96 12/13/2018 1306      Component Value Date/Time   CALCIUM 8.5 (L) 12/13/2018 1306   ALKPHOS 39 10/18/2018 0846   AST 21 10/18/2018 0846   ALT 19 10/18/2018 0846   BILITOT 0.6 10/18/2018 0846       RADIOGRAPHIC STUDIES: I have personally reviewed the radiological images as listed below and agreed with the findings in the report. No results found.

## 2018-12-14 ENCOUNTER — Inpatient Hospital Stay: Payer: Medicare Other

## 2018-12-14 ENCOUNTER — Inpatient Hospital Stay: Payer: Medicare Other | Admitting: Nutrition

## 2018-12-14 ENCOUNTER — Other Ambulatory Visit: Payer: Self-pay

## 2018-12-14 ENCOUNTER — Ambulatory Visit
Admission: RE | Admit: 2018-12-14 | Discharge: 2018-12-14 | Disposition: A | Discharge status: Home / Self Care | Payer: Medicare Other | Source: Ambulatory Visit | Attending: Radiation Oncology | Admitting: Radiation Oncology

## 2018-12-14 VITALS — BP 123/69 | HR 54 | Temp 98.3°F | Resp 18

## 2018-12-14 DIAGNOSIS — R110 Nausea: Secondary | ICD-10-CM | POA: Diagnosis not present

## 2018-12-14 DIAGNOSIS — C01 Malignant neoplasm of base of tongue: Secondary | ICD-10-CM

## 2018-12-14 DIAGNOSIS — I4891 Unspecified atrial fibrillation: Secondary | ICD-10-CM | POA: Diagnosis not present

## 2018-12-14 DIAGNOSIS — N183 Chronic kidney disease, stage 3 unspecified: Secondary | ICD-10-CM | POA: Diagnosis not present

## 2018-12-14 DIAGNOSIS — D6959 Other secondary thrombocytopenia: Secondary | ICD-10-CM | POA: Diagnosis not present

## 2018-12-14 DIAGNOSIS — Z510 Encounter for antineoplastic radiation therapy: Secondary | ICD-10-CM | POA: Diagnosis not present

## 2018-12-14 DIAGNOSIS — Z5111 Encounter for antineoplastic chemotherapy: Secondary | ICD-10-CM | POA: Diagnosis not present

## 2018-12-14 MED ORDER — SONAFINE EX EMUL
1.0000 "application " | Freq: Once | CUTANEOUS | Status: AC
  Administered 2018-12-14: 1 via TOPICAL

## 2018-12-14 MED ORDER — DEXAMETHASONE SODIUM PHOSPHATE 10 MG/ML IJ SOLN
10.0000 mg | Freq: Once | INTRAMUSCULAR | Status: AC
  Administered 2018-12-14: 10 mg via INTRAVENOUS

## 2018-12-14 MED ORDER — SODIUM CHLORIDE 0.9 % IV SOLN
Freq: Once | INTRAVENOUS | Status: AC
  Administered 2018-12-14: 09:00:00 via INTRAVENOUS
  Filled 2018-12-14: qty 250

## 2018-12-14 MED ORDER — HEPARIN SOD (PORK) LOCK FLUSH 100 UNIT/ML IV SOLN
500.0000 [IU] | Freq: Once | INTRAVENOUS | Status: AC | PRN
  Administered 2018-12-14: 500 [IU]
  Filled 2018-12-14: qty 5

## 2018-12-14 MED ORDER — SODIUM CHLORIDE 0.9 % IV SOLN
262.2000 mg | Freq: Once | INTRAVENOUS | Status: AC
  Administered 2018-12-14: 260 mg via INTRAVENOUS
  Filled 2018-12-14: qty 26

## 2018-12-14 MED ORDER — PALONOSETRON HCL INJECTION 0.25 MG/5ML
0.2500 mg | Freq: Once | INTRAVENOUS | Status: AC
  Administered 2018-12-14: 0.25 mg via INTRAVENOUS

## 2018-12-14 MED ORDER — SODIUM CHLORIDE 0.9 % IV SOLN: 202.8000 mg | Freq: Once | INTRAVENOUS | Status: DC

## 2018-12-14 MED ORDER — DEXAMETHASONE SODIUM PHOSPHATE 10 MG/ML IJ SOLN
INTRAMUSCULAR | Status: AC
  Filled 2018-12-14: qty 1

## 2018-12-14 MED ORDER — SODIUM CHLORIDE 0.9% FLUSH
10.0000 mL | INTRAVENOUS | Status: DC | PRN
  Administered 2018-12-14: 10 mL
  Filled 2018-12-14: qty 10

## 2018-12-14 MED ORDER — PALONOSETRON HCL INJECTION 0.25 MG/5ML
INTRAVENOUS | Status: AC
  Filled 2018-12-14: qty 5

## 2018-12-14 NOTE — Progress Notes (Signed)
Nutrition follow up completed with patient during infusion for Tongue cancer. Weight documented as 247.3 pounds decreased from 250.7 pounds September 30. Patient reports his tube feeding is going well. He is tolerating 6 bottles Osmolite 1.5 daily along with recommended protein supplement and additional water flushes. He does report taste alterations and does not really want to eat food. He denies any constipation or diarrhea. No nausea or vomiting.  Estimated nutrition needs: 2440-2640 cal, 130-150 g protein, 2.6 L fluid.  Nutrition diagnosis: Inadequate oral intake continues. Food and nutrition related knowledge deficit resolved.  Intervention: Patient was educated to continue Osmolite 1.5, 1-1/2 bottles 4 times daily with 60 mL free water before and after bolus feedings. He will give 1 ounce Prosource 4 times daily daily between tube feedings. Educated patient on the importance of continuing swallowing exercises and trying to swallow liquids.  6 bottles Osmolite 1.5 and 4 ounces Prosource provides 2530 cal, 129 g protein, 2751 mL free water.  Monitoring, evaluation, goals: Patient will tolerate increased calories and protein to minimize weight loss.  Next visit: Thursday, October 15 during infusion.

## 2018-12-14 NOTE — Patient Instructions (Signed)
Coronavirus (COVID-19) Are you at risk?  Are you at risk for the Coronavirus (COVID-19)?  To be considered HIGH RISK for Coronavirus (COVID-19), you have to meet the following criteria:  . Traveled to [place], [place], [place], [place] or [place]; or in the [place] to [place], [place], [place], or [place]; and have fever, cough, and shortness of breath within the last 2 weeks of travel OR . Been in close contact with a person diagnosed with COVID-19 within the last 2 weeks and have fever, cough, and shortness of breath . IF YOU DO NOT MEET THESE CRITERIA, YOU ARE CONSIDERED LOW RISK FOR COVID-19.  What to do if you are HIGH RISK for COVID-19?  . If you are having a medical emergency, call 911. . Seek medical care right away. Before you go to a doctor's office, urgent care or emergency department, call ahead and tell them about your recent travel, contact with someone diagnosed with COVID-19, and your symptoms. You should receive instructions from your physician's office regarding next steps of care.  . When you arrive at healthcare provider, tell the healthcare staff immediately you have returned from visiting [place], [place], [place], [place] or [place]; or traveled in the [place] to [place], [place], [place], or [place]; in the last two weeks or you have been in close contact with a person diagnosed with COVID-19 in the last 2 weeks.   . Tell the health care staff about your symptoms: fever, cough and shortness of breath. . After you have been seen by a medical provider, you will be either: o Tested for (COVID-19) and discharged home on quarantine except to seek medical care if symptoms worsen, and asked to  - Stay home and avoid contact with others until you get your results (4-5 days)  - Avoid travel on public transportation if possible (such as bus, train, or airplane) or o Sent to the Emergency Department by EMS for evaluation, COVID-19 testing, and possible  admission depending on your condition and test results.  What to do if you are LOW RISK for COVID-19?  Reduce your risk of any infection by using the same precautions used for avoiding the common cold or flu:  . Wash your hands often with soap and warm water for at least 20 seconds.  If soap and water are not readily available, use an alcohol-based hand sanitizer with at least 60% alcohol.  . If coughing or sneezing, cover your mouth and nose by coughing or sneezing into the elbow areas of your shirt or coat, into a tissue or into your sleeve (not your hands). . Avoid shaking hands with others and consider head nods or verbal greetings only. . Avoid touching your eyes, nose, or mouth with unwashed hands.  . Avoid close contact with people who are sick. . Avoid places or events with large numbers of people in one location, like concerts or sporting events. . Carefully consider travel plans you have or are making. . If you are planning any travel outside or inside the [place], visit the CDC's Travelers' Health webpage for the latest health notices. . If you have some symptoms but not all symptoms, continue to monitor at home and seek medical attention if your symptoms worsen. . If you are having a medical emergency, call 911.   ADDITIONAL HEALTHCARE OPTIONS FOR PATIENTS  [facility] / e-Visit: [email or web address]         [facility]: [phone]  [facility]: [phone]                   [facility]: [phone]     [facility] Discharge Instructions for Patients Receiving Chemotherapy  Today you received the following chemotherapy agents: Carboplatin.   To help prevent nausea and vomiting after your treatment, we encourage you to take your nausea medication as directed.   If you develop nausea and vomiting that is not controlled by your nausea medication, call the clinic.    BELOW ARE SYMPTOMS THAT SHOULD BE REPORTED IMMEDIATELY:  *FEVER GREATER THAN 100.5 F  *CHILLS WITH OR WITHOUT FEVER  NAUSEA AND VOMITING THAT IS NOT CONTROLLED WITH YOUR NAUSEA MEDICATION  *UNUSUAL SHORTNESS OF BREATH  *UNUSUAL BRUISING OR BLEEDING  TENDERNESS IN MOUTH AND THROAT WITH OR WITHOUT PRESENCE OF ULCERS  *URINARY PROBLEMS  *BOWEL PROBLEMS  UNUSUAL RASH Items with * indicate a potential emergency and should be followed up as soon as possible.  Feel free to call the clinic should you have any questions or concerns. The clinic phone number is [phone].  Please show the CHEMO ALERT CARD at check-in to the Emergency Department and triage nurse.   

## 2018-12-15 ENCOUNTER — Ambulatory Visit
Admission: RE | Admit: 2018-12-15 | Discharge: 2018-12-15 | Disposition: A | Discharge status: Home / Self Care | Payer: Medicare Other | Source: Ambulatory Visit | Attending: Radiation Oncology | Admitting: Radiation Oncology

## 2018-12-15 ENCOUNTER — Other Ambulatory Visit: Payer: Self-pay

## 2018-12-15 DIAGNOSIS — Z510 Encounter for antineoplastic radiation therapy: Secondary | ICD-10-CM | POA: Diagnosis not present

## 2018-12-15 DIAGNOSIS — C01 Malignant neoplasm of base of tongue: Secondary | ICD-10-CM | POA: Diagnosis not present

## 2018-12-18 ENCOUNTER — Ambulatory Visit
Admission: RE | Admit: 2018-12-18 | Discharge: 2018-12-18 | Disposition: A | Discharge status: Home / Self Care | Payer: Medicare Other | Source: Ambulatory Visit | Attending: Radiation Oncology | Admitting: Radiation Oncology

## 2018-12-18 ENCOUNTER — Other Ambulatory Visit: Payer: Self-pay

## 2018-12-18 DIAGNOSIS — C01 Malignant neoplasm of base of tongue: Secondary | ICD-10-CM | POA: Diagnosis not present

## 2018-12-18 DIAGNOSIS — Z510 Encounter for antineoplastic radiation therapy: Secondary | ICD-10-CM | POA: Diagnosis not present

## 2018-12-19 ENCOUNTER — Ambulatory Visit
Admission: RE | Admit: 2018-12-19 | Discharge: 2018-12-19 | Disposition: A | Discharge status: Home / Self Care | Payer: Medicare Other | Source: Ambulatory Visit | Attending: Radiation Oncology | Admitting: Radiation Oncology

## 2018-12-19 ENCOUNTER — Other Ambulatory Visit: Payer: Self-pay

## 2018-12-19 DIAGNOSIS — C01 Malignant neoplasm of base of tongue: Secondary | ICD-10-CM | POA: Diagnosis not present

## 2018-12-19 DIAGNOSIS — Z510 Encounter for antineoplastic radiation therapy: Secondary | ICD-10-CM | POA: Diagnosis not present

## 2018-12-20 ENCOUNTER — Inpatient Hospital Stay: Payer: Medicare Other

## 2018-12-20 ENCOUNTER — Inpatient Hospital Stay (HOSPITAL_BASED_OUTPATIENT_CLINIC_OR_DEPARTMENT_OTHER): Payer: Medicare Other | Admitting: Hematology

## 2018-12-20 ENCOUNTER — Encounter: Payer: Self-pay | Admitting: Hematology

## 2018-12-20 ENCOUNTER — Other Ambulatory Visit: Payer: Self-pay

## 2018-12-20 ENCOUNTER — Telehealth: Payer: Self-pay

## 2018-12-20 ENCOUNTER — Ambulatory Visit
Admission: RE | Admit: 2018-12-20 | Discharge: 2018-12-20 | Disposition: A | Discharge status: Home / Self Care | Payer: Medicare Other | Source: Ambulatory Visit | Attending: Radiation Oncology | Admitting: Radiation Oncology

## 2018-12-20 VITALS — BP 130/73 | HR 64 | Temp 98.3°F | Resp 18 | Ht 70.0 in | Wt 239.2 lb

## 2018-12-20 DIAGNOSIS — K1231 Oral mucositis (ulcerative) due to antineoplastic therapy: Secondary | ICD-10-CM | POA: Diagnosis not present

## 2018-12-20 DIAGNOSIS — C01 Malignant neoplasm of base of tongue: Secondary | ICD-10-CM

## 2018-12-20 DIAGNOSIS — N183 Chronic kidney disease, stage 3 unspecified: Secondary | ICD-10-CM

## 2018-12-20 DIAGNOSIS — R110 Nausea: Secondary | ICD-10-CM | POA: Diagnosis not present

## 2018-12-20 DIAGNOSIS — E46 Unspecified protein-calorie malnutrition: Secondary | ICD-10-CM | POA: Diagnosis not present

## 2018-12-20 DIAGNOSIS — K521 Toxic gastroenteritis and colitis: Secondary | ICD-10-CM

## 2018-12-20 DIAGNOSIS — D701 Agranulocytosis secondary to cancer chemotherapy: Secondary | ICD-10-CM

## 2018-12-20 DIAGNOSIS — T451X5A Adverse effect of antineoplastic and immunosuppressive drugs, initial encounter: Secondary | ICD-10-CM | POA: Diagnosis not present

## 2018-12-20 DIAGNOSIS — I4891 Unspecified atrial fibrillation: Secondary | ICD-10-CM | POA: Diagnosis not present

## 2018-12-20 DIAGNOSIS — Z510 Encounter for antineoplastic radiation therapy: Secondary | ICD-10-CM | POA: Diagnosis not present

## 2018-12-20 DIAGNOSIS — Z95828 Presence of other vascular implants and grafts: Secondary | ICD-10-CM

## 2018-12-20 DIAGNOSIS — D6959 Other secondary thrombocytopenia: Secondary | ICD-10-CM | POA: Diagnosis not present

## 2018-12-20 DIAGNOSIS — Z5111 Encounter for antineoplastic chemotherapy: Secondary | ICD-10-CM | POA: Diagnosis not present

## 2018-12-20 LAB — CBC WITH DIFFERENTIAL (CANCER CENTER ONLY)
Abs Immature Granulocytes: 0.07 10*3/uL (ref 0.00–0.07)
Basophils Absolute: 0 10*3/uL (ref 0.0–0.1)
Basophils Relative: 1 %
Eosinophils Absolute: 0 10*3/uL (ref 0.0–0.5)
Eosinophils Relative: 1 %
HCT: 44.1 % (ref 39.0–52.0)
Hemoglobin: 15.1 g/dL (ref 13.0–17.0)
Immature Granulocytes: 2 %
Lymphocytes Relative: 6 %
Lymphs Abs: 0.2 10*3/uL — ABNORMAL LOW (ref 0.7–4.0)
MCH: 29.8 pg (ref 26.0–34.0)
MCHC: 34.2 g/dL (ref 30.0–36.0)
MCV: 87 fL (ref 80.0–100.0)
Monocytes Absolute: 0.3 10*3/uL (ref 0.1–1.0)
Monocytes Relative: 8 %
Neutro Abs: 3 10*3/uL (ref 1.7–7.7)
Neutrophils Relative %: 82 %
Platelet Count: 128 10*3/uL — ABNORMAL LOW (ref 150–400)
RBC: 5.07 MIL/uL (ref 4.22–5.81)
RDW: 15 % (ref 11.5–15.5)
WBC Count: 3.6 10*3/uL — ABNORMAL LOW (ref 4.0–10.5)
nRBC: 0 % (ref 0.0–0.2)

## 2018-12-20 LAB — BASIC METABOLIC PANEL - CANCER CENTER ONLY
Anion gap: 9 (ref 5–15)
BUN: 20 mg/dL (ref 8–23)
CO2: 26 mmol/L (ref 22–32)
Calcium: 8.6 mg/dL — ABNORMAL LOW (ref 8.9–10.3)
Chloride: 103 mmol/L (ref 98–111)
Creatinine: 1.05 mg/dL (ref 0.61–1.24)
GFR, Est AFR Am: >60 mL/min (ref >60)
GFR, Estimated: >60 mL/min (ref >60)
Glucose, Bld: 100 mg/dL — ABNORMAL HIGH (ref 70–99)
Potassium: 3.9 mmol/L (ref 3.5–5.1)
Sodium: 138 mmol/L (ref 135–145)

## 2018-12-20 LAB — MAGNESIUM: Magnesium: 2.2 mg/dL (ref 1.7–2.4)

## 2018-12-20 MED ORDER — HEPARIN SOD (PORK) LOCK FLUSH 100 UNIT/ML IV SOLN
500.0000 [IU] | Freq: Once | INTRAVENOUS | Status: AC | PRN
  Administered 2018-12-20: 500 [IU]
  Filled 2018-12-20: qty 5

## 2018-12-20 MED ORDER — SODIUM CHLORIDE 0.9% FLUSH
10.0000 mL | INTRAVENOUS | Status: DC | PRN
  Administered 2018-12-20: 10 mL
  Filled 2018-12-20: qty 10

## 2018-12-20 NOTE — Progress Notes (Signed)
Chemo for tomorrow cancelled per Dr. Maylon Peppers request. Port de-accessed. TCT Ernestene Kiel, dietician.  Left her a vm message to call pt tomorrow regarding supplemental feedings. Pt having diarrhea with ProStat. Pt stopped this on 12/17/18.  He is able to do 4-5 cans of osmolyte per day.  He cannot tolerate 6 cans a day-too much bloating and feels very uncomfortable.

## 2018-12-20 NOTE — Progress Notes (Signed)
[facility] OFFICE PROGRESS NOTE  Patient Care Team: Bartholome Bill, MD as PCP - General (Family Medicine) [name], Neil Freer, MD as PCP - Cardiology (Cardiology) Eppie Gibson, MD as Attending Physician (Radiation Oncology) Leota Sauers, RN as Oncology Nurse Navigator [name], Neil Mount, Neil [name] as Speech Language Pathologist (Speech Pathology) Neil [name], RD as Dietitian (Nutrition) [facility], LCSW as Social Worker  HEME/ONC OVERVIEW: 1. 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 A-fib on Eliquis   2. 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, p16+ -S/p 5 cycle of weekly carboplatin concurrent with RT in the definitive setting  -Due to the worsening diarrhea and progressive weight loss, likely secondary to chemotherapy, I have cancelled Cycle 6 of chemotherapy this week -We will re-assess next week if the patient can receive the last cycle of treatment  -PRN antiemetics, Zofran, Compazine, dexamethasone, Ativan  Chemotherapy-associated thrombocytopenia -Secondary to chemotherapy -Plts 128k, stable -We will monitor for now; no indication for dose adjustment  Chemotherapy-associated leukopenia -Secondary to chemotherapy -WBC 3.6k, slightly lower than last visit  -Patient denies any symptoms of infection -We will monitor for now  Chemotherapy-associated diarrhea -Secondary to chemotherapy -Currently Grade 2, with 4-5 episodes  of large volume diarrhea with some trace hematochezia (from hemorrhoids) -Due to the worsening diarrhea, I have cancelled treatment this week -I encouraged the patient to maintain adequate hydration, and take PRN Imodium   Chemotherapy-associated mucositis  -Secondary to the SCCa of the tongue and LN involvement -Unable to tolerate IR liquid morphine -Pain currently well controlled with oxycodone 5mg  q6hrs PRN for pain  -Continue the regimen above; I have refilled the oxycodone prescription today   Protein malnutrition -Secondary to chemoRT -Weight down by 8lbs since the last visit -Currently taking 4 Osmolite and several bottles of water  -Due to ProSource possibly contributing to diarrhea, I have instructed the patient to stop the supplement -As he is taking 6-7 bottles of 16oz water per day with early satiety, I also instructed the patient to reduce free water intake so that he can try to increase his Osmolite  -I encouraged the patient to adhere to nutritional recommendations   Thick saliva -Secondary to radiation -Reasonably controlled with salt/soda rinses  -If thick saliva worsens in the future, we can consider adding guaifenesin  Stage II-III CKD -Baseline Cr ~1.2 -Cr 1.05 today, stable; electrolytes normal -We will monitor it closely   No orders of the defined types were placed in this encounter.  All questions were answered. The patient knows to call the clinic with any problems, questions or concerns. No barriers to learning was detected.  Return in 1 week for labs, clinic appt and the last cycle of chemotherapy.   [name], MD 12/20/2018 11:47 AM  CHIEF COMPLAINT: "I am having bad diarrhea"  INTERVAL HISTORY: 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  Patient reports that starting Sunday this week,  he began to develop new onset diarrhea, 4-5 episodes per day, large volume,  associated with rectal irritation from recurrent diarrhea.  He has a history of hemorrhoids, and has had some blood on the toilet paper with wiping.  He also has mild to moderate pain in the throat, for which he takes oxycodone 2-3 times a day with adequate pain control.  He was concerned that the protein supplement (Prosource) was causing his diarrhea, and he had to stop it.  He is currently taking 4 cans of Osmolite and at least 5 bottles of 16 ounce water via the feeding tube, as well as 2 bottles of water by mouth.  He denies any significant nausea vomiting.  He denies any other complaint today.  REVIEW OF SYSTEMS:   Constitutional: ( - ) fevers, ( - )  chills , ( - ) night sweats Eyes: ( - ) blurriness of vision, ( - ) double vision, ( - ) watery eyes Ears, nose, mouth, throat, and face: ( - ) mucositis, ( - ) sore throat Respiratory: ( - ) cough, ( - ) dyspnea, ( - ) wheezes Cardiovascular: ( - ) palpitation, ( - ) chest discomfort, ( - ) lower extremity swelling Gastrointestinal:  ( - ) nausea, ( - ) heartburn, ( + ) change in bowel habits Skin: ( + ) abnormal skin rashes Lymphatics: ( - ) new lymphadenopathy, ( - ) easy bruising Neurological: ( - ) numbness, ( - ) tingling, ( - ) new weaknesses Behavioral/Psych: ( - ) mood change, ( - ) new changes  All other systems were reviewed with the patient and are negative.  SUMMARY OF ONCOLOGIC HISTORY: Oncology History  Cancer of base of tongue ([place])  07/29/2018 Imaging   Neck ultrasound: IMPRESSION: 16 x 30 mm solid mass in the right submandibular area. This may represent a submandibular mass or enlarged lymph node. Recommend CT neck with contrast for further evaluation.   08/03/2018 Imaging   CT neck (at [facility]): IMPRESSION: 1. 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 There is a single ipsilateral malignant lymph node measuring 3 cm. 2. Nodular thickening along  the anterior right sublingual gland, attention on follow-up PET.   09/04/2018 Procedure   US-guided bx of the R submandibular mass   09/04/2018 Pathology Results   Accession: [id number]  Lymph node, needle/core biopsy, right submandibular - SQUAMOUS CELL CARCINOMA. - SEE MICROSCOPIC DESCRIPTION.   09/11/2018 Imaging   PET: IMPRESSION: Hypermetabolism along the right base of tongue, corresponding to the patient's known primary oropharyngeal cancer.   Ipsilateral level 2 cervical nodal metastasis.   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   11/16/2018 -  Chemotherapy   The patient had palonosetron (ALOXI) injection 0.25 mg, 0.25 mg, Intravenous,  Once, 5 of 7 cycles Administration: 0.25 mg (11/16/2018), 0.25 mg (11/23/2018), 0.25 mg (11/30/2018), 0.25 mg (12/07/2018), 0.25 mg (12/14/2018) CARBOplatin (PARAPLATIN) 200 mg in sodium chloride 0.9 % 250 mL chemo infusion, 200 mg (100 % of original dose 202.8 mg), Intravenous,  Once, 5 of 7 cycles Dose modification: 202.8 mg (original dose 202.8 mg, Cycle 1) Administration: 200 mg (11/16/2018), 200 mg (11/23/2018), 260 mg (11/30/2018), 260 mg (12/07/2018), 260 mg (12/14/2018)  for chemotherapy treatment.      I have reviewed the past medical history, past surgical history, social history and family history with the patient and they are unchanged  from previous note.  ALLERGIES:  is allergic to lisinopril.  MEDICATIONS:  Current Outpatient Medications  Medication Sig Dispense Refill  . Apixaban (ELIQUIS PO) Take 5 mg by mouth 2 (two) times daily.     Marland Kitchen dexamethasone (DECADRON) 4 MG tablet Take 2 tablets (8 mg total) by mouth daily. Start the day after chemotherapy for 2 days. Take with food. 30 tablet 1  . docusate sodium (COLACE) 100 MG capsule Take 100 mg by mouth as directed. Take 100 mg in AM,  200 mg in PM.    . hydrochlorothiazide (HYDRODIURIL) 25 MG  tablet Take 1 tablet (25 mg total) by mouth daily. 90 tablet 3  . lidocaine (XYLOCAINE) 2 % solution Patient: Mix 1part 2% viscous lidocaine, 1part H20. Swish & swallow 49mL of diluted mixture, 33min before meals and at bedtime, up to QID (Patient not taking: Reported on 12/06/2018) 100 mL 5  . lidocaine-prilocaine (EMLA) cream Apply to affected area once 30 g 3  . LORazepam (ATIVAN) 0.5 MG tablet Take 1 tablet (0.5 mg total) by mouth every 6 (six) hours as needed (Nausea or vomiting). (Patient not taking: Reported on 12/06/2018) 30 tablet 0  . Magnesium Citrate 100 MG TABS Take 1 tablet by mouth daily.    . metoprolol succinate (TOPROL-XL) 100 MG 24 hr tablet Take 1 tablet (100 mg total) by mouth daily. Take with or immediately following a meal. 90 tablet 3  . Nutritional Supplements (FEEDING SUPPLEMENT, OSMOLITE 1.5 CAL,) LIQD 1 1/2 bottles Osmolite 1.5 via PEG with 60 mL water before and after each bolus feeding QID. 1 OZ Prosource or equivalent via PEG QID with 60 mL water flush after. Provides 2530 kcal, 129 gm Pro: >90% estimated needs. Please send formula, Prosource and supplies. 1422 mL 6  . omeprazole (PRILOSEC) 20 MG capsule Take 20 mg by mouth daily.    . ondansetron (ZOFRAN) 8 MG tablet Take 1 tablet (8 mg total) by mouth 2 (two) times daily as needed for refractory nausea / vomiting. Start on day 3 after chemotherapy. (Patient not taking: Reported on 12/06/2018) 30 tablet 1  . oxyCODONE (OXY IR/ROXICODONE) 5 MG immediate release tablet Take 1 tablet (5 mg total) by mouth every 6 (six) hours as needed for severe pain. 60 tablet 0  . prochlorperazine (COMPAZINE) 10 MG tablet Take 1 tablet (10 mg total) by mouth every 6 (six) hours as needed (Nausea or vomiting). 30 tablet 1  . tamsulosin (FLOMAX) 0.4 MG CAPS capsule Take 0.4 mg by mouth daily after supper.     No current facility-administered medications for this visit.     PHYSICAL EXAMINATION: ECOG PERFORMANCE STATUS: 2 - Symptomatic, <50%  confined to bed  Today's Vitals   12/20/18 1129 12/20/18 1139  BP: 130/73   Pulse: 64   Resp: 18   Temp: 98.3 F (36.8 C)   TempSrc: Temporal   SpO2: 100%   Weight: 239 lb 3.2 oz (108.5 kg)   Height: 5\' 10"  (1.778 m)   PainSc:  3    Body mass index is 34.32 kg/m.  Filed Weights   12/20/18 1129  Weight: 239 lb 3.2 oz (108.5 kg)    GENERAL: alert, no distress and comfortable SKIN: mild to moderate erythematous skin around the neck from radiation treatment  EYES: conjunctiva are pink and non-injected, sclera clear OROPHARYNX: no exudate, no erythema; lips, buccal mucosa, and tongue normal  NECK: supple, non-tender LYMPH:  ~1x1cm right Level II LN, improving  LUNGS: clear to auscultation  with normal breathing effort HEART: regular rate & rhythm and no murmurs and no lower extremity edema ABDOMEN: soft, non-tender, non-distended, normal bowel sounds Musculoskeletal: no cyanosis of digits and no clubbing  PSYCH: alert & oriented x 3, fluent speech NEURO: no focal motor/sensory deficits  LABORATORY DATA:  I have reviewed the data as listed    Component Value Date/Time   NA 138 12/20/2018 0931   K 3.9 12/20/2018 0931   CL 103 12/20/2018 0931   CO2 26 12/20/2018 0931   GLUCOSE 100 (H) 12/20/2018 0931   BUN 20 12/20/2018 0931   CREATININE 1.05 12/20/2018 0931   CALCIUM 8.6 (L) 12/20/2018 0931   PROT 6.6 10/18/2018 0846   ALBUMIN 3.9 10/18/2018 0846   AST 21 10/18/2018 0846   ALT 19 10/18/2018 0846   ALKPHOS 39 10/18/2018 0846   BILITOT 0.6 10/18/2018 0846   GFRNONAA >60 12/20/2018 0931   GFRAA >60 12/20/2018 0931    No results found for: SPEP, UPEP  Lab Results  Component Value Date   WBC 3.6 (L) 12/20/2018   NEUTROABS 3.0 12/20/2018   HGB 15.1 12/20/2018   HCT 44.1 12/20/2018   MCV 87.0 12/20/2018   PLT 128 (L) 12/20/2018      Chemistry      Component Value Date/Time   NA 138 12/20/2018 0931   K 3.9 12/20/2018 0931   CL 103 12/20/2018 0931   CO2 26  12/20/2018 0931   BUN 20 12/20/2018 0931   CREATININE 1.05 12/20/2018 0931      Component Value Date/Time   CALCIUM 8.6 (L) 12/20/2018 0931   ALKPHOS 39 10/18/2018 0846   AST 21 10/18/2018 0846   ALT 19 10/18/2018 0846   BILITOT 0.6 10/18/2018 0846       RADIOGRAPHIC STUDIES: I have personally reviewed the radiological images as listed below and agreed with the findings in the report. No results found.

## 2018-12-20 NOTE — Telephone Encounter (Signed)
Pt having treatment tomorrow requested to stay accessed biopatch placed and heplocked

## 2018-12-21 ENCOUNTER — Ambulatory Visit: Payer: Medicare Other

## 2018-12-21 ENCOUNTER — Other Ambulatory Visit: Payer: Self-pay

## 2018-12-21 ENCOUNTER — Ambulatory Visit
Admission: RE | Admit: 2018-12-21 | Discharge: 2018-12-21 | Disposition: A | Discharge status: Home / Self Care | Payer: Medicare Other | Source: Ambulatory Visit | Attending: Radiation Oncology | Admitting: Radiation Oncology

## 2018-12-21 ENCOUNTER — Inpatient Hospital Stay: Payer: Medicare Other | Admitting: Nutrition

## 2018-12-21 DIAGNOSIS — Z510 Encounter for antineoplastic radiation therapy: Secondary | ICD-10-CM | POA: Diagnosis not present

## 2018-12-21 DIAGNOSIS — C01 Malignant neoplasm of base of tongue: Secondary | ICD-10-CM | POA: Diagnosis not present

## 2018-12-21 NOTE — Progress Notes (Signed)
I have attempted to contact patient by phone but was unable to reach him.  He has my name and phone number for return call.

## 2018-12-22 ENCOUNTER — Other Ambulatory Visit: Payer: Self-pay

## 2018-12-22 ENCOUNTER — Ambulatory Visit
Admission: RE | Admit: 2018-12-22 | Discharge: 2018-12-22 | Disposition: A | Discharge status: Home / Self Care | Payer: Medicare Other | Source: Ambulatory Visit | Attending: Radiation Oncology | Admitting: Radiation Oncology

## 2018-12-22 ENCOUNTER — Encounter: Payer: Self-pay | Admitting: *Deleted

## 2018-12-22 DIAGNOSIS — C01 Malignant neoplasm of base of tongue: Secondary | ICD-10-CM | POA: Diagnosis not present

## 2018-12-22 DIAGNOSIS — Z510 Encounter for antineoplastic radiation therapy: Secondary | ICD-10-CM | POA: Diagnosis not present

## 2018-12-22 NOTE — Progress Notes (Signed)
Oncology Nurse Navigator Documentation  In follow-up to yesterday's unsuccessful effort to improve PEG patency by pulsing/witdrawing water with syringe, met with Mr. Lafontant s/p this morning's RT to apply techniques per guidance provided by [employer] Kathy Mazzepa.  RN Jennifer Malmfelt provided assistance.  With Mr. Reser in a partially reclined position, slid retention ring, located at 5 cm, to mid-length of tube.  Unsuccessfully attempted several times to push tube inward with intent to then withdrawal to facilitate repositioning.  Tube patency checked with water flushes.  Gravity flush unsuccessful, flushes with syringe still met with resistance similar to yesterday.    Then cut/removed adapter and inserted green DeClogger full length of feeding tube with twisting motion to dislodge any blockage, repeated x2.  Mr. Hyer indicated he has been putting only supplement and water through PEG.  New adapter applied to tube, patency checked via gravity flush and showed improvement.  Flushes with syringe met with notably less resistance.  After adjusting retention ring to 6 cm and returning Mr. Bieler to sitting position, flushes were attempted and again impeded.  Adjusting RT to 7 cm allowed for improved gravity and syringe flushes.  Mr. Jagoda expressed satisfaction with improvement.   Encouraged Mr. Mallinger to recline as much as able when using PEG as this seemed to facilitate flow.  Cleaned site with NS, redressed with 3 split gauze under RT to account for increased. He voiced understanding I will check with him Monday to check on tube status.  Rick , RN, BSN Head & Neck Oncology Nurse Navigator [facility] at [place] [phone]  

## 2018-12-25 ENCOUNTER — Ambulatory Visit
Admission: RE | Admit: 2018-12-25 | Discharge: 2018-12-25 | Disposition: A | Discharge status: Home / Self Care | Payer: Medicare Other | Source: Ambulatory Visit | Attending: Radiation Oncology | Admitting: Radiation Oncology

## 2018-12-25 ENCOUNTER — Other Ambulatory Visit: Payer: Self-pay

## 2018-12-25 ENCOUNTER — Encounter: Payer: Self-pay | Admitting: *Deleted

## 2018-12-25 ENCOUNTER — Ambulatory Visit: Payer: Medicare Other | Attending: Radiation Oncology

## 2018-12-25 DIAGNOSIS — C01 Malignant neoplasm of base of tongue: Secondary | ICD-10-CM | POA: Diagnosis not present

## 2018-12-25 DIAGNOSIS — R1310 Dysphagia, unspecified: Secondary | ICD-10-CM | POA: Insufficient documentation

## 2018-12-25 DIAGNOSIS — Z510 Encounter for antineoplastic radiation therapy: Secondary | ICD-10-CM | POA: Diagnosis not present

## 2018-12-25 NOTE — Patient Instructions (Signed)
Signs of Aspiration Pneumonia   . Chest pain/tightness . Fever (can be low grade) . Cough  o With foul-smelling phlegm (sputum) o With sputum containing pus or blood o With greenish sputum . Fatigue  . Shortness of breath  . Wheezing   **IF YOU HAVE THESE SIGNS, CONTACT YOUR DOCTOR OR GO TO THE EMERGENCY DEPARTMENT OR URGENT CARE AS SOON AS POSSIBLE**      

## 2018-12-25 NOTE — Progress Notes (Signed)
Oncology Nurse Navigator Documentation  Spoke with Mr. Piwowar prior to RT to check on status of PEG.  He reported still unable to conduct gravity water flushes/supplement instillations but administration using plunger continues to work well.  I offered option of seeing [facility] IR for additional evaluation but he denied the need.  I encouraged him to keep me posted if PEG becomes more problematic.  He agreed to do so.  Gayleen Orem, RN, BSN Head & Neck Oncology Nurse [place] at [place] [phone]

## 2018-12-25 NOTE — Therapy (Signed)
[facility] [address] [place], [place], [address] Phone: [phone]   Fax:  [phone]  Speech Language Pathology Treatment  Patient Details  Name: Neil [name] MRN: [id number] Date of Birth: 08/04/1945 Referring Provider (SLP): Lonie Peak, MD   Encounter Date: 12/25/2018  End of Session - 12/25/18 1219    Visit Number  3    Number of Visits  4    Date for SLP Re-Evaluation  01/26/19    SLP Start Time  1103    SLP Stop Time   1135    SLP Time Calculation (min)  32 min    Activity Tolerance  Patient tolerated treatment well       Past Medical History:  Diagnosis Date  . Atrial fibrillation (HCC)   . Chronic anticoagulation    On Eliquis  . GERD (gastroesophageal reflux disease)   . Hypertension   . Joint pain     Past Surgical History:  Procedure Laterality Date  . HEMORROIDECTOMY  2004  . HERNIA REPAIR  1960  . IR GASTROSTOMY TUBE MOD SED  11/07/2018  . IR IMAGING GUIDED PORT INSERTION  11/07/2018  . IR US GUIDANCE  09/04/2018  . IR US GUIDE BX ASP/DRAIN  09/04/2018  . MOUTH BIOPSY  09/04/2018   right jaw  . right testical removed    . TONSILLECTOMY      There were no vitals filed for this visit.  Subjective Assessment - 12/25/18 1107    Subjective  Pt drinking water WNL. Pt states water, ice cream and rare boost/ensure PO "(the boost/ensure) is almost too thick now."    Currently in Pain?  Yes    Pain Score  3     Pain Location  Throat    Pain Orientation  Right    Pain Descriptors / Indicators  Burning;Sharp    Pain Type  Acute pain    Pain Onset  1 to 4 weeks ago            ADULT SLP TREATMENT - 12/25/18 1110      General Information   Behavior/Cognition  Alert;Cooperative;Pleasant mood      Dysphagia Treatment   Temperature Spikes Noted  No    Respiratory Status  Room air    Oral Cavity - Dentition  Edentulous    Treatment Methods  Therapeutic exercise;Skilled  observation;Compensation strategy training;Patient/caregiver education    Patient observed directly with PO's  Yes    Type of PO's observed  Thin liquids    Liquids provided via  --   bottle   Oral Phase Signs & Symptoms  --   none noted   Pharyngeal Phase Signs & Symptoms  --   none noted   Other treatment/comments  Pt reports dysgeusia, coupled with dull pain after administering hydrocodone makes POs very difficult. Pt reports water goes down well - demonstrated this in session today x10 sips. "I have to be careful - if I tip it up I have difficulty." With HEP pt completion with modified independence. SLP encouraged pt to incr reps of swallowing exercses as pain subsides; discussed modifications for rep #s and switching between swallow and non-swallow exercises. SLP edcated pt re: food journal and pt remarked how that oculd really help him.      Assessment / Recommendations / Plan   Plan  Continue with current plan of care      Dysphagia Recommendations   Diet recommendations  --   as tolerated  Liquids provided via  Cup    Medication Administration  --   as tolereated     Progression Toward Goals   Progression toward goals  Progressing toward goals       SLP Education - 12/25/18 1219    Education Details  s/sx aspiration PNA, how to alter HEP for pain, food journal    Person(s) Educated  Patient    Methods  Explanation;Verbal cues;Handout    Comprehension  Verbalized understanding;Returned demonstration       SLP Short Term Goals - 11/27/18 1422      SLP SHORT TERM GOAL #1   Title  Pt will demo understanding of proper procedure for HEP with rare min A    Period  --   session   Status  Partially Met      SLP SHORT TERM GOAL #2   Title  pt will tell SLP why he is completing HEP    Period  --   session   Status  Partially Met       SLP Long Term Goals - 12/25/18 1222      SLP LONG TERM GOAL #1   Title  pt will demo understanding of proper HEP procedure with  modified indpendence x2 sessions    Baseline  12-25-18    Time  1    Period  --   sessions   Status  Revised      SLP LONG TERM GOAL #2   Title  pt will tell SLP 3 overt s/sx aspiration PNA with modified independence    Status  Achieved      SLP LONG TERM GOAL #3   Title  pt will tell the benefit of a food journal for return to pre-chemorad diet    Status  Achieved      SLP LONG TERM GOAL #4   Title  pt will tell when to decr frequency of HEP from daily to x2-3/week x2 sessions    Time  4    Period  --   visits   Status  New       Plan - 12/25/18 1220    Clinical Impression Statement  Pt presents today with [facility] swalowing for liquids with compensation of single sips. Pt is not eating solids mostly due to dysgeusia and copious thick mucus in pharyngeal area. No overt s/sx of aspiration PNA noted today, nor reported. Pt completed HEP today targeting musculature possibly affected by radiation with rare min A; see "pt education". Data show that probability of dysphagia increases during chemorad tx and afterwards, and is mitigated by HEP focusing on swallowing musculature. Pt would benefit from follow up skilled ST to assess pt's safety with POs and also to assess pt's ability to correctly perform HEP. Visits will be performed virtually (telephone or WEbex) or in - person.    Speech Therapy Frequency  --   approx once every 4 weeks   Duration  --   90 days (4 total sessions)   Treatment/Interventions  Aspiration precaution training;Pharyngeal strengthening exercises;Diet toleration management by SLP;Internal/external aids;Patient/family education;SLP instruction and feedback;Trials of upgraded texture/liquids;Environmental controls    Potential to Achieve Goals  Good    SLP Home Exercise Plan  provided today       Patient will benefit from skilled therapeutic intervention in order to improve the following deficits and impairments:   Dysphagia, unspecified type    Problem  List Patient Active Problem List   Diagnosis Date Noted  . Chemotherapy-induced  thrombocytopenia 12/06/2018  . Chemotherapy induced diarrhea 11/29/2018  . Protein malnutrition (HCC) 11/29/2018  . Port-A-Cath in place 11/22/2018  . Chemotherapy-induced nausea 11/22/2018  . Mucositis due to chemotherapy 11/22/2018  . Constipation due to opioid therapy 11/22/2018  . CKD (chronic kidney disease), stage III 10/18/2018  . Cancer of base of tongue (HCC) 10/16/2018    [facility] ,MS, CCC-SLP  12/25/2018, 12:24 PM  [facility] [address] [place], [place], [address] Phone: [phone]   Fax:  [phone]   Name: Neil [name] MRN: [id number] Date of Birth: 1945-12-18

## 2018-12-26 ENCOUNTER — Other Ambulatory Visit: Payer: Self-pay

## 2018-12-26 ENCOUNTER — Ambulatory Visit
Admission: RE | Admit: 2018-12-26 | Discharge: 2018-12-26 | Disposition: A | Discharge status: Home / Self Care | Payer: Medicare Other | Source: Ambulatory Visit | Attending: Radiation Oncology | Admitting: Radiation Oncology

## 2018-12-26 DIAGNOSIS — Z510 Encounter for antineoplastic radiation therapy: Secondary | ICD-10-CM | POA: Diagnosis not present

## 2018-12-26 DIAGNOSIS — C01 Malignant neoplasm of base of tongue: Secondary | ICD-10-CM | POA: Diagnosis not present

## 2018-12-27 ENCOUNTER — Ambulatory Visit
Admission: RE | Admit: 2018-12-27 | Discharge: 2018-12-27 | Disposition: A | Discharge status: Home / Self Care | Payer: Medicare Other | Source: Ambulatory Visit | Attending: Radiation Oncology | Admitting: Radiation Oncology

## 2018-12-27 ENCOUNTER — Telehealth: Payer: Self-pay | Admitting: Hematology

## 2018-12-27 ENCOUNTER — Encounter: Payer: Self-pay | Admitting: Hematology

## 2018-12-27 ENCOUNTER — Other Ambulatory Visit: Payer: Self-pay

## 2018-12-27 ENCOUNTER — Inpatient Hospital Stay: Payer: Medicare Other

## 2018-12-27 ENCOUNTER — Inpatient Hospital Stay (HOSPITAL_BASED_OUTPATIENT_CLINIC_OR_DEPARTMENT_OTHER): Payer: Medicare Other | Admitting: Hematology

## 2018-12-27 VITALS — BP 131/84 | HR 105 | Temp 97.6°F | Resp 18 | Ht 70.0 in | Wt 237.9 lb

## 2018-12-27 DIAGNOSIS — D6959 Other secondary thrombocytopenia: Secondary | ICD-10-CM | POA: Diagnosis not present

## 2018-12-27 DIAGNOSIS — C01 Malignant neoplasm of base of tongue: Secondary | ICD-10-CM | POA: Diagnosis not present

## 2018-12-27 DIAGNOSIS — R110 Nausea: Secondary | ICD-10-CM

## 2018-12-27 DIAGNOSIS — N183 Chronic kidney disease, stage 3 unspecified: Secondary | ICD-10-CM | POA: Diagnosis not present

## 2018-12-27 DIAGNOSIS — E46 Unspecified protein-calorie malnutrition: Secondary | ICD-10-CM | POA: Diagnosis not present

## 2018-12-27 DIAGNOSIS — K521 Toxic gastroenteritis and colitis: Secondary | ICD-10-CM | POA: Diagnosis not present

## 2018-12-27 DIAGNOSIS — I4891 Unspecified atrial fibrillation: Secondary | ICD-10-CM | POA: Diagnosis not present

## 2018-12-27 DIAGNOSIS — T451X5A Adverse effect of antineoplastic and immunosuppressive drugs, initial encounter: Secondary | ICD-10-CM

## 2018-12-27 DIAGNOSIS — Z5111 Encounter for antineoplastic chemotherapy: Secondary | ICD-10-CM | POA: Diagnosis not present

## 2018-12-27 DIAGNOSIS — K1231 Oral mucositis (ulcerative) due to antineoplastic therapy: Secondary | ICD-10-CM

## 2018-12-27 DIAGNOSIS — Z510 Encounter for antineoplastic radiation therapy: Secondary | ICD-10-CM | POA: Diagnosis not present

## 2018-12-27 DIAGNOSIS — Z95828 Presence of other vascular implants and grafts: Secondary | ICD-10-CM

## 2018-12-27 LAB — CBC WITH DIFFERENTIAL (CANCER CENTER ONLY)
Abs Immature Granulocytes: 0.06 10*3/uL (ref 0.00–0.07)
Basophils Absolute: 0 10*3/uL (ref 0.0–0.1)
Basophils Relative: 1 %
Eosinophils Absolute: 0 10*3/uL (ref 0.0–0.5)
Eosinophils Relative: 0 %
HCT: 42.1 % (ref 39.0–52.0)
Hemoglobin: 14.4 g/dL (ref 13.0–17.0)
Immature Granulocytes: 2 %
Lymphocytes Relative: 6 %
Lymphs Abs: 0.2 10*3/uL — ABNORMAL LOW (ref 0.7–4.0)
MCH: 29.9 pg (ref 26.0–34.0)
MCHC: 34.2 g/dL (ref 30.0–36.0)
MCV: 87.3 fL (ref 80.0–100.0)
Monocytes Absolute: 0.3 10*3/uL (ref 0.1–1.0)
Monocytes Relative: 11 %
Neutro Abs: 2.4 10*3/uL (ref 1.7–7.7)
Neutrophils Relative %: 80 %
Platelet Count: 134 10*3/uL — ABNORMAL LOW (ref 150–400)
RBC: 4.82 MIL/uL (ref 4.22–5.81)
RDW: 15.2 % (ref 11.5–15.5)
WBC Count: 3 10*3/uL — ABNORMAL LOW (ref 4.0–10.5)
nRBC: 0 % (ref 0.0–0.2)

## 2018-12-27 LAB — BASIC METABOLIC PANEL - CANCER CENTER ONLY
Anion gap: 12 (ref 5–15)
BUN: 17 mg/dL (ref 8–23)
CO2: 24 mmol/L (ref 22–32)
Calcium: 8.5 mg/dL — ABNORMAL LOW (ref 8.9–10.3)
Chloride: 103 mmol/L (ref 98–111)
Creatinine: 1 mg/dL (ref 0.61–1.24)
GFR, Est AFR Am: >60 mL/min (ref >60)
GFR, Estimated: >60 mL/min (ref >60)
Glucose, Bld: 95 mg/dL (ref 70–99)
Potassium: 4 mmol/L (ref 3.5–5.1)
Sodium: 139 mmol/L (ref 135–145)

## 2018-12-27 LAB — MAGNESIUM: Magnesium: 2 mg/dL (ref 1.7–2.4)

## 2018-12-27 MED ORDER — SODIUM CHLORIDE 0.9% FLUSH
10.0000 mL | INTRAVENOUS | Status: DC | PRN
  Administered 2018-12-27: 10 mL
  Filled 2018-12-27: qty 10

## 2018-12-27 MED ORDER — OXYCODONE HCL 5 MG PO TABS: 5.0000 mg | ORAL_TABLET | Freq: Four times a day (QID) | ORAL | 0 refills | Status: DC | PRN

## 2018-12-27 MED ORDER — HEPARIN SOD (PORK) LOCK FLUSH 100 UNIT/ML IV SOLN
500.0000 [IU] | Freq: Once | INTRAVENOUS | Status: AC | PRN
  Administered 2018-12-27: 500 [IU]
  Filled 2018-12-27: qty 5

## 2018-12-27 NOTE — Telephone Encounter (Signed)
Scheduled per 10/21 los, patient received after visit summary and calender. °

## 2018-12-27 NOTE — Progress Notes (Signed)
[place] OFFICE PROGRESS NOTE  Patient Care Team: Bartholome Bill, MD as PCP - General (Family Medicine) O'Neal, Cassie Freer, MD as PCP - Cardiology (Cardiology) Eppie Gibson, MD as Attending Physician (Radiation Oncology) Leota Sauers, RN as Oncology Nurse Navigator Schinke, [place], [place] as Speech Language Pathologist (Speech Pathology) Karie Mainland, RD as Dietitian (Nutrition) Kennith Center, LCSW as Social Worker  HEME/ONC OVERVIEW: 1. 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 A-fib on Eliquis   2. 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, p16+ -S/p 5 cycle of weekly carboplatin concurrent with RT in the definitive setting; cycle 6 delayed due to worsening diarrhea and weight loss  -Labs adequate today, proceed with the Cycle 6 (and the last cycle) of weekly carboplatin -RT to complete on 01/03/2019  -PRN antiemetics, Zofran, Compazine, dexamethasone, Ativan  Chemotherapy-associated thrombocytopenia -Secondary to chemotherapy -Plts 134k, stable -We will monitor for now; no indication for dose adjustment  Chemotherapy-associated leukopenia -Secondary to chemotherapy -WBC 3.0k with ANC ~2400, stable   -Patient denies any symptoms of infection -We will monitor for now  Chemotherapy-associated diarrhea -Secondary to chemotherapy -Improving, currently Grade 1  -We will monitor it for now  Chemotherapy-associated mucositis   -Secondary to the SCCa of the tongue and LN involvement -Unable to tolerate IR liquid morphine -Currently well controlled with oxycodone 5mg  q6hrs PRN for pain  -Continue the regimen above; I have refilled the oxycodone prescription today   Chemotherapy-associated nausea  -Secondary to chemotherapy -Symptoms relatively well controlled  -Continue PRN-anti-emetics   Protein malnutrition -Secondary to chemoRT -Weight down by 2lbs since the last visit -Currently taking 5-6 Osmolite and several bottles of water  -I encouraged the patient to adhere to nutritional recommendations   Thick saliva -Secondary to radiation -Reasonably controlled with salt/soda rinses  -If thick saliva worsens in the future, we can consider adding guaifenesin  Stage II-III CKD -Baseline 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  All questions were answered. The patient knows to call the clinic with any problems, questions or concerns. No barriers to learning was detected.  Return in 1 week for labs, port flush, and clinic appt.   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  Patient reports that after holding chemotherapy last week, his energy level was better and he felt more rested.  His diarrhea has also improved, and he currently has 1 loose bowel movement every 3 to 4 days.  He still has occasional nausea, for which he takes Compazine with relief.  He denies any vomiting.  He is currently taking oxycodone approximately 3 times a day for mucositis pain, which appears to be adequate.  He is currently taking 5 cans of  Osmolite (occasionally 6) and approximately 5 bottles of 16 ounce water via the feeding tube, as well as 2 bottles  of water by mouth.  He denies any other complaint today.  REVIEW OF SYSTEMS:   Constitutional: ( - ) fevers, ( - )  chills , ( - ) night sweats Eyes: ( - ) blurriness of vision, ( - ) double vision, ( - ) watery eyes Ears, nose, mouth, throat, and face: ( + ) mucositis, ( + ) sore throat Respiratory: ( - ) cough, ( - ) dyspnea, ( - ) wheezes Cardiovascular: ( - ) palpitation, ( - ) chest discomfort, ( - ) lower extremity swelling Gastrointestinal:  ( + ) nausea, ( - ) heartburn, ( + ) change in bowel habits Skin: ( - ) abnormal skin rashes Lymphatics: ( - ) new lymphadenopathy, ( - ) easy bruising Neurological: ( - ) numbness, ( - ) tingling, ( - ) new weaknesses Behavioral/Psych: ( - ) mood change, ( - ) new changes  All other systems were reviewed with the patient and are negative.  SUMMARY OF ONCOLOGIC HISTORY: Oncology History  Cancer of base of tongue ([place])  07/29/2018 Imaging   Neck ultrasound: IMPRESSION: 16 x 30 mm solid mass in the right submandibular area. This may represent a submandibular mass or enlarged lymph node. Recommend CT neck with contrast for further evaluation.   08/03/2018 Imaging   CT neck (at [facility]): IMPRESSION: 1. 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 There is a single ipsilateral malignant lymph node measuring 3 cm. 2. Nodular thickening along the anterior right sublingual gland, attention on follow-up PET.   09/04/2018 Procedure   US-guided bx of the R submandibular mass   09/04/2018 Pathology Results   Accession: [id number]  Lymph node, needle/core biopsy, right submandibular - SQUAMOUS CELL CARCINOMA. - SEE MICROSCOPIC DESCRIPTION.   09/11/2018 Imaging   PET: IMPRESSION: Hypermetabolism along the right base of tongue, corresponding to the patient's known primary oropharyngeal cancer.   Ipsilateral level 2 cervical nodal metastasis.   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   11/16/2018 -  Chemotherapy   The patient had palonosetron (ALOXI) injection 0.25 mg, 0.25 mg, Intravenous,  Once, 5 of 7 cycles Administration: 0.25 mg (11/16/2018), 0.25 mg (11/23/2018), 0.25 mg (11/30/2018), 0.25 mg (12/07/2018), 0.25 mg (12/14/2018) CARBOplatin (PARAPLATIN) 200 mg in sodium chloride 0.9 % 250 mL chemo infusion, 200 mg (100 % of original dose 202.8 mg), Intravenous,  Once, 5 of 7 cycles Dose modification: 202.8 mg (original dose 202.8 mg, Cycle 1) Administration: 200 mg (11/16/2018), 200 mg (11/23/2018), 260 mg (11/30/2018), 260 mg (12/07/2018), 260 mg (12/14/2018)  for chemotherapy treatment.      I have reviewed the past medical history, past surgical history, social history and family history with the patient and they are unchanged from previous note.  ALLERGIES:  is allergic to lisinopril.  MEDICATIONS:  Current Outpatient Medications  Medication Sig Dispense Refill  . Apixaban (ELIQUIS PO) Take 5 mg by mouth 2 (two) times daily.     Marland Kitchen lidocaine (XYLOCAINE) 2 % solution Patient: Mix 1part 2% viscous lidocaine, 1part H20. Swish & swallow 80mL of diluted mixture, 48min before meals and at bedtime, up to QID 100 mL 5  . lidocaine-prilocaine (EMLA) cream Apply to affected area once 30 g 3  . metoprolol succinate (TOPROL-XL) 100 MG 24 hr tablet  Take 1 tablet (100 mg total) by mouth daily. Take with or immediately following a meal. 90 tablet 3  . Nutritional Supplements (FEEDING SUPPLEMENT, OSMOLITE 1.5 CAL,) LIQD 1 1/2 bottles Osmolite 1.5 via PEG with 60 mL water before and after each bolus feeding QID. 1 OZ Prosource or equivalent via PEG QID with 60 mL water flush after. Provides 2530 kcal, 129 gm Pro: >90% estimated needs. Please send formula, Prosource and supplies. 1422 mL 6  . omeprazole (PRILOSEC) 20 MG capsule Take 20 mg by mouth daily.    Marland Kitchen oxyCODONE (OXY  IR/ROXICODONE) 5 MG immediate release tablet Take 1 tablet (5 mg total) by mouth every 6 (six) hours as needed for severe pain. 60 tablet 0  . prochlorperazine (COMPAZINE) 10 MG tablet Take 1 tablet (10 mg total) by mouth every 6 (six) hours as needed (Nausea or vomiting). 30 tablet 1  . tamsulosin (FLOMAX) 0.4 MG CAPS capsule Take 0.4 mg by mouth daily after supper.    . dexamethasone (DECADRON) 4 MG tablet Take 2 tablets (8 mg total) by mouth daily. Start the day after chemotherapy for 2 days. Take with food. (Patient not taking: Reported on 12/27/2018) 30 tablet 1  . docusate sodium (COLACE) 100 MG capsule Take 100 mg by mouth as directed. Take 100 mg in AM,  200 mg in PM.    . hydrochlorothiazide (HYDRODIURIL) 25 MG tablet Take 1 tablet (25 mg total) by mouth daily. (Patient not taking: Reported on 12/27/2018) 90 tablet 3  . LORazepam (ATIVAN) 0.5 MG tablet Take 1 tablet (0.5 mg total) by mouth every 6 (six) hours as needed (Nausea or vomiting). (Patient not taking: Reported on 12/06/2018) 30 tablet 0  . Magnesium Citrate 100 MG TABS Take 1 tablet by mouth daily.    . ondansetron (ZOFRAN) 8 MG tablet Take 1 tablet (8 mg total) by mouth 2 (two) times daily as needed for refractory nausea / vomiting. Start on day 3 after chemotherapy. (Patient not taking: Reported on 12/06/2018) 30 tablet 1   No current facility-administered medications for this visit.     PHYSICAL EXAMINATION: ECOG PERFORMANCE STATUS: 1 - Symptomatic but completely ambulatory  Today's Vitals   12/27/18 1127  BP: 131/84  Pulse: (!) 105  Resp: 18  Temp: 97.6 F (36.4 C)  TempSrc: Oral  SpO2: 100%  Weight: 237 lb 14.4 oz (107.9 kg)  Height: 5\' 10"  (1.778 m)   Body mass index is 34.14 kg/m.  Filed Weights   12/27/18 1127  Weight: 237 lb 14.4 oz (107.9 kg)    GENERAL: alert, no distress and comfortable SKIN: Erythematous skin over bilateral neck from radiation induced skin irritation EYES: conjunctiva are pink and  non-injected, sclera clear OROPHARYNX: no exudate, no erythema; lips, buccal mucosa, and tongue normal  NECK: supple, non-tender LYMPH: Approximately 1.5 x 1 cm right cervical LN, mobile, nontender, overall stable in size LUNGS: clear to auscultation with normal breathing effort HEART: regular rate & rhythm and no murmurs and no lower extremity edema ABDOMEN: soft, non-tender, non-distended, normal bowel sounds Musculoskeletal: no cyanosis of digits and no clubbing  PSYCH: alert & oriented x 3, fluent speech NEURO: no focal motor/sensory deficits  LABORATORY DATA:  I have reviewed the data as listed    Component Value Date/Time   NA 139 12/27/2018 1040   K 4.0 12/27/2018 1040   CL 103 12/27/2018 1040   CO2 24 12/27/2018 1040   GLUCOSE 95 12/27/2018 1040   BUN 17 12/27/2018 1040  CREATININE 1.00 12/27/2018 1040   CALCIUM 8.5 (L) 12/27/2018 1040   PROT 6.6 10/18/2018 0846   ALBUMIN 3.9 10/18/2018 0846   AST 21 10/18/2018 0846   ALT 19 10/18/2018 0846   ALKPHOS 39 10/18/2018 0846   BILITOT 0.6 10/18/2018 0846   GFRNONAA >60 12/27/2018 1040   GFRAA >60 12/27/2018 1040    No results found for: SPEP, UPEP  Lab Results  Component Value Date   WBC 3.0 (L) 12/27/2018   NEUTROABS 2.4 12/27/2018   HGB 14.4 12/27/2018   HCT 42.1 12/27/2018   MCV 87.3 12/27/2018   PLT 134 (L) 12/27/2018      Chemistry      Component Value Date/Time   NA 139 12/27/2018 1040   K 4.0 12/27/2018 1040   CL 103 12/27/2018 1040   CO2 24 12/27/2018 1040   BUN 17 12/27/2018 1040   CREATININE 1.00 12/27/2018 1040      Component Value Date/Time   CALCIUM 8.5 (L) 12/27/2018 1040   ALKPHOS 39 10/18/2018 0846   AST 21 10/18/2018 0846   ALT 19 10/18/2018 0846   BILITOT 0.6 10/18/2018 0846       RADIOGRAPHIC STUDIES: I have personally reviewed the radiological images as listed below and agreed with the findings in the report. No results found.

## 2018-12-28 ENCOUNTER — Inpatient Hospital Stay: Payer: Medicare Other | Admitting: Nutrition

## 2018-12-28 ENCOUNTER — Inpatient Hospital Stay: Payer: Medicare Other

## 2018-12-28 ENCOUNTER — Other Ambulatory Visit: Payer: Self-pay

## 2018-12-28 ENCOUNTER — Ambulatory Visit
Admission: RE | Admit: 2018-12-28 | Discharge: 2018-12-28 | Disposition: A | Discharge status: Home / Self Care | Payer: Medicare Other | Source: Ambulatory Visit | Attending: Radiation Oncology | Admitting: Radiation Oncology

## 2018-12-28 VITALS — BP 139/81 | HR 82 | Temp 98.0°F | Resp 18

## 2018-12-28 DIAGNOSIS — I4891 Unspecified atrial fibrillation: Secondary | ICD-10-CM | POA: Diagnosis not present

## 2018-12-28 DIAGNOSIS — D6959 Other secondary thrombocytopenia: Secondary | ICD-10-CM | POA: Diagnosis not present

## 2018-12-28 DIAGNOSIS — N183 Chronic kidney disease, stage 3 unspecified: Secondary | ICD-10-CM | POA: Diagnosis not present

## 2018-12-28 DIAGNOSIS — Z510 Encounter for antineoplastic radiation therapy: Secondary | ICD-10-CM | POA: Diagnosis not present

## 2018-12-28 DIAGNOSIS — C01 Malignant neoplasm of base of tongue: Secondary | ICD-10-CM

## 2018-12-28 DIAGNOSIS — Z5111 Encounter for antineoplastic chemotherapy: Secondary | ICD-10-CM | POA: Diagnosis not present

## 2018-12-28 DIAGNOSIS — R110 Nausea: Secondary | ICD-10-CM | POA: Diagnosis not present

## 2018-12-28 MED ORDER — DEXAMETHASONE SODIUM PHOSPHATE 10 MG/ML IJ SOLN
10.0000 mg | Freq: Once | INTRAMUSCULAR | Status: AC
  Administered 2018-12-28: 10 mg via INTRAVENOUS

## 2018-12-28 MED ORDER — PALONOSETRON HCL INJECTION 0.25 MG/5ML
0.2500 mg | Freq: Once | INTRAVENOUS | Status: AC
  Administered 2018-12-28: 0.25 mg via INTRAVENOUS

## 2018-12-28 MED ORDER — SODIUM CHLORIDE 0.9% FLUSH
10.0000 mL | INTRAVENOUS | Status: DC | PRN
  Administered 2018-12-28: 10:00:00 10 mL
  Filled 2018-12-28: qty 10

## 2018-12-28 MED ORDER — SODIUM CHLORIDE 0.9 % IV SOLN
Freq: Once | INTRAVENOUS | Status: AC
  Administered 2018-12-28: 09:00:00 via INTRAVENOUS
  Filled 2018-12-28: qty 250

## 2018-12-28 MED ORDER — HEPARIN SOD (PORK) LOCK FLUSH 100 UNIT/ML IV SOLN
500.0000 [IU] | Freq: Once | INTRAVENOUS | Status: AC | PRN
  Administered 2018-12-28: 500 [IU]
  Filled 2018-12-28: qty 5

## 2018-12-28 MED ORDER — DEXAMETHASONE SODIUM PHOSPHATE 10 MG/ML IJ SOLN
INTRAMUSCULAR | Status: AC
  Filled 2018-12-28: qty 1

## 2018-12-28 MED ORDER — PALONOSETRON HCL INJECTION 0.25 MG/5ML
INTRAVENOUS | Status: AC
  Filled 2018-12-28: qty 5

## 2018-12-28 MED ORDER — SODIUM CHLORIDE 0.9 % IV SOLN
260.0000 mg | Freq: Once | INTRAVENOUS | Status: AC
  Administered 2018-12-28: 260 mg via INTRAVENOUS
  Filled 2018-12-28: qty 26

## 2018-12-28 NOTE — Progress Notes (Signed)
Nutrition follow-up completed with patient after infusion for tongue cancer. Weight continues to decrease and was documented as 237.9 pounds on October 21. Patient reports he is using 5 bottles of Osmolite 1.5 and 5 bottles of water every day. He reports he has heartburn which prevents him from increasing Osmolite 1.5. Patient reports his bowels are moving without difficulty. He feels well overall.  Estimated nutrition needs: 2440-2640 cal, 130-150 g protein, 2.6 L fluid.  Nutrition diagnosis: Inadequate oral intake continues.  Intervention: Educated patient to discuss heartburn with Dr. In order to improve/resolve. Encouraged him to work up to 6 bottles Osmolite 1.5 daily. Patient finishes treatment next week and does not really want to have to come back into the cancer center.  He is requesting follow-up by phone.  Monitoring, evaluation, goals: Patient will tolerate adequate tube feeding/oral intake to minimize further weight loss and promote healing.  Next visit: Thursday, November 5 by telephone.  **Disclaimer: This note was dictated with voice recognition software. Similar sounding words can inadvertently be transcribed and this note may contain transcription errors which may not have been corrected upon publication of note.**

## 2018-12-28 NOTE — Patient Instructions (Signed)
[facility] Discharge Instructions for Patients Receiving Chemotherapy  Today you received the following chemotherapy agents Carboplatin  To help prevent nausea and vomiting after your treatment, we encourage you to take your nausea medication as directed   If you develop nausea and vomiting that is not controlled by your nausea medication, call the clinic.   BELOW ARE SYMPTOMS THAT SHOULD BE REPORTED IMMEDIATELY:  *FEVER GREATER THAN 100.5 F  *CHILLS WITH OR WITHOUT FEVER  NAUSEA AND VOMITING THAT IS NOT CONTROLLED WITH YOUR NAUSEA MEDICATION  *UNUSUAL SHORTNESS OF BREATH  *UNUSUAL BRUISING OR BLEEDING  TENDERNESS IN MOUTH AND THROAT WITH OR WITHOUT PRESENCE OF ULCERS  *URINARY PROBLEMS  *BOWEL PROBLEMS  UNUSUAL RASH Items with * indicate a potential emergency and should be followed up as soon as possible.  Feel free to call the clinic should you have any questions or concerns. The clinic phone number is [phone].  Please show the CHEMO ALERT CARD at check-in to the Emergency Department and triage nurse.   

## 2018-12-29 ENCOUNTER — Telehealth: Payer: Self-pay | Admitting: *Deleted

## 2018-12-29 ENCOUNTER — Ambulatory Visit
Admission: RE | Admit: 2018-12-29 | Discharge: 2018-12-29 | Disposition: A | Discharge status: Home / Self Care | Payer: Medicare Other | Source: Ambulatory Visit | Attending: Radiation Oncology | Admitting: Radiation Oncology

## 2018-12-29 ENCOUNTER — Other Ambulatory Visit: Payer: Self-pay

## 2018-12-29 DIAGNOSIS — C01 Malignant neoplasm of base of tongue: Secondary | ICD-10-CM | POA: Diagnosis not present

## 2018-12-29 DIAGNOSIS — Z510 Encounter for antineoplastic radiation therapy: Secondary | ICD-10-CM | POA: Diagnosis not present

## 2018-12-30 NOTE — Telephone Encounter (Signed)
Oncology Nurse Navigator Documentation  Mr. Millard called to report increased problem with PEG patency.  He indicated he now needs to exert substantial pressure with syringe plunger to instill supplement vs moderate pressure s/p last week's PEG adjustment.  He has tried adjusting body position including near full recline without benefit.  He stated he is still able to instill 5 cartons supplement and several bottles of water although requires increased time/effort.  He voiced understanding I will coordinate ASAP appt at [facility] IR for evaluation.  Gayleen Orem, RN, BSN Head & Neck Oncology Nurse [place] at [place] [phone]

## 2019-01-01 ENCOUNTER — Encounter: Payer: Self-pay | Admitting: *Deleted

## 2019-01-01 ENCOUNTER — Other Ambulatory Visit: Payer: Self-pay

## 2019-01-01 ENCOUNTER — Ambulatory Visit
Admission: RE | Admit: 2019-01-01 | Discharge: 2019-01-01 | Disposition: A | Discharge status: Home / Self Care | Payer: Medicare Other | Source: Ambulatory Visit | Attending: Radiation Oncology | Admitting: Radiation Oncology

## 2019-01-01 ENCOUNTER — Ambulatory Visit (HOSPITAL_COMMUNITY)
Admission: RE | Admit: 2019-01-01 | Discharge: 2019-01-01 | Disposition: A | Discharge status: Home / Self Care | Payer: Medicare Other | Source: Ambulatory Visit | Attending: Hematology | Admitting: Hematology

## 2019-01-01 ENCOUNTER — Encounter (HOSPITAL_COMMUNITY): Payer: Self-pay | Admitting: Interventional Radiology

## 2019-01-01 DIAGNOSIS — K9423 Gastrostomy malfunction: Secondary | ICD-10-CM | POA: Diagnosis not present

## 2019-01-01 DIAGNOSIS — Z431 Encounter for attention to gastrostomy: Secondary | ICD-10-CM | POA: Insufficient documentation

## 2019-01-01 DIAGNOSIS — C01 Malignant neoplasm of base of tongue: Secondary | ICD-10-CM

## 2019-01-01 DIAGNOSIS — Z510 Encounter for antineoplastic radiation therapy: Secondary | ICD-10-CM | POA: Diagnosis not present

## 2019-01-01 HISTORY — PX: IR REPLC GASTRO/COLONIC TUBE PERCUT W/FLUORO: IMG2333

## 2019-01-01 MED ORDER — SONAFINE EX EMUL
1.0000 "application " | Freq: Once | CUTANEOUS | Status: AC
  Administered 2019-01-01: 1 via TOPICAL

## 2019-01-01 MED ORDER — IOHEXOL 300 MG/ML  SOLN
50.0000 mL | Freq: Once | INTRAMUSCULAR | Status: AC | PRN
  Administered 2019-01-01: 15 mL

## 2019-01-01 NOTE — Procedures (Signed)
Interventional Radiology Procedure Note  Procedure:  Rescue of a displaced percutaneous gastrostomy tube, which was placed first week of September.   Hx: Complaint of increased resistance to usage, and possible occlusion vs displacement.  .  Complications: None  Recommendations:  - Ok to  use - Do not submerge - Routine care   Signed,  Dulcy Fanny. Earleen Newport, DO

## 2019-01-02 ENCOUNTER — Ambulatory Visit
Admission: RE | Admit: 2019-01-02 | Discharge: 2019-01-02 | Disposition: A | Discharge status: Home / Self Care | Payer: Medicare Other | Source: Ambulatory Visit | Attending: Radiation Oncology | Admitting: Radiation Oncology

## 2019-01-02 ENCOUNTER — Other Ambulatory Visit: Payer: Self-pay

## 2019-01-02 DIAGNOSIS — C01 Malignant neoplasm of base of tongue: Secondary | ICD-10-CM | POA: Diagnosis not present

## 2019-01-02 DIAGNOSIS — Z510 Encounter for antineoplastic radiation therapy: Secondary | ICD-10-CM | POA: Diagnosis not present

## 2019-01-03 ENCOUNTER — Ambulatory Visit
Admission: RE | Admit: 2019-01-03 | Discharge: 2019-01-03 | Disposition: A | Discharge status: Home / Self Care | Payer: Medicare Other | Source: Ambulatory Visit | Attending: Radiation Oncology | Admitting: Radiation Oncology

## 2019-01-03 ENCOUNTER — Other Ambulatory Visit: Payer: Self-pay

## 2019-01-03 ENCOUNTER — Inpatient Hospital Stay (HOSPITAL_BASED_OUTPATIENT_CLINIC_OR_DEPARTMENT_OTHER): Payer: Medicare Other | Admitting: Hematology

## 2019-01-03 ENCOUNTER — Encounter: Payer: Self-pay | Admitting: Radiation Oncology

## 2019-01-03 ENCOUNTER — Inpatient Hospital Stay: Payer: Medicare Other

## 2019-01-03 ENCOUNTER — Telehealth: Payer: Self-pay | Admitting: Hematology

## 2019-01-03 ENCOUNTER — Encounter: Payer: Self-pay | Admitting: Hematology

## 2019-01-03 ENCOUNTER — Encounter: Payer: Self-pay | Admitting: *Deleted

## 2019-01-03 VITALS — BP 129/74 | HR 58 | Temp 98.5°F | Resp 18 | Ht 70.0 in | Wt 231.7 lb

## 2019-01-03 DIAGNOSIS — N183 Chronic kidney disease, stage 3 unspecified: Secondary | ICD-10-CM | POA: Diagnosis not present

## 2019-01-03 DIAGNOSIS — Z5111 Encounter for antineoplastic chemotherapy: Secondary | ICD-10-CM | POA: Diagnosis not present

## 2019-01-03 DIAGNOSIS — K1231 Oral mucositis (ulcerative) due to antineoplastic therapy: Secondary | ICD-10-CM

## 2019-01-03 DIAGNOSIS — D6959 Other secondary thrombocytopenia: Secondary | ICD-10-CM | POA: Diagnosis not present

## 2019-01-03 DIAGNOSIS — R110 Nausea: Secondary | ICD-10-CM | POA: Diagnosis not present

## 2019-01-03 DIAGNOSIS — Z95828 Presence of other vascular implants and grafts: Secondary | ICD-10-CM

## 2019-01-03 DIAGNOSIS — C01 Malignant neoplasm of base of tongue: Secondary | ICD-10-CM

## 2019-01-03 DIAGNOSIS — Z510 Encounter for antineoplastic radiation therapy: Secondary | ICD-10-CM | POA: Diagnosis not present

## 2019-01-03 DIAGNOSIS — D701 Agranulocytosis secondary to cancer chemotherapy: Secondary | ICD-10-CM

## 2019-01-03 DIAGNOSIS — I4891 Unspecified atrial fibrillation: Secondary | ICD-10-CM | POA: Diagnosis not present

## 2019-01-03 DIAGNOSIS — T451X5A Adverse effect of antineoplastic and immunosuppressive drugs, initial encounter: Secondary | ICD-10-CM

## 2019-01-03 DIAGNOSIS — E46 Unspecified protein-calorie malnutrition: Secondary | ICD-10-CM

## 2019-01-03 LAB — BASIC METABOLIC PANEL - CANCER CENTER ONLY
Anion gap: 12 (ref 5–15)
BUN: 17 mg/dL (ref 8–23)
CO2: 25 mmol/L (ref 22–32)
Calcium: 8.7 mg/dL — ABNORMAL LOW (ref 8.9–10.3)
Chloride: 101 mmol/L (ref 98–111)
Creatinine: 0.99 mg/dL (ref 0.61–1.24)
GFR, Est AFR Am: >60 mL/min (ref >60)
GFR, Estimated: >60 mL/min (ref >60)
Glucose, Bld: 101 mg/dL — ABNORMAL HIGH (ref 70–99)
Potassium: 3.9 mmol/L (ref 3.5–5.1)
Sodium: 138 mmol/L (ref 135–145)

## 2019-01-03 LAB — CBC WITH DIFFERENTIAL (CANCER CENTER ONLY)
Abs Immature Granulocytes: 0.1 10*3/uL — ABNORMAL HIGH (ref 0.00–0.07)
Basophils Absolute: 0 10*3/uL (ref 0.0–0.1)
Basophils Relative: 1 %
Eosinophils Absolute: 0 10*3/uL (ref 0.0–0.5)
Eosinophils Relative: 1 %
HCT: 42.8 % (ref 39.0–52.0)
Hemoglobin: 14.7 g/dL (ref 13.0–17.0)
Immature Granulocytes: 3 %
Lymphocytes Relative: 3 %
Lymphs Abs: 0.1 10*3/uL — ABNORMAL LOW (ref 0.7–4.0)
MCH: 30.1 pg (ref 26.0–34.0)
MCHC: 34.3 g/dL (ref 30.0–36.0)
MCV: 87.7 fL (ref 80.0–100.0)
Monocytes Absolute: 0.4 10*3/uL (ref 0.1–1.0)
Monocytes Relative: 11 %
Neutro Abs: 2.8 10*3/uL (ref 1.7–7.7)
Neutrophils Relative %: 81 %
Platelet Count: 145 10*3/uL — ABNORMAL LOW (ref 150–400)
RBC: 4.88 MIL/uL (ref 4.22–5.81)
RDW: 15.7 % — ABNORMAL HIGH (ref 11.5–15.5)
WBC Count: 3.4 10*3/uL — ABNORMAL LOW (ref 4.0–10.5)
nRBC: 0 % (ref 0.0–0.2)

## 2019-01-03 MED ORDER — HEPARIN SOD (PORK) LOCK FLUSH 100 UNIT/ML IV SOLN
500.0000 [IU] | Freq: Once | INTRAVENOUS | Status: AC | PRN
  Administered 2019-01-03: 500 [IU]
  Filled 2019-01-03: qty 5

## 2019-01-03 MED ORDER — SODIUM CHLORIDE 0.9% FLUSH
10.0000 mL | INTRAVENOUS | Status: DC | PRN
  Administered 2019-01-03: 10 mL
  Filled 2019-01-03: qty 10

## 2019-01-03 NOTE — Telephone Encounter (Signed)
Gave patient avs report and appointments for November.  °

## 2019-01-03 NOTE — Patient Instructions (Signed)

## 2019-01-03 NOTE — Progress Notes (Signed)
[place] OFFICE PROGRESS NOTE  Patient Care Team: Bartholome Bill, MD as PCP - General (Family Medicine) O'Neal, Cassie Freer, MD as PCP - Cardiology (Cardiology) Eppie Gibson, MD as Attending Physician (Radiation Oncology) Leota Sauers, RN as Oncology Nurse Navigator Schinke, [place], [place] as Speech Language Pathologist (Speech Pathology) Karie Mainland, RD as Dietitian (Nutrition) Kennith Center, LCSW as Social Worker  HEME/ONC OVERVIEW: 1. 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 A-fib on Eliquis   2. 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, p16+ -S/p definitive chemoRT with weekly carboplatin x 6 doses   -Last treatment of radiation today  -Clinically, the right cervical LN appears overall stable in size, possibly modestly smaller; no evidence of disease progression  -We will tentatively repeat PET in 3-4 months after completing chemoRT to assess disease response -I counseled the patient on the importance of jaw and shoulder exercises to maintain mobility and range of motion -PRN antiemetics, Zofran, Compazine, dexamethasone, Ativan  Chemotherapy-associated thrombocytopenia -Secondary to chemotherapy -Plts 145k, improving -We will monitor for now; no indication for dose adjustment  Chemotherapy-associated leukopenia -Secondary to chemotherapy -WBC 3.4k with ANC ~2800, improving -Patient denies any  symptoms of infection -We will monitor for now  Chemotherapy-associated mucositis  -Secondary to the SCCa of the tongue and LN involvement -Unable to tolerate IR liquid morphine -Currently well controlled with oxycodone 5mg  q6hrs PRN for pain  -Continue the regimen above  Protein malnutrition -Secondary to chemoRT -Weight down by 6 lbs since the last visit -Currently taking ~4 Osmolite and several bottles of water  -I encouraged the patient to adhere to nutritional recommendations   Thick saliva -Secondary to radiation -Reasonably controlled with salt/soda rinses  -If thick saliva worsens in the future, we can consider adding guaifenesin  Stage II-III CKD -Baseline 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  All questions were answered. The patient knows to call the clinic with any problems, questions or concerns. No barriers to learning was detected.  Return in 2 weeks for labs, port flush and clinic appt.   [name], MD 01/03/2019 10:19 AM  CHIEF COMPLAINT: "I am doing okay"  INTERVAL HISTORY: Neil [name] returns to clinic for follow-up of squamous cell carcinoma of the right base of the tongue on definitive chemoradiation.  Patient is scheduled to complete the last treatment of radiation today.  He had his feeding tube exchanged on Monday due to malfunctioning.  Since the tube was replaced, he has been able to resume tube feeding without any difficulty, but he is currently only administering 4 bottles of Osmolite per day due to soreness around the feeding tube site.  He also has mild to moderate persistent throat soreness, for which he takes oxycodone approximately 3 times a day with adequate pain control.  He is compliant with salt/baking soda rinses.  His weight down is by about 6 pounds since last visit.  He has occasional diarrhea, but he has not had a bowel movement for the  past 3 days.  He denies any other complaint today.  REVIEW OF SYSTEMS:   Constitutional: ( - ) fevers, ( - )  chills , ( - ) night sweats Eyes: ( - ) blurriness of vision, ( - ) double vision, ( - ) watery eyes Ears, nose, mouth, throat, and face: ( - ) mucositis, ( + ) sore throat Respiratory: ( - ) cough, ( - ) dyspnea, ( - ) wheezes Cardiovascular: ( - ) palpitation, ( - ) chest discomfort, ( - ) lower extremity swelling Gastrointestinal:  ( - ) nausea, ( - ) heartburn, ( + ) change in bowel habits Skin: ( - ) abnormal skin rashes Lymphatics: ( - ) new lymphadenopathy, ( - ) easy bruising Neurological: ( - ) numbness, ( - ) tingling, ( - ) new weaknesses Behavioral/Psych: ( - ) mood change, ( - ) new changes  All other systems were reviewed with the patient and are negative.  SUMMARY OF ONCOLOGIC HISTORY: Oncology History  Cancer of base of tongue ([place])  07/29/2018 Imaging   Neck ultrasound: IMPRESSION: 16 x 30 mm solid mass in the right submandibular area. This may represent a submandibular mass or enlarged lymph node. Recommend CT neck with contrast for further evaluation.   08/03/2018 Imaging   CT neck (at [facility]): IMPRESSION: 1. 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 There is a single ipsilateral malignant lymph node measuring 3 cm. 2. Nodular thickening along the anterior right sublingual gland, attention on follow-up PET.   09/04/2018 Procedure   US-guided bx of the R submandibular mass   09/04/2018 Pathology Results   Accession: [id number]  Lymph node, needle/core biopsy, right submandibular - SQUAMOUS CELL CARCINOMA. - SEE MICROSCOPIC DESCRIPTION.   09/11/2018 Imaging   PET: IMPRESSION: Hypermetabolism along the right base of tongue, corresponding to the patient's known primary oropharyngeal cancer.   Ipsilateral level 2 cervical nodal metastasis.   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   11/16/2018 -  Chemotherapy   The patient had palonosetron (ALOXI) injection 0.25 mg, 0.25 mg, Intravenous,  Once, 6 of 6 cycles Administration: 0.25 mg (11/16/2018), 0.25 mg (11/23/2018), 0.25 mg (11/30/2018), 0.25 mg (12/07/2018), 0.25 mg (12/14/2018), 0.25 mg (12/28/2018) CARBOplatin (PARAPLATIN) 200 mg in sodium chloride 0.9 % 250 mL chemo infusion, 200 mg (100 % of original dose 202.8 mg), Intravenous,  Once, 6 of 6 cycles Dose modification: 202.8 mg (original dose 202.8 mg, Cycle 1), 260 mg (original dose 202.8 mg, Cycle 6, Reason: Provider Judgment) Administration: 200 mg (11/16/2018), 200 mg (11/23/2018), 260 mg (11/30/2018), 260 mg (12/07/2018), 260 mg (12/14/2018), 260 mg (12/28/2018)  for chemotherapy treatment.      I have reviewed the past medical history, past surgical history, social history and family history with the patient and they are unchanged from previous note.  ALLERGIES:  is allergic to lisinopril.  MEDICATIONS:  Current Outpatient Medications  Medication Sig Dispense Refill  . Apixaban (ELIQUIS PO) Take 5 mg by mouth 2 (two) times daily.     Marland Kitchen lidocaine-prilocaine (EMLA) cream Apply to affected area once 30 g 3  . metoprolol succinate (TOPROL-XL) 100 MG 24 hr tablet Take 1 tablet (100 mg total) by mouth daily. Take with or  immediately following a meal. 90 tablet 3  . Nutritional Supplements (FEEDING SUPPLEMENT, OSMOLITE 1.5 CAL,) LIQD 1 1/2 bottles Osmolite 1.5 via PEG with 60 mL water before and after each bolus feeding QID. 1 OZ Prosource or equivalent via PEG QID with 60 mL water flush after. Provides 2530 kcal, 129 gm Pro: >90% estimated needs. Please send formula, Prosource and supplies. 1422 mL 6  . omeprazole (PRILOSEC) 20 MG capsule Take 20 mg by mouth daily.    Marland Kitchen oxyCODONE (OXY IR/ROXICODONE) 5 MG immediate release tablet Take 1 tablet (5 mg total) by mouth every 6 (six)  hours as needed for severe pain. 60 tablet 0  . prochlorperazine (COMPAZINE) 10 MG tablet Take 1 tablet (10 mg total) by mouth every 6 (six) hours as needed (Nausea or vomiting). 30 tablet 1  . tamsulosin (FLOMAX) 0.4 MG CAPS capsule Take 0.4 mg by mouth daily after supper.    . dexamethasone (DECADRON) 4 MG tablet Take 2 tablets (8 mg total) by mouth daily. Start the day after chemotherapy for 2 days. Take with food. (Patient not taking: Reported on 12/27/2018) 30 tablet 1  . docusate sodium (COLACE) 100 MG capsule Take 100 mg by mouth as directed. Take 100 mg in AM,  200 mg in PM.    . hydrochlorothiazide (HYDRODIURIL) 25 MG tablet Take 1 tablet (25 mg total) by mouth daily. (Patient not taking: Reported on 12/27/2018) 90 tablet 3  . lidocaine (XYLOCAINE) 2 % solution Patient: Mix 1part 2% viscous lidocaine, 1part H20. Swish & swallow 39mL of diluted mixture, 49min before meals and at bedtime, up to QID (Patient not taking: Reported on 01/03/2019) 100 mL 5  . LORazepam (ATIVAN) 0.5 MG tablet Take 1 tablet (0.5 mg total) by mouth every 6 (six) hours as needed (Nausea or vomiting). (Patient not taking: Reported on 12/06/2018) 30 tablet 0  . ondansetron (ZOFRAN) 8 MG tablet Take 1 tablet (8 mg total) by mouth 2 (two) times daily as needed for refractory nausea / vomiting. Start on day 3 after chemotherapy. (Patient not taking: Reported on 12/06/2018) 30 tablet 1   No current facility-administered medications for this visit.     PHYSICAL EXAMINATION: ECOG PERFORMANCE STATUS: 2 - Symptomatic, <50% confined to bed  Today's Vitals   01/03/19 1005 01/03/19 1010  BP: 129/74   Pulse: (!) 58   Resp: 18   Temp: 98.5 F (36.9 C)   TempSrc: Temporal   SpO2: 99%   Weight: 231 lb 11.2 oz (105.1 kg)   Height: 5\' 10"  (1.778 m)   PainSc:  5    Body mass index is 33.25 kg/m.  Filed Weights   01/03/19 1005  Weight: 231 lb 11.2 oz (105.1 kg)    GENERAL: alert, no distress and comfortable SKIN:  Erythematous skin over bilateral neck from radiation induced skin irritation EYES: conjunctiva are pink and non-injected, sclera clear OROPHARYNX: no exudate, no erythema; lips, buccal mucosa, and tongue normal  NECK: supple, non-tender LYMPH: Approximately 1.5 x 1 cm right cervical LN, mobile, nontender, overall stable in size LUNGS: clear to auscultation with normal breathing effort HEART: regular rate & rhythm and no murmurs and no lower extremity edema ABDOMEN: soft, non-tender, non-distended, normal bowel sounds Musculoskeletal: no cyanosis of digits and no clubbing  PSYCH: alert & oriented x 3, fluent speech NEURO: no focal motor/sensory deficits  LABORATORY DATA:  I have reviewed the data as listed    Component Value Date/Time   NA 139 12/27/2018 1040  K 4.0 12/27/2018 1040   CL 103 12/27/2018 1040   CO2 24 12/27/2018 1040   GLUCOSE 95 12/27/2018 1040   BUN 17 12/27/2018 1040   CREATININE 1.00 12/27/2018 1040   CALCIUM 8.5 (L) 12/27/2018 1040   PROT 6.6 10/18/2018 0846   ALBUMIN 3.9 10/18/2018 0846   AST 21 10/18/2018 0846   ALT 19 10/18/2018 0846   ALKPHOS 39 10/18/2018 0846   BILITOT 0.6 10/18/2018 0846   GFRNONAA >60 12/27/2018 1040   GFRAA >60 12/27/2018 1040    No results found for: SPEP, UPEP  Lab Results  Component Value Date   WBC 3.4 (L) 01/03/2019   NEUTROABS 2.8 01/03/2019   HGB 14.7 01/03/2019   HCT 42.8 01/03/2019   MCV 87.7 01/03/2019   PLT 145 (L) 01/03/2019      Chemistry      Component Value Date/Time   NA 139 12/27/2018 1040   K 4.0 12/27/2018 1040   CL 103 12/27/2018 1040   CO2 24 12/27/2018 1040   BUN 17 12/27/2018 1040   CREATININE 1.00 12/27/2018 1040      Component Value Date/Time   CALCIUM 8.5 (L) 12/27/2018 1040   ALKPHOS 39 10/18/2018 0846   AST 21 10/18/2018 0846   ALT 19 10/18/2018 0846   BILITOT 0.6 10/18/2018 0846       RADIOGRAPHIC STUDIES: I have personally reviewed the radiological images as listed below and  agreed with the findings in the report. Ir Replc Gastro/colonic Tube Percut W/fluoro  Result Date: 01/01/2019 INDICATION: 72 year old male with a history of head neck carcinoma, with percutaneous gastrostomy placed 11/07/2018. Increasing resistance to usage has been encountered with concern for either occluded tube or displacement. EXAM: RESCUE AND REPLACEMENT OF PERCUTANEOUS GASTROSTOMY MEDICATIONS: None ANESTHESIA/SEDATION: None CONTRAST:  15 cc-administered into the gastric lumen. FLUOROSCOPY TIME:  Fluoroscopy Time: 3 minutes 6 seconds COMPLICATIONS: None PROCEDURE: Informed written consent was obtained from the patient after a thorough discussion of the procedural risks, benefits and alternatives. All questions were addressed. Maximal Sterile Barrier Technique was utilized including caps, mask, sterile gowns, sterile gloves, sterile drape, hand hygiene and skin antiseptic. A timeout was performed prior to the initiation of the procedure. Initial images with injection of the tube demonstrated that the tube has been withdrawn from the lumen with a small tract remaining connecting the lumen of the tube with the lumen of the stomach. Removal of the gastrostomy tube was performed with an attempt at placing a new 36 French balloon retention gastrostomy. Resistance was encountered. A Kumpe the catheter was then navigated through the ostomy site into the lumen of the stomach using navigation with contrast and manipulation under fluoroscopy. Amplatz wire was placed into the stomach and the Kumpe the was removed. Eighteen [place] and then 20 [place] dilation of the tract performed. Ultimately a new 67 French balloon retention into it percutaneous gastrostomy was placed with a coaxial 35 cm 8 [place] introducer from a sheath set. 8 cc of saline was used to inflate the balloon, and contrast confirmed location within the stomach. Final images were stored. Sterile bandage was placed. Patient tolerated the procedure well and  remained hemodynamically stable throughout. No complications were encountered and no significant blood loss. IMPRESSION: Status post placement of a new 30 French balloon retention gastrostomy tube as a rescue for a displaced pull-through gastrostomy tube. Signed, Dulcy Fanny. Dellia Nims, RPVI [facility] Electronically Signed   By: Corrie Mckusick D.O.   On:  01/01/2019 11:05   

## 2019-01-08 NOTE — Progress Notes (Signed)
Oncology Nurse Navigator Documentation  Met with Mr. Cuartas prior to XRT, explained I will escort him to [facility] IR for PEG evaluation s/p SRT and weekly PUT with Dr. Isidore Moos. I accompanied him during evaluation and subsequent tube replacement, he tolerated procedure wo/ difficulty. Escorted him to [employer] where he waited for ride home.  Gayleen Orem, RN, BSN Head & Neck Oncology Nurse [place] at [place] [phone]

## 2019-01-08 NOTE — Progress Notes (Signed)
Oncology Nurse Navigator Documentation  Met with Neil [name] during Est Pt appt with Dr. Maylon Peppers.  He completed RT earlier this morning. He reported new PEG placed Monday working well. He voiced understanding he will RTC to see Dr. Maylon Peppers in a couple of weeks. Provided verbal/written post-RT guidance:  Importance of keeping all follow-up appts, especially those with Nutrition and SLP.  Importance of protecting treatment area from sun.  Continuation of Sonafine application 2-3 times daily, application of abx ointment to areas of raw skin; when supply of Sonafine exhausted transition to OTC lotion with vitamin E. Discussed timeframe of restaging PET. Explained my role as navigator will continue for several more months, I will be calling or joining him during follow-up visits.   He agreed to call me with needs/concerns.    Gayleen Orem, RN, BSN Head & Neck Oncology [place] at [place] [phone]

## 2019-01-11 ENCOUNTER — Telehealth: Payer: Self-pay | Admitting: *Deleted

## 2019-01-11 ENCOUNTER — Inpatient Hospital Stay: Payer: Medicare Other | Attending: Hematology | Admitting: Nutrition

## 2019-01-11 DIAGNOSIS — I4891 Unspecified atrial fibrillation: Secondary | ICD-10-CM | POA: Insufficient documentation

## 2019-01-11 DIAGNOSIS — R42 Dizziness and giddiness: Secondary | ICD-10-CM | POA: Insufficient documentation

## 2019-01-11 DIAGNOSIS — Z452 Encounter for adjustment and management of vascular access device: Secondary | ICD-10-CM | POA: Insufficient documentation

## 2019-01-11 DIAGNOSIS — C01 Malignant neoplasm of base of tongue: Secondary | ICD-10-CM | POA: Insufficient documentation

## 2019-01-11 DIAGNOSIS — N183 Chronic kidney disease, stage 3 unspecified: Secondary | ICD-10-CM | POA: Insufficient documentation

## 2019-01-11 DIAGNOSIS — B370 Candidal stomatitis: Secondary | ICD-10-CM | POA: Insufficient documentation

## 2019-01-11 DIAGNOSIS — Z7901 Long term (current) use of anticoagulants: Secondary | ICD-10-CM | POA: Insufficient documentation

## 2019-01-11 DIAGNOSIS — E876 Hypokalemia: Secondary | ICD-10-CM | POA: Insufficient documentation

## 2019-01-11 DIAGNOSIS — E46 Unspecified protein-calorie malnutrition: Secondary | ICD-10-CM | POA: Insufficient documentation

## 2019-01-11 NOTE — Telephone Encounter (Signed)
Oncology Nurse Navigator Documentation  Called patient to check on well being s/p completion of RT last week Wed.  He reported:  "Doing pretty well."  Has been experiencing "scarcely noticeable" throat pain for which was taking oxycodone 5mg .  He became constipated for 3 days, had BM yesterday s/p dose of Milk of Magnesia.  He has decided to stop taking oxy.  Difficulty with PEG patency again.  Gravity instillation not working, necessary to use plunger with syringe.  He stated there is minimal resistance when using plunger.  He agreed to have me assess when he comes in next Wed to see Drs. Tillie Fantasia.  He denied specific/urgent needs, agreed to call me if this changes prior to next [facility] appts.  Gayleen Orem, RN, BSN Head & Neck Oncology Nurse [place] at [place] [phone]

## 2019-01-12 NOTE — Progress Notes (Signed)
Nutrition follow-up completed with patient over the telephone. Patient has completed treatment for tongue cancer. Last weight documented was 231.7 pounds on October 28 decreased from 237.9 pounds October 21. Patient reported that this past week was awful.  He has been very tired. He stopped taking oxycodone secondary to constipation.  He is now taking some milk of magnesia. He is tolerating 5 bottles of Osmolite 1.5.  He reports he is going to try to start his protein supplements.  He has noticed a lot of muscle loss.  Estimated nutrition needs: 2440-2640 cal, 130-150 g protein, 2.6 L fluid.  Nutrition diagnosis: Inadequate oral intake continues.  Intervention: Patient educated to increase Osmolite 1.5-6 bottles daily. Resume protein supplements and assess tolerance. Improve bowel regimen. Try to increase activity as tolerated.  Monitoring, evaluation, goals: Patient will tolerate tube feeding and oral intake to minimize further weight loss and promote healing.  Next visit: November 19 in person.  **Disclaimer: This note was dictated with voice recognition software. Similar sounding words can inadvertently be transcribed and this note may contain transcription errors which may not have been corrected upon publication of note.**

## 2019-01-15 ENCOUNTER — Telehealth: Payer: Self-pay | Admitting: *Deleted

## 2019-01-15 ENCOUNTER — Other Ambulatory Visit: Payer: Self-pay | Admitting: *Deleted

## 2019-01-15 ENCOUNTER — Other Ambulatory Visit (HOSPITAL_COMMUNITY): Payer: Medicare Other

## 2019-01-15 DIAGNOSIS — C01 Malignant neoplasm of base of tongue: Secondary | ICD-10-CM

## 2019-01-15 NOTE — Telephone Encounter (Signed)
Oncology Nurse Navigator Documentation  Rec'd call from Mr. Langa.  He reported leakage of Osmolite and water at PEG insertion site during instillation (which has been requiring use of plunger as reported last week).  S/p conversation with [facility] IR, I called Mr. Ulep and instructed him to cease using PEG pending evaluation tomorrow morning.  He agreed to arrive to [facility] by 10:30.    Order placed for IR evaluation.  Gayleen Orem, RN, BSN Head & Neck Oncology Nurse [place] at [place] [phone]

## 2019-01-15 NOTE — Progress Notes (Signed)
Mr. Akina presents for follow up of radiation completed 01/03/19 to his base of tongue and bilateral neck nodes.    Pain issues, if any: He denies. He has not taken oxycodone since 01/09/19. He does report pain when yawning Using a feeding tube?: Yes, replaced due to difficulty instilling supplements.  Weight changes, if any: He has not been able to use his feeding tube for several days due to difficulty with his tube. Wt Readings from Last 3 Encounters:  01/17/19 229 lb 2 oz (103.9 kg)  01/03/19 231 lb 11.2 oz (105.1 kg)  12/27/18 237 lb 14.4 oz (107.9 kg)   Swallowing issues, if any: He is swallowing liquids. He has tried different soft foods, but is not able to eat due to taste changes.  Smoking or chewing tobacco? No Using fluoride trays daily? No Last ENT visit was on: Not since diagnosis.  Skin: His skin has healed well. He continues to use sonafine and will switch to a vitamin E containing lotion when the sonafine is completed.  Other notable issues, if any:  He reports thick saliva which causes him to cough at times.  Dr. Maylon Peppers today.  BP 113/71 (BP Location: Left Arm, Patient Position: Sitting)   Pulse 61   Temp 98.2 F (36.8 C) (Temporal)   Resp 18   Ht 5\' 10"  (1.778 m)   Wt 229 lb 2 oz (103.9 kg)   SpO2 100%   BMI 32.88 kg/m

## 2019-01-16 ENCOUNTER — Ambulatory Visit (HOSPITAL_COMMUNITY)
Admission: RE | Admit: 2019-01-16 | Discharge: 2019-01-16 | Disposition: A | Discharge status: Home / Self Care | Payer: Medicare Other | Source: Ambulatory Visit | Attending: Hematology | Admitting: Hematology

## 2019-01-16 ENCOUNTER — Other Ambulatory Visit (HOSPITAL_COMMUNITY): Payer: Self-pay | Admitting: Hematology

## 2019-01-16 ENCOUNTER — Other Ambulatory Visit: Payer: Self-pay

## 2019-01-16 DIAGNOSIS — K9423 Gastrostomy malfunction: Secondary | ICD-10-CM | POA: Diagnosis not present

## 2019-01-16 DIAGNOSIS — Z431 Encounter for attention to gastrostomy: Secondary | ICD-10-CM | POA: Diagnosis not present

## 2019-01-16 DIAGNOSIS — C01 Malignant neoplasm of base of tongue: Secondary | ICD-10-CM | POA: Diagnosis not present

## 2019-01-16 MED ORDER — IOHEXOL 300 MG/ML  SOLN
50.0000 mL | Freq: Once | INTRAMUSCULAR | Status: AC | PRN
  Administered 2019-01-16: 11:00:00 40 mL

## 2019-01-16 MED ORDER — LIDOCAINE VISCOUS HCL 2 % MT SOLN
OROMUCOSAL | Status: AC
  Filled 2019-01-16: qty 15

## 2019-01-16 NOTE — Procedures (Signed)
Pre procedural Dx: Dysphagia, poorly functioning feeding tube. Post procedural Dx: Same  Successful fluoroscopic guided replacement and upsizing of now 22 Fr gastrostomy tube.   The feeding tube is ready for immediate use.  EBL: None Complications: None immediate.  PLAN:  Patient was instructed to maintain the external disc at the approximately 8 cm marker as patient's large abdominal pannus is likely contributing to inadvertent retraction of the gastrostomy tube outside the gastric lumen.   Ronny Bacon, MD Pager #: [phone]

## 2019-01-17 ENCOUNTER — Other Ambulatory Visit: Payer: Self-pay

## 2019-01-17 ENCOUNTER — Inpatient Hospital Stay (HOSPITAL_BASED_OUTPATIENT_CLINIC_OR_DEPARTMENT_OTHER): Payer: Medicare Other | Admitting: Hematology

## 2019-01-17 ENCOUNTER — Ambulatory Visit
Admission: RE | Admit: 2019-01-17 | Discharge: 2019-01-17 | Disposition: A | Discharge status: Home / Self Care | Payer: Medicare Other | Source: Ambulatory Visit | Attending: Radiation Oncology | Admitting: Radiation Oncology

## 2019-01-17 ENCOUNTER — Inpatient Hospital Stay: Payer: Medicare Other

## 2019-01-17 ENCOUNTER — Encounter: Payer: Self-pay | Admitting: Radiation Oncology

## 2019-01-17 ENCOUNTER — Encounter: Payer: Self-pay | Admitting: Hematology

## 2019-01-17 ENCOUNTER — Encounter: Payer: Self-pay | Admitting: *Deleted

## 2019-01-17 VITALS — BP 126/76 | HR 77 | Temp 98.2°F | Resp 17 | Ht 70.0 in | Wt 228.5 lb

## 2019-01-17 DIAGNOSIS — C01 Malignant neoplasm of base of tongue: Secondary | ICD-10-CM

## 2019-01-17 DIAGNOSIS — R42 Dizziness and giddiness: Secondary | ICD-10-CM | POA: Diagnosis not present

## 2019-01-17 DIAGNOSIS — Z79899 Other long term (current) drug therapy: Secondary | ICD-10-CM | POA: Insufficient documentation

## 2019-01-17 DIAGNOSIS — Z888 Allergy status to other drugs, medicaments and biological substances status: Secondary | ICD-10-CM | POA: Insufficient documentation

## 2019-01-17 DIAGNOSIS — N183 Chronic kidney disease, stage 3 unspecified: Secondary | ICD-10-CM

## 2019-01-17 DIAGNOSIS — Z431 Encounter for attention to gastrostomy: Secondary | ICD-10-CM | POA: Diagnosis not present

## 2019-01-17 DIAGNOSIS — E876 Hypokalemia: Secondary | ICD-10-CM | POA: Diagnosis not present

## 2019-01-17 DIAGNOSIS — E46 Unspecified protein-calorie malnutrition: Secondary | ICD-10-CM | POA: Diagnosis not present

## 2019-01-17 DIAGNOSIS — I4891 Unspecified atrial fibrillation: Secondary | ICD-10-CM | POA: Diagnosis not present

## 2019-01-17 DIAGNOSIS — B370 Candidal stomatitis: Secondary | ICD-10-CM | POA: Diagnosis not present

## 2019-01-17 DIAGNOSIS — Z95828 Presence of other vascular implants and grafts: Secondary | ICD-10-CM

## 2019-01-17 DIAGNOSIS — Z7901 Long term (current) use of anticoagulants: Secondary | ICD-10-CM | POA: Diagnosis not present

## 2019-01-17 DIAGNOSIS — Z452 Encounter for adjustment and management of vascular access device: Secondary | ICD-10-CM | POA: Diagnosis not present

## 2019-01-17 DIAGNOSIS — Z923 Personal history of irradiation: Secondary | ICD-10-CM | POA: Diagnosis not present

## 2019-01-17 DIAGNOSIS — E0789 Other specified disorders of thyroid: Secondary | ICD-10-CM | POA: Diagnosis not present

## 2019-01-17 LAB — CBC WITH DIFFERENTIAL (CANCER CENTER ONLY)
Abs Immature Granulocytes: 0.07 10*3/uL (ref 0.00–0.07)
Basophils Absolute: 0 10*3/uL (ref 0.0–0.1)
Basophils Relative: 1 %
Eosinophils Absolute: 0 10*3/uL (ref 0.0–0.5)
Eosinophils Relative: 1 %
HCT: 39.9 % (ref 39.0–52.0)
Hemoglobin: 13.6 g/dL (ref 13.0–17.0)
Immature Granulocytes: 2 %
Lymphocytes Relative: 5 %
Lymphs Abs: 0.2 10*3/uL — ABNORMAL LOW (ref 0.7–4.0)
MCH: 30.6 pg (ref 26.0–34.0)
MCHC: 34.1 g/dL (ref 30.0–36.0)
MCV: 89.9 fL (ref 80.0–100.0)
Monocytes Absolute: 0.7 10*3/uL (ref 0.1–1.0)
Monocytes Relative: 17 %
Neutro Abs: 3.1 10*3/uL (ref 1.7–7.7)
Neutrophils Relative %: 74 %
Platelet Count: 180 10*3/uL (ref 150–400)
RBC: 4.44 MIL/uL (ref 4.22–5.81)
RDW: 16.4 % — ABNORMAL HIGH (ref 11.5–15.5)
WBC Count: 4.1 10*3/uL (ref 4.0–10.5)
nRBC: 0 % (ref 0.0–0.2)

## 2019-01-17 LAB — BASIC METABOLIC PANEL - CANCER CENTER ONLY
Anion gap: 11 (ref 5–15)
BUN: 18 mg/dL (ref 8–23)
CO2: 26 mmol/L (ref 22–32)
Calcium: 8.6 mg/dL — ABNORMAL LOW (ref 8.9–10.3)
Chloride: 100 mmol/L (ref 98–111)
Creatinine: 1.02 mg/dL (ref 0.61–1.24)
GFR, Est AFR Am: >60 mL/min (ref >60)
GFR, Estimated: >60 mL/min (ref >60)
Glucose, Bld: 91 mg/dL (ref 70–99)
Potassium: 3.3 mmol/L — ABNORMAL LOW (ref 3.5–5.1)
Sodium: 137 mmol/L (ref 135–145)

## 2019-01-17 MED ORDER — SODIUM CHLORIDE 0.9% FLUSH
10.0000 mL | INTRAVENOUS | Status: DC | PRN
  Administered 2019-01-17: 10 mL
  Filled 2019-01-17: qty 10

## 2019-01-17 MED ORDER — POTASSIUM CHLORIDE CRYS ER 20 MEQ PO TBCR: 20.0000 meq | EXTENDED_RELEASE_TABLET | Freq: Every day | ORAL | 0 refills | Status: DC

## 2019-01-17 MED ORDER — NYSTATIN 100000 UNIT/ML MT SUSP: 5.0000 mL | Freq: Four times a day (QID) | OROMUCOSAL | 0 refills | Status: AC

## 2019-01-17 MED ORDER — HEPARIN SOD (PORK) LOCK FLUSH 100 UNIT/ML IV SOLN
500.0000 [IU] | Freq: Once | INTRAVENOUS | Status: AC | PRN
  Administered 2019-01-17: 500 [IU]
  Filled 2019-01-17: qty 5

## 2019-01-17 NOTE — Progress Notes (Signed)
Radiation Oncology         [phone] ________________________________  Name: Neil [name] MRN: [id number]  Date: 01/17/2019  DOB: [birth year]  Follow-Up Visit Note in person  CC: Verlon Au, MD  Billy Fischer MD  Diagnosis and Prior Radiotherapy:       ICD-10-CM   1. 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 Mr. Boeger presents for follow up of radiation completed 01/03/19 to his base of tongue and bilateral neck nodes.   Pain issues, if any: He denies. He has not taken oxycodone since 01/09/19. He does report pain when yawning Using a feeding tube?: Yes, replaced due to difficulty instilling supplements.  Doing better now.  Weight changes, if any: He has not been able to use his feeding tube for several days due to difficulty with his tube. Wt Readings from Last 3 Encounters:  01/17/19 229 lb 2 oz (103.9 kg)  01/03/19 231 lb 11.2 oz (105.1 kg)  12/27/18 237 lb 14.4 oz (107.9 kg)   Swallowing issues, if any: He is swallowing liquids. He has tried different soft foods, but is not able to eat due to taste changes.  Smoking or chewing tobacco? No Using fluoride trays daily? No Last ENT visit was on: Not since diagnosis.  Skin: His skin has healed well. He continues to use sonafine and will switch to a vitamin E containing lotion when the sonafine is completed.  Other notable issues, if any:  He reports thick saliva which causes him to cough at times.  Dr. Dion Body today.  ALLERGIES:  is allergic to lisinopril.  Meds: Current Outpatient Medications  Medication Sig Dispense Refill   Apixaban (ELIQUIS PO) Take 5 mg by mouth 2 (two) times daily.      metoprolol succinate (TOPROL-XL) 100 MG 24 hr tablet Take 1 tablet (100 mg total) by mouth daily. Take with or immediately following a meal. 90 tablet 3   Nutritional Supplements  (FEEDING SUPPLEMENT, OSMOLITE 1.5 CAL,) LIQD 1 1/2 bottles Osmolite 1.5 via PEG with 60 mL water before and after each bolus feeding QID. 1 OZ Prosource or equivalent via PEG QID with 60 mL water flush after. Provides 2530 kcal, 129 gm Pro: >90% estimated needs. Please send formula, Prosource and supplies. 1422 mL 6   omeprazole (PRILOSEC) 20 MG capsule Take 20 mg by mouth daily.     tamsulosin (FLOMAX) 0.4 MG CAPS capsule Take 0.4 mg by mouth daily after supper.     nystatin (MYCOSTATIN) 100000 UNIT/ML suspension Take 5 mLs (500,000 Units total) by mouth 4 (four) times daily for 7 days. 120 mL 0   potassium chloride SA (KLOR-CON) 20 MEQ tablet Take 1 tablet (20 mEq total) by mouth daily for 5 days. 5 tablet 0   No current facility-administered medications for this encounter.     Physical Findings: The patient is in no acute distress. Patient is alert and oriented. Wt Readings from Last 3 Encounters:  01/17/19 229 lb 2 oz (103.9 kg)  01/17/19 228 lb 8 oz (103.6 kg)  01/03/19 231 lb 11.2 oz (105.1 kg)    height is 5\' 10"  (1.778 m) and weight is 229 lb 2 oz (103.9 kg). His temporal temperature is 98.2 F (36.8 C). His blood pressure is 113/71 and his pulse is 61. His respiration is 18 and oxygen saturation is  100%. .  General: Alert and oriented, in no acute distress HEENT: Head is normocephalic. Extraocular movements are intact. Oropharynx is notable for thick mucus, no lesions visible, no thrush Neck: Neck is notable for healing skin Skin: Skin in treatment fields shows satisfactory healing  Psychiatric: Judgment and insight are intact. Affect is appropriate.   Lab Findings: Lab Results  Component Value Date   WBC 4.1 01/17/2019   HGB 13.6 01/17/2019   HCT 39.9 01/17/2019   MCV 89.9 01/17/2019   PLT 180 01/17/2019    Lab Results  Component Value Date   TSH 2.641 10/18/2018    Radiographic Findings: Ir Replc Gastro/colonic Tube Percut W/fluoro  Result Date:  01/01/2019 INDICATION: 73 year old male with a history of head neck carcinoma, with percutaneous gastrostomy placed 11/07/2018. Increasing resistance to usage has been encountered with concern for either occluded tube or displacement. EXAM: RESCUE AND REPLACEMENT OF PERCUTANEOUS GASTROSTOMY MEDICATIONS: None ANESTHESIA/SEDATION: None CONTRAST:  15 cc-administered into the gastric lumen. FLUOROSCOPY TIME:  Fluoroscopy Time: 3 minutes 6 seconds COMPLICATIONS: None PROCEDURE: Informed written consent was obtained from the patient after a thorough discussion of the procedural risks, benefits and alternatives. All questions were addressed. Maximal Sterile Barrier Technique was utilized including caps, mask, sterile gowns, sterile gloves, sterile drape, hand hygiene and skin antiseptic. A timeout was performed prior to the initiation of the procedure. Initial images with injection of the tube demonstrated that the tube has been withdrawn from the lumen with a small tract remaining connecting the lumen of the tube with the lumen of the stomach. Removal of the gastrostomy tube was performed with an attempt at placing a new 20 French balloon retention gastrostomy. Resistance was encountered. A Kumpe the catheter was then navigated through the ostomy site into the lumen of the stomach using navigation with contrast and manipulation under fluoroscopy. Amplatz wire was placed into the stomach and the Kumpe the was removed. Eighteen [place] and then 20 [place] dilation of the tract performed. Ultimately a new 20 French balloon retention into it percutaneous gastrostomy was placed with a coaxial 35 cm 8 [place] introducer from a sheath set. 8 cc of saline was used to inflate the balloon, and contrast confirmed location within the stomach. Final images were stored. Sterile bandage was placed. Patient tolerated the procedure well and remained hemodynamically stable throughout. No complications were encountered and no significant  blood loss. IMPRESSION: Status post placement of a new 20 French balloon retention gastrostomy tube as a rescue for a displaced pull-through gastrostomy tube. Signed, Yvone Neu. Reyne Dumas, RPVI [facility] Radiology Electronically Signed   By: Gilmer Mor D.O.   On: 01/01/2019 11:05   Ir Replace G-tube Complex Wo Fluoro (tract Rev)  Result Date: 01/16/2019 INDICATION: Patient with history of head neck cancer, pull-through gastrostomy tube placement on 11/07/2018 which subsequently pulled into the ventral abdominal wall requiring fluoroscopic guided replacement on 01/01/2019 Patient now presents for evaluation with complaint persistent leaking about the gastrostomy tube. EXAM: FLUOROSCOPIC GUIDED REPLACEMENT OF GASTROSTOMY TUBE COMPARISON:  Pull-through gastrostomy tube placement-11/07/2018; fluoroscopic guided gastrostomy tube exchange-01/01/2019 MEDICATIONS: None. CONTRAST:  40mL OMNIPAQUE IOHEXOL 300 MG/ML SOLN administered into the gastric lumen FLUOROSCOPY TIME:  1 minutes, 54 seconds (114.3 mGy) COMPLICATIONS: None immediate. PROCEDURE: Informed written consent was obtained from the patient after a discussion of the risks, benefits and alternatives to treatment. Questions regarding the procedure were encouraged and answered. A timeout was performed prior to the initiation of the procedure. The upper  abdomen and external portion of the existing gastrostomy tube was prepped and draped in the usual sterile fashion, and a sterile drape was applied covering the operative field. Maximum barrier sterile technique with sterile gowns and gloves were used for the procedure. A timeout was performed prior to the initiation of the procedure. Contrast was injected via the existing 20 French balloon retention gastrostomy tube demonstrating opacification the gastric lumen however a once again appeared the balloon retention head retracted outside the gastric lumen. As such,  the external portion of the existing gastrostomy tube was cut and cannulated with a short Amplatz wire which was coiled within gastric lumen. Under intermittent fluoroscopic guidance, the existing 20 French gastrostomy tube was removed, the track was serially dilated ultimately a new slightly larger now 22 [place] gastrostomy tube was advanced through the gastrostomy track into the gastric lumen. The retention balloon was insufflated with approximately 10 cc of saline and dilute contrast. Fluoroscopic image was saved demonstrated easy mobility of the inflated gastrostomy balloon within the stomach lumen confirming appropriate positioning. Next, the balloon was pulled against the ventral aspect of the inter gastric lumen and the external disc was cinched at the approximately 8 cm marker. Contrast was injected and several spot fluoroscopic images were obtained in various obliquities to confirm appropriate intraluminal positioning. A dressing was placed. The patient tolerated the procedure well without immediate postprocedural complication. IMPRESSION: Successful fluoroscopic guided exchange, repositioning and up sizing new 22-French gastrostomy tube. The new gastrostomy tube is ready for immediate use. PLAN: Patient was instructed to maintain the external disc at the approximately 8 cm marker as patient's large abdominal pannus is likely contributing to inadvertent retraction of the gastrostomy tube outside the gastric lumen. Electronically Signed   By: Simonne Come M.D.   On: 01/16/2019 15:07    Impression/Plan:    1) Head and Neck Cancer Status: Healing from chemoradiotherapy  2) Nutritional Status: Relatively stable, having issues with his feeding tube.  This was replaced yesterday.  We talked in detail about nutrition PEG tube: Present  3) Risk Factors: The patient has been educated about risk factors including alcohol and tobacco abuse; they understand that avoidance of alcohol and tobacco is important to  prevent recurrences as well as other cancers  4) Swallowing: Functional  5) Dental - He is edentulous  6) Thyroid function: Check annually Lab Results  Component Value Date   TSH 2.641 10/18/2018    7) Other: He has substantial taste changes but has been able to appreciate the taste of coffee  8) Follow-up in 3 months with PET scan. The patient was encouraged to call with any issues or questions before then.  I spent 15 minutes minutes face to face with the patient and more than 50% of that time was spent in counseling and/or coordination of care. _____________________________________   Lonie Peak, MD  This document serves as a record of services personally performed by Lonie Peak, MD. It was created on her behalf by Mickie Bail, a trained medical scribe. The creation of this record is based on the scribe's personal observations and the provider's statements to them. This document has been checked and approved by the attending provider.

## 2019-01-17 NOTE — Progress Notes (Signed)
[name] OFFICE PROGRESS NOTE  Patient Care Team: Bartholome Bill, MD as PCP - General (Family Medicine) O'Neal, Cassie Freer, MD as PCP - Cardiology (Cardiology) Eppie Gibson, MD as Attending Physician (Radiation Oncology) Leota Sauers, RN as Oncology Nurse Navigator Schinke, Perry Mount, [place] as Speech Language Pathologist (Speech Pathology) Karie Mainland, RD as Dietitian (Nutrition) Kennith Center, LCSW as Social Worker  HEME/ONC OVERVIEW: 1. 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 A-fib on Eliquis   2. 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, p16+ -S/p definitive chemoRT with weekly carboplatin x 6 doses    -Clinically, the right cervical LN appears overall stable/modestly improving in size; no evidence of disease progression  -We will tentatively repeat PET in 3-4 months after completing chemoRT to assess disease response -I counseled the patient on the importance of jaw and shoulder exercises to maintain mobility and range of motion -Periodic TSH monitoring for any evidence of radiation-induced thyroid dysfunction   Oral candidiasis -New, mild white plaques over the tongue -I have prescribed nystatin swish and swallow x 7 days -I also encouraged the patient to maintain adequate oral hygiene   Hypokalemia -K 3.3 today, mildly low -Patient is asymptomatic -I have prescribed KCl 52mEq daily x 5 days, and  instructed him to follow up with PCP for further management  Protein malnutrition -Secondary to chemoRT -Weight stable since the last visit; feeding tube had to be replaced several times due to it falling out  -Currently taking ~6 Osmolite and several bottles of water per day -I encouraged the patient to adhere to nutritional recommendations   Dizziness -Possibly due to mild dehydration vs orthostatic hypotension -Patient has been able to maintain adequate amount of fluids since the feeding tube was replaced -He is currently taking metoprolol 100 mg daily, which may contribute to some lightheadedness -I instructed patient to contact his cardiologist for dose adjustment as needed -I also counseled the patient on taking his time when changing body position to avoid falls  Stage II-III CKD -Baseline Cr ~1.2 -Cr 1.02 today, stable; electrolytes normal -We will monitor it closely   No orders of the defined types were placed in this encounter.  All questions were answered. The patient knows to call the clinic with any problems, questions or concerns. No barriers to learning was detected.  Return in 4-5 weeks for labs, port flush and clinic appt.   [name], MD 01/17/2019 1:38 PM  CHIEF COMPLAINT: "My feeding tube keeps coming out"  INTERVAL HISTORY: Neil [name] returns clinic for follow-up of squamous cell carcinoma of R BOT s/p definitive chemoradiation.  Patient reports that he had to have his feeding tube exchanged a third time due to the following out.  He has not been pulling on the tube or applying excessive force.  Since the feeding tube was replaced, he has been able to resume adequate nutritional intake, including Osmolite and water.  He is currently taking 6  Osmolite via the feeding tube per day.  He reports mild lightheadedness, usually with body position change, further past a few days, but he denies any falls or syncope.  He currently takes metoprolol 100 mg daily.  He denies any  chest pain, diaphoresis, or shortness of breath.  He denies any other complaint today.  REVIEW OF SYSTEMS:   Constitutional: ( - ) fevers, ( - )  chills , ( - ) night sweats Eyes: ( - ) blurriness of vision, ( - ) double vision, ( - ) watery eyes Ears, nose, mouth, throat, and face: ( - ) mucositis, ( - ) sore throat Respiratory: ( - ) cough, ( - ) dyspnea, ( - ) wheezes Cardiovascular: ( - ) palpitation, ( - ) chest discomfort, ( - ) lower extremity swelling Gastrointestinal:  ( - ) nausea, ( - ) heartburn, ( - ) change in bowel habits Skin: ( - ) abnormal skin rashes Lymphatics: ( - ) new lymphadenopathy, ( - ) easy bruising Neurological: ( - ) numbness, ( - ) tingling, ( - ) new weaknesses Behavioral/Psych: ( - ) mood change, ( - ) new changes  All other systems were reviewed with the patient and are negative.  SUMMARY OF ONCOLOGIC HISTORY: Oncology History  Cancer of base of tongue ([place])  07/29/2018 Imaging   Neck ultrasound: IMPRESSION: 16 x 30 mm solid mass in the right submandibular area. This may represent a submandibular mass or enlarged lymph node. Recommend CT neck with contrast for further evaluation.   08/03/2018 Imaging   CT neck (at [facility]): IMPRESSION: 1. 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 There is a single ipsilateral malignant lymph node measuring 3 cm. 2. Nodular thickening along the anterior right sublingual gland, attention on follow-up PET.   09/04/2018 Procedure   US-guided bx of the R submandibular mass   09/04/2018 Pathology Results   Accession: [id number]  Lymph node, needle/core biopsy, right submandibular - SQUAMOUS CELL CARCINOMA. - SEE MICROSCOPIC DESCRIPTION.   09/11/2018 Imaging   PET: IMPRESSION: Hypermetabolism along the right base of tongue, corresponding to the patient's known primary oropharyngeal cancer.   Ipsilateral level 2 cervical nodal metastasis.    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   11/16/2018 -  Chemotherapy   The patient had palonosetron (ALOXI) injection 0.25 mg, 0.25 mg, Intravenous,  Once, 6 of 6 cycles Administration: 0.25 mg (11/16/2018), 0.25 mg (11/23/2018), 0.25 mg (11/30/2018), 0.25 mg (12/07/2018), 0.25 mg (12/14/2018), 0.25 mg (12/28/2018) CARBOplatin (PARAPLATIN) 200 mg in sodium chloride 0.9 % 250 mL chemo infusion, 200 mg (100 % of original dose 202.8 mg), Intravenous,  Once, 6 of 6 cycles Dose modification: 202.8 mg (original dose 202.8 mg, Cycle 1), 260 mg (original dose 202.8 mg, Cycle 6, Reason: Provider Judgment) Administration: 200 mg (11/16/2018), 200 mg (11/23/2018), 260 mg (11/30/2018), 260 mg (12/07/2018), 260 mg (12/14/2018), 260 mg (12/28/2018)  for chemotherapy treatment.      I have reviewed the past medical history, past surgical history, social history and family history with the patient and they are unchanged from previous note.  ALLERGIES:  is allergic to lisinopril.  MEDICATIONS:  Current Outpatient Medications  Medication Sig Dispense Refill  . Apixaban (ELIQUIS PO) Take 5 mg by mouth 2 (two) times daily.     . metoprolol succinate (TOPROL-XL) 100 MG 24 hr tablet Take 1 tablet (100 mg total)  by mouth daily. Take with or immediately following a meal. 90 tablet 3  . Nutritional Supplements (FEEDING SUPPLEMENT, OSMOLITE 1.5 CAL,) LIQD 1 1/2 bottles Osmolite 1.5 via PEG with 60 mL water before and after each bolus feeding QID. 1 OZ Prosource or equivalent via PEG QID with 60 mL water flush after. Provides 2530 kcal, 129 gm Pro: >90% estimated needs. Please send formula, Prosource and supplies. 1422 mL 6  . omeprazole (PRILOSEC) 20 MG capsule Take 20 mg by mouth daily.    . tamsulosin (FLOMAX) 0.4 MG CAPS capsule Take 0.4 mg by mouth daily after supper.     No current facility-administered medications for this visit.      PHYSICAL EXAMINATION: ECOG PERFORMANCE STATUS: 1 - Symptomatic but completely ambulatory  Today's Vitals   01/17/19 1329 01/17/19 1333  BP: 126/76   Pulse: 77   Resp: 17   Temp: 98.2 F (36.8 C)   TempSrc: Temporal   SpO2: 98%   Weight: 228 lb 8 oz (103.6 kg)   Height: 5\' 10"  (1.778 m)   PainSc:  0-No pain   Body mass index is 32.79 kg/m.  Filed Weights   01/17/19 1329  Weight: 228 lb 8 oz (103.6 kg)    GENERAL: alert, no distress and comfortable SKIN: skin color, texture, turgor are normal, no rashes or significant lesions EYES: conjunctiva are pink and non-injected, sclera clear OROPHARYNX: small area of white plaques over the tongue, non-bleeding  NECK: supple, non-tender LYMPH:  Stable R Level II cervical LN ~1.5cm, no new lymphadenopathy  LUNGS: clear to auscultation with normal breathing effort HEART: regular rate & rhythm and no murmurs and no lower extremity edema ABDOMEN: soft, non-tender, non-distended, normal bowel sounds Musculoskeletal: no cyanosis of digits and no clubbing  PSYCH: alert & oriented x 3, fluent speech  LABORATORY DATA:  I have reviewed the data as listed    Component Value Date/Time   NA 138 01/03/2019 0950   K 3.9 01/03/2019 0950   CL 101 01/03/2019 0950   CO2 25 01/03/2019 0950   GLUCOSE 101 (H) 01/03/2019 0950   BUN 17 01/03/2019 0950   CREATININE 0.99 01/03/2019 0950   CALCIUM 8.7 (L) 01/03/2019 0950   PROT 6.6 10/18/2018 0846   ALBUMIN 3.9 10/18/2018 0846   AST 21 10/18/2018 0846   ALT 19 10/18/2018 0846   ALKPHOS 39 10/18/2018 0846   BILITOT 0.6 10/18/2018 0846   GFRNONAA >60 01/03/2019 0950   GFRAA >60 01/03/2019 0950    No results found for: SPEP, UPEP  Lab Results  Component Value Date   WBC 4.1 01/17/2019   NEUTROABS 3.1 01/17/2019   HGB 13.6 01/17/2019   HCT 39.9 01/17/2019   MCV 89.9 01/17/2019   PLT 180 01/17/2019      Chemistry      Component Value Date/Time   NA 138 01/03/2019 0950   K 3.9  01/03/2019 0950   CL 101 01/03/2019 0950   CO2 25 01/03/2019 0950   BUN 17 01/03/2019 0950   CREATININE 0.99 01/03/2019 0950      Component Value Date/Time   CALCIUM 8.7 (L) 01/03/2019 0950   ALKPHOS 39 10/18/2018 0846   AST 21 10/18/2018 0846   ALT 19 10/18/2018 0846   BILITOT 0.6 10/18/2018 0846       RADIOGRAPHIC STUDIES: I have personally reviewed the radiological images as listed below and agreed with the findings in the report. Ir Replc Gastro/colonic Tube Percut W/fluoro  Result Date: 01/01/2019 INDICATION:  73 year old male with a history of head neck carcinoma, with percutaneous gastrostomy placed 11/07/2018. Increasing resistance to usage has been encountered with concern for either occluded tube or displacement. EXAM: RESCUE AND REPLACEMENT OF PERCUTANEOUS GASTROSTOMY MEDICATIONS: None ANESTHESIA/SEDATION: None CONTRAST:  15 cc-administered into the gastric lumen. FLUOROSCOPY TIME:  Fluoroscopy Time: 3 minutes 6 seconds COMPLICATIONS: None PROCEDURE: Informed written consent was obtained from the patient after a thorough discussion of the procedural risks, benefits and alternatives. All questions were addressed. Maximal Sterile Barrier Technique was utilized including caps, mask, sterile gowns, sterile gloves, sterile drape, hand hygiene and skin antiseptic. A timeout was performed prior to the initiation of the procedure. Initial images with injection of the tube demonstrated that the tube has been withdrawn from the lumen with a small tract remaining connecting the lumen of the tube with the lumen of the stomach. Removal of the gastrostomy tube was performed with an attempt at placing a new 61 French balloon retention gastrostomy. Resistance was encountered. A Kumpe the catheter was then navigated through the ostomy site into the lumen of the stomach using navigation with contrast and manipulation under fluoroscopy. Amplatz wire was placed into the stomach and the Kumpe the was  removed. Eighteen [place] and then 20 [place] dilation of the tract performed. Ultimately a new 67 French balloon retention into it percutaneous gastrostomy was placed with a coaxial 35 cm 8 [place] introducer from a sheath set. 8 cc of saline was used to inflate the balloon, and contrast confirmed location within the stomach. Final images were stored. Sterile bandage was placed. Patient tolerated the procedure well and remained hemodynamically stable throughout. No complications were encountered and no significant blood loss. IMPRESSION: Status post placement of a new 21 French balloon retention gastrostomy tube as a rescue for a displaced pull-through gastrostomy tube. Signed, Dulcy Fanny. Dellia Nims, RPVI [facility] Radiology Electronically Signed   By: Corrie Mckusick D.O.   On: 01/01/2019 11:05   Ir Replace G-tube Complex Wo Fluoro (tract Rev)  Result Date: 01/16/2019 INDICATION: Patient with history of head neck cancer, pull-through gastrostomy tube placement on 11/07/2018 which subsequently pulled into the ventral abdominal wall requiring fluoroscopic guided replacement on 01/01/2019 Patient now presents for evaluation with complaint persistent leaking about the gastrostomy tube. EXAM: FLUOROSCOPIC GUIDED REPLACEMENT OF GASTROSTOMY TUBE COMPARISON:  Pull-through gastrostomy tube placement-11/07/2018; fluoroscopic guided gastrostomy tube exchange-01/01/2019 MEDICATIONS: None. CONTRAST:  24mL OMNIPAQUE IOHEXOL 300 MG/ML SOLN administered into the gastric lumen FLUOROSCOPY TIME:  1 minutes, 54 seconds ([id number]) COMPLICATIONS: None immediate. PROCEDURE: Informed written consent was obtained from the patient after a discussion of the risks, benefits and alternatives to treatment. Questions regarding the procedure were encouraged and answered. A timeout was performed prior to the initiation of the procedure. The upper abdomen and external portion of the existing  gastrostomy tube was prepped and draped in the usual sterile fashion, and a sterile drape was applied covering the operative field. Maximum barrier sterile technique with sterile gowns and gloves were used for the procedure. A timeout was performed prior to the initiation of the procedure. Contrast was injected via the existing 20 French balloon retention gastrostomy tube demonstrating opacification the gastric lumen however a once again appeared the balloon retention head retracted outside the gastric lumen. As such, the external portion of the existing gastrostomy tube was cut and cannulated with a short Amplatz wire which was coiled within gastric lumen. Under intermittent fluoroscopic guidance, the existing 20 French gastrostomy tube was  removed, the track was serially dilated ultimately a new slightly larger now 66 French gastrostomy tube was advanced through the gastrostomy track into the gastric lumen. The retention balloon was insufflated with approximately 10 cc of saline and dilute contrast. Fluoroscopic image was saved demonstrated easy mobility of the inflated gastrostomy balloon within the stomach lumen confirming appropriate positioning. Next, the balloon was pulled against the ventral aspect of the inter gastric lumen and the external disc was cinched at the approximately 8 cm marker. Contrast was injected and several spot fluoroscopic images were obtained in various obliquities to confirm appropriate intraluminal positioning. A dressing was placed. The patient tolerated the procedure well without immediate postprocedural complication. IMPRESSION: Successful fluoroscopic guided exchange, repositioning and up sizing new 22-French gastrostomy tube. The new gastrostomy tube is ready for immediate use. PLAN: Patient was instructed to maintain the external disc at the approximately 8 cm marker as patient's large abdominal pannus is likely contributing to inadvertent retraction of the gastrostomy tube  outside the gastric lumen. Electronically Signed   By: Sandi Mariscal M.D.   On: 01/16/2019 15:07

## 2019-01-17 NOTE — Progress Notes (Signed)
Patient Name: Neil [name] MRN: [id number] DOB: 05-06-1945 Referring Physician: Corey Skains (Profile Not Attached) Date of Service: 01/03/2019 [facility], [place]                                                        End Of Treatment Note  Diagnoses: C01-Malignant neoplasm of base of tongue  Cancer Staging: Stage I (cT2, N1, M0) squamous cell carcinoma, p16+  Intent: Curative  Radiation Treatment Dates: 11/16/2018 through 01/03/2019  Site Technique Total Dose (Gy) Dose per Fx (Gy) Completed Fx Beam Energies  Head & neck: HN_BOT IMRT 70/70 2 35/35 6X   Narrative: The patient tolerated radiation therapy relatively well. He reported throat pain and pain to the radiation site, moderate to severe fatigue, difficulty with swallowing, thick saliva, and diminished taste. He experienced dry skin and peeling to the radiation site and endorsed using sonafine and neosporin. He stated he used his PEG 4-5 times a day. He denied any mouth ulcers.   Plan: The patient will follow-up with radiation oncology in 2 weeks.  ________________________________________________   Lonie Peak, MD  This document serves as a record of services personally performed by Lonie Peak, MD. It was created on her behalf by Mickie Bail, a trained medical scribe. The creation of this record is based on the scribe's personal observations and the provider's statements to them. This document has been checked and approved by the attending provider.

## 2019-01-18 ENCOUNTER — Telehealth: Payer: Self-pay | Admitting: Cardiovascular Disease

## 2019-01-18 ENCOUNTER — Telehealth: Payer: Self-pay | Admitting: Hematology

## 2019-01-18 NOTE — Telephone Encounter (Signed)
Returned call to patient.  Will have him cut metoprolol succ to 50 mg daily.  He notes most home BP readings looking great [id number] systolic.  Heart rates have dropped to 50-65 range.  If he continues to have dizziness in 7-10 days he should let us know, it may be something other than metoprolol.

## 2019-01-18 NOTE — Progress Notes (Signed)
Oncology Nurse Navigator Documentation  Met briefly with Mr. Selover prior to follow-up appt with Dr. Isidore Moos to check on status of PEG s/p yesterday morning's IR visit. He reported PEG was determined to have completely dislodged, was replaced for second time.   He indicated resumption of goal rate gravity instillations of Osmolite and water without difficulty. He agreed to call me as needed.    Gayleen Orem, RN, BSN Head & Neck Oncology Nurse [place] at [place] [phone]

## 2019-01-18 NOTE — Telephone Encounter (Signed)
New message  Pt c/o medication issue:  1. Name of Medication: metoprolol succinate (TOPROL-XL) 100 MG 24 hr tablet  2. How are you currently taking this medication (dosage and times per day)? Take 1 tablet (100 mg total) by mouth daily. Take with or immediately following a meal.  3. Are you having a reaction (difficulty breathing--STAT)? Yes  4. What is your medication issue? Patient has been dizzy lately, patient's bp is 113/73 hr 50, patient would like to know if the dosage is making him dizzy. Please give a call back to discuss.

## 2019-01-18 NOTE — Telephone Encounter (Signed)
I left a message regarding schedule  

## 2019-01-19 ENCOUNTER — Other Ambulatory Visit: Payer: Self-pay | Admitting: Radiation Oncology

## 2019-01-19 DIAGNOSIS — C01 Malignant neoplasm of base of tongue: Secondary | ICD-10-CM

## 2019-01-20 DIAGNOSIS — Z03818 Encounter for observation for suspected exposure to other biological agents ruled out: Secondary | ICD-10-CM | POA: Diagnosis not present

## 2019-01-23 ENCOUNTER — Ambulatory Visit: Payer: Medicare Other

## 2019-01-24 ENCOUNTER — Telehealth: Payer: Self-pay | Admitting: *Deleted

## 2019-01-24 NOTE — Telephone Encounter (Signed)
Oncology Nurse Navigator Documentation  In support of appointment compliance for H&N patient follow-up with SLP Garald Balding, called patient, confirmed understanding of tomorrow's 11:00 [facility] appointment,, encouraged arrival 15-20 minutes prior for registration followed by arrival to Radiation Waiting.  He understands he will meet with RD Dory Peru following SLP.  Gayleen Orem, RN, BSN Head & Neck Oncology Nurse [place] at [place] [phone]

## 2019-01-25 ENCOUNTER — Ambulatory Visit: Payer: Medicare Other | Attending: Radiation Oncology

## 2019-01-25 ENCOUNTER — Encounter: Payer: Self-pay | Admitting: General Practice

## 2019-01-25 ENCOUNTER — Inpatient Hospital Stay: Payer: Medicare Other | Admitting: Nutrition

## 2019-01-25 ENCOUNTER — Other Ambulatory Visit: Payer: Self-pay

## 2019-01-25 DIAGNOSIS — R1310 Dysphagia, unspecified: Secondary | ICD-10-CM | POA: Diagnosis not present

## 2019-01-25 NOTE — Progress Notes (Signed)
[place] Spiritual Care Note  Had a delightful in-person Spiritual Care encounter with Neil [name], per referral from [facility]. He is realizing that social isolation and a series of gray days can lead to depressed mood and energy. For him, the give-and-take of engaged conversation--especially with laughter included!--is a huge part of his regular meaning-making. 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 We plan to keep in touch to help meet his needs for social wellness and spiritual reflection. I will call in ca two weeks, and he has my direct dial number.   [place], [place], [facility] Pager [phone] Voicemail [phone]

## 2019-01-25 NOTE — Progress Notes (Signed)
Nutrition follow-up completed with patient status post treatment for tongue cancer. Patient's last treatment was October 28. Current weight documented is 228.8 pounds on November 19. He is tolerating 4 bottles of Osmolite 1.5 and 4 bottles of water via feeding tube daily. He is drinking 2 bottles of water every day. He tolerated applesauce today with speech therapist. Reports his constipation has resolved on MiraLAX. His taste has improved.  Estimated nutrition needs: 2440-2640 cal, 130--150 g protein, 2.6 L fluid  Nutrition diagnosis: Inadequate oral intake continues.  Intervention: Patient to continue 4 bottles Osmolite 1.5 and 2 bottles water via feeding tube daily. He is to continue bowel regimen. Reviewed strategies for increasing oral intake and recommended patient keep food journal. Referral made to spiritual care. Provided support and encouragement.  Monitoring, evaluation, goals: Patient will tolerate increased oral intake so tube feeding can be decreased.  Next visit: Thursday, December 3.  **Disclaimer: This note was dictated with voice recognition software. Similar sounding words can inadvertently be transcribed and this note may contain transcription errors which may not have been corrected upon publication of note.**

## 2019-01-25 NOTE — Patient Instructions (Signed)
Food journal - write down what you had and the date, how much you had, and number 1-10 for ease of passage through throat as well as how well it tasted.

## 2019-01-25 NOTE — Therapy (Signed)
SLP SHORT TERM GOAL #1   Title  Pt will demo understanding of proper procedure for HEP with rare min A    Period  --   session   Status  Partially Met      SLP SHORT TERM GOAL #2   Title  pt will tell SLP why he is completing HEP    Period  --   session   Status  Partially Met       SLP Long Term Goals - 01/25/19 1145      SLP LONG TERM GOAL #1   Title  pt will demo understanding of proper HEP procedure with modified indpendence x2 sessions    Baseline  12-25-18    Time  3    Period  --   sessions   Status  On-going      SLP LONG TERM GOAL #2   Title  pt will tell SLP 3 overt s/sx aspiration PNA with modified independence    Status  Achieved      SLP LONG TERM GOAL #3   Title  pt will tell the benefit of a food journal for return to pre-chemorad diet    Status  Achieved      SLP LONG TERM GOAL #4   Title  pt will tell when to decr frequency of HEP from daily to  x2-3/week x2 sessions    Time  3    Period  --   visits   Status  On-going       Plan - 01/25/19 1144    Clinical Impression Statement  Pt presents today with [facility] swalowing for liquids and puree (applesauce). "I'm very encouraged after our visit" pt stated. Pt still has some dysgeusia. No overt s/sx of aspiration PNA noted today, nor reported. Pt completed HEP today with rare min A; see "pt education". Data show that probability of dysphagia increases during chemorad tx and afterwards, and is mitigated by HEP focusing on swallowing musculature. Pt would benefit from follow up skilled ST to assess pt's safety with POs and also to assess pt's ability to correctly perform HEP. Visits will be performed virtually (telephone or WEbex) or in - person.    Speech Therapy Frequency  --   approx once every 4 weeks   Duration  --   90 days (4 total sessions)   Treatment/Interventions  Aspiration precaution training;Pharyngeal strengthening exercises;Diet toleration management by SLP;Internal/external aids;Patient/family education;SLP instruction and feedback;Trials of upgraded texture/liquids;Environmental controls    Potential to [place]  provided today       Patient will benefit from skilled therapeutic intervention in order to improve the following deficits and impairments:   Dysphagia, unspecified type - Plan: SLP plan of care cert/re-cert    Problem List Patient Active Problem List   Diagnosis Date Noted  . Protein malnutrition ([place]) 11/29/2018  . Port-A-Cath in place 11/22/2018  . CKD (chronic kidney disease), stage III 10/18/2018  . Cancer of base of tongue ([place]) 10/16/2018    [facility] ,[place], [place]  01/25/2019, 11:47 AM  [place] [address] [place], [place], [address] Phone: [phone]   Fax:  [phone]   Name: Tommaso Cavitt MRN: [id number] Date of Birth: 02-15-1946  [place] [address] [place], [place], [address] Phone: [phone]   Fax:  [phone]  Speech Language Pathology Treatment  Patient Details  Name: Claude Waldman MRN: [id number] Date of Birth: 1945-03-15 Referring Provider (SLP): Eppie Gibson, MD   Encounter Date: 01/25/2019  End of Session - 01/25/19 1143    Visit Number  4    Number of Visits  7    Date for SLP Re-Evaluation  04/25/19   90 days   SLP Start Time  17    SLP Stop Time   1100    SLP Time Calculation (min)  40 min    Activity Tolerance  Patient tolerated treatment well       Past Medical History:  Diagnosis Date  . Atrial fibrillation ([place])   . Chronic anticoagulation    On Eliquis  . GERD (gastroesophageal reflux disease)   . Hypertension   . Joint pain     Past Surgical History:  Procedure Laterality Date  . HEMORROIDECTOMY  2004  . HERNIA REPAIR  1960  . IR GASTROSTOMY TUBE MOD SED  11/07/2018  . IR IMAGING GUIDED PORT INSERTION  11/07/2018  . IR [place] TUBE PERCUT W/FLUORO  01/01/2019  . IR US GUIDANCE  09/04/2018  . IR US GUIDE BX ASP/DRAIN  09/04/2018  . MOUTH BIOPSY  09/04/2018   right jaw  . right testical removed    . TONSILLECTOMY      There were no vitals filed for this visit.  Subjective Assessment - 01/25/19 1050    Subjective  "I just have been down in the dumps. I'm on my third feeding tube." Pt completed 3 weeks ago.    Currently in Pain?  No/denies            ADULT SLP TREATMENT - 01/25/19 1052      General Information   Behavior/Cognition  Alert;Cooperative;Pleasant mood      Dysphagia Treatment   Temperature Spikes Noted  No    Respiratory Status  Room air    Oral Cavity - Dentition  Edentulous    Treatment Methods  Therapeutic exercise;Patient/caregiver education;Skilled observation    Patient observed directly with PO's  Yes    Type of PO's observed  Thin liquids;Dysphagia 1 (puree)     Oral Phase Signs & Symptoms  --   none noted   Pharyngeal Phase Signs & Symptoms  --   none noted   Other treatment/comments  "Everything tastes awful." Pt was provided applesauce and pt without overt s/sx aspiraiton. SLP discussed food journal with pt today and some things he oculd try to eat. HEP - pt has not been compiant with frequency or reps in the last two weeks, however his procedure req'd rare min A (tongue protrusion on [place]). Pt told SLP why he was doing HEP      Assessment / Recommendations / [place] with current plan of care      Dysphagia Recommendations   Diet recommendations  --   as tolerated   Medication Administration  --   as tolerated      SLP Education - 01/25/19 1143    Education Details  food journal, min A with [place]    Person(s) Educated  Patient    Methods  Explanation;Demonstration;Verbal cues    Comprehension  Verbalized understanding;Returned demonstration;Need further instruction;Verbal cues required       SLP Short Term Goals - 11/27/18 1422

## 2019-02-07 ENCOUNTER — Encounter: Payer: Self-pay | Admitting: General Practice

## 2019-02-07 NOTE — Progress Notes (Signed)
[facility] Spiritual Care Note  Reached Neil [name] by phone for follow-up spiritual, emotional, and social support. Provided empathic listening, pastoral reflection, and normalization of feelings as he processed his self-described impatience at awaiting the return of taste and the decline in need for a feeding tube. Helped him reframe the waiting as a spiritual practice that could align with observing the Advent season. He provided very kind spiritual hospitality to me as well and looks forward to identifying what kind of outreach and helping others he feels called to do as he regains strength and opportunity to connect in community. Plan to f/u by phone before Christmas for another pastoral check-in.   [place], [place], [facility] Pager [phone] Voicemail [phone]

## 2019-02-08 ENCOUNTER — Telehealth: Payer: Self-pay | Admitting: Cardiovascular Disease

## 2019-02-08 ENCOUNTER — Inpatient Hospital Stay: Payer: Medicare Other | Attending: Hematology | Admitting: Nutrition

## 2019-02-08 DIAGNOSIS — C01 Malignant neoplasm of base of tongue: Secondary | ICD-10-CM | POA: Insufficient documentation

## 2019-02-08 DIAGNOSIS — Z452 Encounter for adjustment and management of vascular access device: Secondary | ICD-10-CM | POA: Insufficient documentation

## 2019-02-08 DIAGNOSIS — D701 Agranulocytosis secondary to cancer chemotherapy: Secondary | ICD-10-CM | POA: Insufficient documentation

## 2019-02-08 DIAGNOSIS — D6481 Anemia due to antineoplastic chemotherapy: Secondary | ICD-10-CM | POA: Insufficient documentation

## 2019-02-08 DIAGNOSIS — E46 Unspecified protein-calorie malnutrition: Secondary | ICD-10-CM | POA: Insufficient documentation

## 2019-02-08 NOTE — Telephone Encounter (Signed)
New Message    Pt c/o medication issue:  1. Name of Medication: hydrochlorothiazide 25mg    2. How are you currently taking this medication (dosage and times per day)? Not taking   3. Are you having a reaction (difficulty breathing--STAT)? No   4. What is your medication issue? Pt says he went to the pharmacy yesterday and they gave him this medication. He is wondering if he is suppose to take it  He says he normally gets metoprolol succinate (TOPROL-XL) 100 MG

## 2019-02-08 NOTE — Progress Notes (Signed)
Nutrition follow-up completed with patient over the telephone. He is status post treatment for tongue cancer. He finished treatment on October 28. Last weight documented was 228.5 pounds November 11. Patient reports he feels well. He is tolerating 4 bottles of Osmolite 1.5 via PEG daily.  He gives 1 bottle at 9 AM, 12 noon, 3 PM, and 6 PM.  He is flushing his feeding tube with 60 mL of free water before and after each bolus feeding. Patient has been trying to increase oral intake and has tolerated yogurt, mashed potatoes with gravy, eggs with [facility], ginger ale, and Ensure clear. Patient is keeping a food journal and listing his successes with oral intake.  Estimated nutrition needs: 2440-2640 cal, 130-150 g protein, 2.6 L fluid.  Nutrition diagnosis: Inadequate oral intake continues.  Intervention: Patient should continue Osmolite 1.5, 1 bottle 4 times daily.  Educated patient on strategies for increasing oral intake. Encouraged him to continue food journal and documenting successes.  Monitoring, evaluation, goals: Patient will tolerate increased oral intake so tube feeding can be decreased.  Next visit: Monday, December 21 by telephone.  **Disclaimer: This note was dictated with voice recognition software. Similar sounding words can inadvertently be transcribed and this note may contain transcription errors which may not have been corrected upon publication of note.**

## 2019-02-08 NOTE — Telephone Encounter (Signed)
Reviewed last office note (11/15/18 ) and appears HCTZ 25 mg was started at that time Pt aware and verbalizes understanding and will also check B/P .Adonis Housekeeper

## 2019-02-19 ENCOUNTER — Ambulatory Visit: Payer: Medicare Other

## 2019-02-21 ENCOUNTER — Encounter: Payer: Self-pay | Admitting: Hematology

## 2019-02-21 ENCOUNTER — Inpatient Hospital Stay: Payer: Medicare Other

## 2019-02-21 ENCOUNTER — Other Ambulatory Visit: Payer: Self-pay

## 2019-02-21 ENCOUNTER — Inpatient Hospital Stay (HOSPITAL_BASED_OUTPATIENT_CLINIC_OR_DEPARTMENT_OTHER): Payer: Medicare Other | Admitting: Hematology

## 2019-02-21 ENCOUNTER — Encounter: Payer: Self-pay | Admitting: General Practice

## 2019-02-21 VITALS — BP 136/62 | HR 53 | Temp 97.9°F | Resp 17 | Ht 70.0 in | Wt 229.2 lb

## 2019-02-21 DIAGNOSIS — D6481 Anemia due to antineoplastic chemotherapy: Secondary | ICD-10-CM | POA: Diagnosis not present

## 2019-02-21 DIAGNOSIS — C01 Malignant neoplasm of base of tongue: Secondary | ICD-10-CM | POA: Diagnosis present

## 2019-02-21 DIAGNOSIS — Z95828 Presence of other vascular implants and grafts: Secondary | ICD-10-CM

## 2019-02-21 DIAGNOSIS — E46 Unspecified protein-calorie malnutrition: Secondary | ICD-10-CM | POA: Diagnosis not present

## 2019-02-21 DIAGNOSIS — E0789 Other specified disorders of thyroid: Secondary | ICD-10-CM

## 2019-02-21 DIAGNOSIS — D701 Agranulocytosis secondary to cancer chemotherapy: Secondary | ICD-10-CM

## 2019-02-21 DIAGNOSIS — Z452 Encounter for adjustment and management of vascular access device: Secondary | ICD-10-CM | POA: Diagnosis not present

## 2019-02-21 DIAGNOSIS — T451X5A Adverse effect of antineoplastic and immunosuppressive drugs, initial encounter: Secondary | ICD-10-CM

## 2019-02-21 LAB — CBC WITH DIFFERENTIAL (CANCER CENTER ONLY)
Abs Immature Granulocytes: 0.03 10*3/uL (ref 0.00–0.07)
Basophils Absolute: 0 10*3/uL (ref 0.0–0.1)
Basophils Relative: 1 %
Eosinophils Absolute: 0.1 10*3/uL (ref 0.0–0.5)
Eosinophils Relative: 2 %
HCT: 38.1 % — ABNORMAL LOW (ref 39.0–52.0)
Hemoglobin: 12.6 g/dL — ABNORMAL LOW (ref 13.0–17.0)
Immature Granulocytes: 1 %
Lymphocytes Relative: 7 %
Lymphs Abs: 0.3 10*3/uL — ABNORMAL LOW (ref 0.7–4.0)
MCH: 30.7 pg (ref 26.0–34.0)
MCHC: 33.1 g/dL (ref 30.0–36.0)
MCV: 92.9 fL (ref 80.0–100.0)
Monocytes Absolute: 0.4 10*3/uL (ref 0.1–1.0)
Monocytes Relative: 11 %
Neutro Abs: 3.1 10*3/uL (ref 1.7–7.7)
Neutrophils Relative %: 78 %
Platelet Count: 176 10*3/uL (ref 150–400)
RBC: 4.1 MIL/uL — ABNORMAL LOW (ref 4.22–5.81)
RDW: 17.2 % — ABNORMAL HIGH (ref 11.5–15.5)
WBC Count: 3.9 10*3/uL — ABNORMAL LOW (ref 4.0–10.5)
nRBC: 0 % (ref 0.0–0.2)

## 2019-02-21 LAB — CMP (CANCER CENTER ONLY)
ALT: 10 U/L (ref 0–44)
AST: 17 U/L (ref 15–41)
Albumin: 3.2 g/dL — ABNORMAL LOW (ref 3.5–5.0)
Alkaline Phosphatase: 41 U/L (ref 38–126)
Anion gap: 13 (ref 5–15)
BUN: 11 mg/dL (ref 8–23)
CO2: 23 mmol/L (ref 22–32)
Calcium: 8.3 mg/dL — ABNORMAL LOW (ref 8.9–10.3)
Chloride: 105 mmol/L (ref 98–111)
Creatinine: 0.79 mg/dL (ref 0.61–1.24)
GFR, Est AFR Am: >60 mL/min (ref >60)
GFR, Estimated: >60 mL/min (ref >60)
Glucose, Bld: 89 mg/dL (ref 70–99)
Potassium: 3.8 mmol/L (ref 3.5–5.1)
Sodium: 141 mmol/L (ref 135–145)
Total Bilirubin: 0.7 mg/dL (ref 0.3–1.2)
Total Protein: 6 g/dL — ABNORMAL LOW (ref 6.5–8.1)

## 2019-02-21 LAB — TSH: TSH: 0.935 u[IU]/mL (ref 0.320–4.118)

## 2019-02-21 MED ORDER — HEPARIN SOD (PORK) LOCK FLUSH 100 UNIT/ML IV SOLN
500.0000 [IU] | Freq: Once | INTRAVENOUS | Status: AC | PRN
  Administered 2019-02-21: 500 [IU]
  Filled 2019-02-21: qty 5

## 2019-02-21 MED ORDER — SODIUM CHLORIDE 0.9% FLUSH
10.0000 mL | INTRAVENOUS | Status: DC | PRN
  Administered 2019-02-21: 10 mL
  Filled 2019-02-21: qty 10

## 2019-02-21 NOTE — Progress Notes (Signed)
Neil [name] Spiritual Care Note  Reached Neil [name] by phone for follow-up pastoral support. Provided empathic listening, pastoral reflection, and emotional support while he continued to process his "impatience" (understandable frustration) with the slow return of taste, as well as the ways that he is using gratitude and perspective to cope. Neil [name] consistently offers spiritual hospitality to me as well. He plans to follow up by phone for a future update after the holidays.   [place], [place], [facility] Pager [phone] Voicemail [phone]

## 2019-02-21 NOTE — Progress Notes (Signed)
[name]  Patient Care Team: Bartholome Bill, MD as PCP - General (Family Medicine) O'Neal, Cassie Freer, MD as PCP - Cardiology (Cardiology) Eppie Gibson, MD as Attending Physician (Radiation Oncology) Leota Sauers, RN as Oncology Nurse Navigator [place], [place], [place] as Speech Language Pathologist (Speech Pathology) Karie Mainland, RD as Dietitian (Nutrition) Kennith Center, LCSW as Social Worker  HEME/ONC OVERVIEW: 1. 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 A-fib on Eliquis   2. 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, p16+ -S/p definitive chemoRT with weekly carboplatin x 6 doses    -Clinically, there is no evidence of palpable cervical adenopathy -Periodic TSH monitoring for any evidence of radiation-induced thyroid dysfunction   Chemotherapy-associated anemia -Secondary to recent chemotherapy -Hgb 12.6, stable  -Patient denies any symptom of bleeding -We will monitor for now  Chemotherapy-associated leukopenia -Secondary to recent chemotherapy -WBC 3.9k, overall stable  -Patient denies any symptoms of infection -We will monitor for now  Protein malnutrition -Secondary to chemoRT -Weight stable; feeding tube had to be replaced several times due to it falling out  -Currently taking ~4 Osmolite via feeding tube, and he is also slowly resuming PO intake  -I encouraged the  patient to adhere to nutritional recommendations, and increase PO intake as tolerated   No orders of the defined types were placed in this encounter.  All questions were answered. The patient knows to call the clinic with any problems, questions or concerns. No barriers to learning was detected.  Return in early 04/2018 for PET results and port flush.   [name], MD 02/21/2019 3:00 PM  CHIEF COMPLAINT: "I am doing better"  INTERVAL HISTORY: Neil [name] returns clinic for follow-up of squamous cell carcinoma of the right base of tongue s/p definitive chemoradiation.  Patient reports that his taste is still very limited, but he has discovered that V8 juice, Ensure clear, and a few other drinks have relatively good taste.  He is trying to resume some oral intake, including chicken noodle soup, but his appetite is still limited.  He takes 4 Osmolite via feeding tube per day.  He denies any significant pain or difficulty with swallowing.  He is otherwise feeling well today, and denies any other complaint.  REVIEW OF SYSTEMS:   Constitutional: ( - ) fevers, ( - )  chills , ( - ) night sweats Eyes: ( - ) blurriness of vision, ( - ) double vision, ( - ) watery eyes Ears, nose, mouth, throat, and face: ( - ) mucositis, ( - ) sore throat Respiratory: ( - ) cough, ( - ) dyspnea, ( - ) wheezes Cardiovascular: ( - ) palpitation, ( - ) chest discomfort, ( - ) lower extremity swelling Gastrointestinal:  ( - ) nausea, ( - ) heartburn, ( - ) change in bowel habits Skin: ( - ) abnormal skin rashes Lymphatics: ( - ) new lymphadenopathy, ( - ) easy bruising  Neurological: ( - ) numbness, ( - ) tingling, ( - ) new weaknesses Behavioral/Psych: ( - ) mood change, ( - ) new changes  All other systems were reviewed with the patient and are negative.  SUMMARY OF ONCOLOGIC HISTORY: Oncology History  Cancer of base of tongue ([place])  07/29/2018 Imaging   Neck ultrasound: IMPRESSION: 16 x 30 mm solid mass in the  right submandibular area. This may represent a submandibular mass or enlarged lymph node. Recommend CT neck with contrast for further evaluation.   08/03/2018 Imaging   CT neck (at [facility]): IMPRESSION: 1. 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 There is a single ipsilateral malignant lymph node measuring 3 cm. 2. Nodular thickening along the anterior right sublingual gland, attention on follow-up PET.   09/04/2018 Procedure   US-guided bx of the R submandibular mass   09/04/2018 Pathology Results   Accession: [id number]  Lymph node, needle/core biopsy, right submandibular - SQUAMOUS CELL CARCINOMA. - SEE MICROSCOPIC DESCRIPTION.   09/11/2018 Imaging   PET: IMPRESSION: Hypermetabolism along the right base of tongue, corresponding to the patient's known primary oropharyngeal cancer.   Ipsilateral level 2 cervical nodal metastasis.   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   11/16/2018 -  Chemotherapy   The patient had palonosetron (ALOXI) injection 0.25 mg, 0.25 mg, Intravenous,  Once, 6 of 6 cycles Administration: 0.25 mg (11/16/2018), 0.25 mg (11/23/2018), 0.25 mg (11/30/2018), 0.25 mg (12/07/2018), 0.25 mg (12/14/2018), 0.25 mg (12/28/2018) CARBOplatin (PARAPLATIN) 200 mg in sodium chloride 0.9 % 250 mL chemo infusion, 200 mg (100 % of original dose 202.8 mg), Intravenous,  Once, 6 of 6 cycles Dose modification: 202.8 mg (original dose 202.8 mg, Cycle 1), 260 mg (original dose 202.8 mg, Cycle 6, Reason: Provider Judgment) Administration: 200 mg (11/16/2018), 200 mg (11/23/2018), 260 mg (11/30/2018), 260 mg (12/07/2018), 260 mg (12/14/2018), 260 mg (12/28/2018)  for chemotherapy treatment.      I have reviewed the past medical history, past surgical history, social history and family history with the patient and they are  unchanged from previous note.  ALLERGIES:  is allergic to lisinopril.  MEDICATIONS:  Current Outpatient Medications  Medication Sig Dispense Refill  . Apixaban (ELIQUIS PO) Take 5 mg by mouth 2 (two) times daily.     . hydrochlorothiazide (HYDRODIURIL) 25 MG tablet Take 25 mg by mouth daily.    . Nutritional Supplements (FEEDING SUPPLEMENT, OSMOLITE 1.5 CAL,) LIQD 1 1/2 bottles Osmolite 1.5 via PEG with 60 mL water before and after each bolus feeding QID. 1 OZ Prosource or equivalent via PEG QID with 60 mL water flush after. Provides 2530 kcal, 129 gm Pro: >90% estimated needs. Please send formula, Prosource and supplies. 1422 mL 6  . omeprazole (PRILOSEC) 20 MG capsule Take 20 mg by mouth daily.    . tamsulosin (FLOMAX) 0.4 MG CAPS capsule Take 0.4 mg by mouth daily after supper.    . metoprolol succinate (TOPROL-XL) 100 MG 24 hr tablet Take 1 tablet (100 mg total) by mouth daily. Take with or immediately following a meal. (Patient taking differently: Take 50 mg by mouth daily. Take with or immediately following a meal.) 90 tablet 3  . potassium chloride SA (KLOR-CON) 20 MEQ tablet Take 1 tablet (20 mEq total) by mouth daily for 5 days. 5 tablet 0   No current facility-administered medications for this visit.  PHYSICAL EXAMINATION: ECOG PERFORMANCE STATUS: 1 - Symptomatic but completely ambulatory  Today's Vitals   02/21/19 1422 02/21/19 1431  BP: 136/62   Pulse: (!) 53   Resp: 17   Temp: 97.9 F (36.6 C)   TempSrc: Temporal   SpO2: 100%   Weight: 229 lb 3.2 oz (104 kg)   Height: 5\' 10"  (1.778 m)   PainSc:  0-No pain   Body mass index is 32.89 kg/m.  Filed Weights   02/21/19 1422  Weight: 229 lb 3.2 oz (104 kg)    GENERAL: alert, no distress and comfortable SKIN: skin color, texture, turgor are normal, no rashes or significant lesions EYES: conjunctiva are pink and non-injected, sclera clear OROPHARYNX: no exudate, no erythema; lips, buccal mucosa, and tongue normal   NECK: supple, non-tender LYMPH:  no palpable lymphadenopathy in the cervical LUNGS: clear to auscultation with normal breathing effort HEART: regular rate & rhythm and no murmurs and no lower extremity edema ABDOMEN: soft, non-tender, non-distended, normal bowel sounds Musculoskeletal: no cyanosis of digits and no clubbing  PSYCH: alert & oriented x 3, fluent speech  LABORATORY DATA:  I have reviewed the data as listed    Component Value Date/Time   NA 141 02/21/2019 1350   K 3.8 02/21/2019 1350   CL 105 02/21/2019 1350   CO2 23 02/21/2019 1350   GLUCOSE 89 02/21/2019 1350   BUN 11 02/21/2019 1350   CREATININE 0.79 02/21/2019 1350   CALCIUM 8.3 (L) 02/21/2019 1350   PROT 6.0 (L) 02/21/2019 1350   ALBUMIN 3.2 (L) 02/21/2019 1350   AST 17 02/21/2019 1350   ALT 10 02/21/2019 1350   ALKPHOS 41 02/21/2019 1350   BILITOT 0.7 02/21/2019 1350   GFRNONAA >60 02/21/2019 1350   GFRAA >60 02/21/2019 1350    No results found for: SPEP, UPEP  Lab Results  Component Value Date   WBC 3.9 (L) 02/21/2019   NEUTROABS 3.1 02/21/2019   HGB 12.6 (L) 02/21/2019   HCT 38.1 (L) 02/21/2019   MCV 92.9 02/21/2019   PLT 176 02/21/2019      Chemistry      Component Value Date/Time   NA 141 02/21/2019 1350   K 3.8 02/21/2019 1350   CL 105 02/21/2019 1350   CO2 23 02/21/2019 1350   BUN 11 02/21/2019 1350   CREATININE 0.79 02/21/2019 1350      Component Value Date/Time   CALCIUM 8.3 (L) 02/21/2019 1350   ALKPHOS 41 02/21/2019 1350   AST 17 02/21/2019 1350   ALT 10 02/21/2019 1350   BILITOT 0.7 02/21/2019 1350       RADIOGRAPHIC STUDIES: I have personally reviewed the radiological images as listed below and agreed with the findings in the report. No results found.

## 2019-02-21 NOTE — Patient Instructions (Signed)

## 2019-02-22 ENCOUNTER — Other Ambulatory Visit (HOSPITAL_COMMUNITY): Payer: Medicare Other

## 2019-02-22 ENCOUNTER — Telehealth: Payer: Self-pay | Admitting: Hematology

## 2019-02-22 NOTE — Telephone Encounter (Signed)
I left a message regarding schedule  

## 2019-02-26 ENCOUNTER — Ambulatory Visit: Payer: Medicare Other | Admitting: Nutrition

## 2019-02-26 NOTE — Progress Notes (Signed)
Telephone follow up completed with patient. He reports he is feeling better. States weight is stable at 229.2 pounds. He is eating more variety and a little more volume. Patient continues to use 4 bottles of Osmolite 1.5 via PEG daily He is drinking some Ensure and his goal is to consume 2 daily. He enjoys eggs, tomato juice, V8 juice, and other spicier foods. He denies other nutrition impact symptoms.  Nutrition diagnosis: Inadequate oral intake is improving.  Intervention: Provided support and encouragement for patient to continue to try small amounts of food throughout the day. He can continue 4 bottles Osmolite 1.5 and 2 bottles of Ensure daily. Again reviewed how to gradually increase oral intake and decrease tube feeding.  Monitoring, evaluation, goals: Patient will continue to increase oral intake while decreasing tube feeding while maintaining weight.  Next visit: Patient will call me with further questions or concerns.  **Disclaimer: This note was dictated with voice recognition software. Similar sounding words can inadvertently be transcribed and this note may contain transcription errors which may not have been corrected upon publication of note.**

## 2019-03-13 ENCOUNTER — Telehealth: Payer: Self-pay

## 2019-03-13 DIAGNOSIS — L03221 Cellulitis of neck: Secondary | ICD-10-CM | POA: Diagnosis not present

## 2019-03-13 NOTE — Telephone Encounter (Signed)
Received voicemail from patient stating that he woke up and the right side of his face and neck were swollen and it hurts to touch. Called patient back and patient stated that he was not having any issues with breathing, chewing, or swallowing but he wanted to know if he needed to come in to the [facility] or be seen by his Primary doctor. I contacted Dr Maylon Peppers who recommended that  patient see Primary doctor because it may be skin infection or dental abscess. I let patient know of the recommendation and also that if he is not able to see PCP quickly to call back and be seen by symptom management here at the cancer center. Patient verbalized understanding. No further problems or concerns at this time.

## 2019-03-21 ENCOUNTER — Other Ambulatory Visit: Payer: Self-pay

## 2019-03-21 ENCOUNTER — Ambulatory Visit: Payer: Medicare Other | Attending: Radiation Oncology

## 2019-03-21 DIAGNOSIS — R1310 Dysphagia, unspecified: Secondary | ICD-10-CM | POA: Diagnosis not present

## 2019-03-21 NOTE — Therapy (Signed)
[facility] [address] [place], [place], [address] Phone: [phone]   Fax:  [phone]  Speech Language Pathology Treatment  Patient Details  Name: Neil [name] MRN: [id number] Date of Birth: Jul 07, 1945 Referring Provider (SLP): Eppie Gibson, MD   Encounter Date: 03/21/2019  End of Session - 03/21/19 1530    Visit Number  5    Number of Visits  7    Date for SLP Re-Evaluation  04/25/19    SLP Start Time  0277    SLP Stop Time   4128    SLP Time Calculation (min)  43 min    Activity Tolerance  Patient tolerated treatment well       Past Medical History:  Diagnosis Date  . Atrial fibrillation ([place])   . Chronic anticoagulation    On Eliquis  . GERD (gastroesophageal reflux disease)   . Hypertension   . Joint pain     Past Surgical History:  Procedure Laterality Date  . HEMORROIDECTOMY  2004  . HERNIA REPAIR  1960  . IR GASTROSTOMY TUBE MOD SED  11/07/2018  . IR IMAGING GUIDED PORT INSERTION  11/07/2018  . IR [place] TUBE PERCUT W/FLUORO  01/01/2019  . IR US GUIDANCE  09/04/2018  . IR US GUIDE BX ASP/DRAIN  09/04/2018  . MOUTH BIOPSY  09/04/2018   right jaw  . right testical removed    . TONSILLECTOMY      There were no vitals filed for this visit.  Subjective Assessment - 03/21/19 1452    Subjective  I can only take so much Osmolite until I'm full. I'm able to drink Ensure again." Pt has 2-3 Ensure/day.    Currently in Pain?  No/denies            ADULT SLP TREATMENT - 03/21/19 1454      General Information   Behavior/Cognition  Alert;Cooperative;Pleasant mood      Dysphagia Treatment   Temperature Spikes Noted  No    Respiratory Status  Room air    Oral Cavity - Dentition  Edentulous    Treatment Methods  Therapeutic exercise;Patient/caregiver education;Skilled observation    Patient observed directly with PO's  Yes    Type of PO's observed  Thin liquids    Oral Phase Signs  & Symptoms  Other (comment)   no overt s/s with H2O   Pharyngeal Phase Signs & Symptoms  Other (comment)   no overt s/s aspiration with H2O   Other treatment/comments  Pt eating moist fish, mashed potatoes, boiled eggs, blended spaghetti sauce. Pt tried V-8 approx 4 weeks ago. Pt is doing 4 cartons/day with Ensure and minimal solid PO daily. Pt does not want applesauce for POs today. Pt reports globus feeling first thing in the morning - SLP suggested it could be saliva/secretions that have gelled/hardened overnight. Pt gags and this loosens up the secretions. SLP encouraged pt to try warm water and lemon with hard swallows and gentle throat clears. Pt's frequency of HEP has not notably improved - SLP reiterated to pt that he needed to allow best chance for WNL swallow function with BID completion. SLP req'd to provide pt usual min-mod cues with HEP      Assessment / Recommendations / Plan   Plan  Continue with current plan of care      Dysphagia Recommendations   Diet recommendations  --   as tolerated   Liquids provided via  Cup    Medication Administration  --  Date Noted  . Protein malnutrition ([place]) 11/29/2018  . Port-A-Cath in place 11/22/2018  . CKD (chronic kidney disease), stage III 10/18/2018  . Cancer of base of tongue ([place]) 10/16/2018    [facility] ,[place], [place]  03/21/2019, 3:32 PM  [facility] [address] [place], [place], [address] Phone: [phone]   Fax:  [phone]   Name: Neil [name] MRN: [id number] Date of Birth: 1945-08-22  [facility] [address] [place], [place], [address] Phone: [phone]   Fax:  [phone]  Speech Language Pathology Treatment  Patient Details  Name: Neil [name] MRN: [id number] Date of Birth: Jul 07, 1945 Referring Provider (SLP): Eppie Gibson, MD   Encounter Date: 03/21/2019  End of Session - 03/21/19 1530    Visit Number  5    Number of Visits  7    Date for SLP Re-Evaluation  04/25/19    SLP Start Time  0277    SLP Stop Time   4128    SLP Time Calculation (min)  43 min    Activity Tolerance  Patient tolerated treatment well       Past Medical History:  Diagnosis Date  . Atrial fibrillation ([place])   . Chronic anticoagulation    On Eliquis  . GERD (gastroesophageal reflux disease)   . Hypertension   . Joint pain     Past Surgical History:  Procedure Laterality Date  . HEMORROIDECTOMY  2004  . HERNIA REPAIR  1960  . IR GASTROSTOMY TUBE MOD SED  11/07/2018  . IR IMAGING GUIDED PORT INSERTION  11/07/2018  . IR [place] TUBE PERCUT W/FLUORO  01/01/2019  . IR US GUIDANCE  09/04/2018  . IR US GUIDE BX ASP/DRAIN  09/04/2018  . MOUTH BIOPSY  09/04/2018   right jaw  . right testical removed    . TONSILLECTOMY      There were no vitals filed for this visit.  Subjective Assessment - 03/21/19 1452    Subjective  I can only take so much Osmolite until I'm full. I'm able to drink Ensure again." Pt has 2-3 Ensure/day.    Currently in Pain?  No/denies            ADULT SLP TREATMENT - 03/21/19 1454      General Information   Behavior/Cognition  Alert;Cooperative;Pleasant mood      Dysphagia Treatment   Temperature Spikes Noted  No    Respiratory Status  Room air    Oral Cavity - Dentition  Edentulous    Treatment Methods  Therapeutic exercise;Patient/caregiver education;Skilled observation    Patient observed directly with PO's  Yes    Type of PO's observed  Thin liquids    Oral Phase Signs  & Symptoms  Other (comment)   no overt s/s with H2O   Pharyngeal Phase Signs & Symptoms  Other (comment)   no overt s/s aspiration with H2O   Other treatment/comments  Pt eating moist fish, mashed potatoes, boiled eggs, blended spaghetti sauce. Pt tried V-8 approx 4 weeks ago. Pt is doing 4 cartons/day with Ensure and minimal solid PO daily. Pt does not want applesauce for POs today. Pt reports globus feeling first thing in the morning - SLP suggested it could be saliva/secretions that have gelled/hardened overnight. Pt gags and this loosens up the secretions. SLP encouraged pt to try warm water and lemon with hard swallows and gentle throat clears. Pt's frequency of HEP has not notably improved - SLP reiterated to pt that he needed to allow best chance for WNL swallow function with BID completion. SLP req'd to provide pt usual min-mod cues with HEP      Assessment / Recommendations / Plan   Plan  Continue with current plan of care      Dysphagia Recommendations   Diet recommendations  --   as tolerated   Liquids provided via  Cup    Medication Administration  --

## 2019-03-21 NOTE — Patient Instructions (Signed)
  You will need to complete the exercises for your throat TWICE A DAY until about July 4th, then do them twice a WEEK after that, in order to maintain normal swallow function.  Continue to try new foods, think about keeping a food journal.  ======================================================  Signs of Aspiration Pneumonia   . Chest pain/tightness . Fever (can be low grade) . Cough  o With foul-smelling phlegm (sputum) o With sputum containing pus or blood o With greenish sputum . Fatigue  . Shortness of breath  . Wheezing   **IF YOU HAVE THESE SIGNS, CONTACT YOUR DOCTOR OR GO TO THE EMERGENCY DEPARTMENT OR URGENT CARE AS SOON AS POSSIBLE**

## 2019-03-29 ENCOUNTER — Telehealth: Payer: Self-pay | Admitting: Nutrition

## 2019-03-29 ENCOUNTER — Telehealth: Payer: Self-pay | Admitting: *Deleted

## 2019-03-29 NOTE — Telephone Encounter (Signed)
Patient called and wants to have his feeding tube removed. States he is not using it and it is causing him pain. Weight has been stable so far. Patient reports he is tolerating a variety of food and continues to drink 4 bottles of water daily. He is drinking at least 2 Ensure enlive daily.He is starting a food journal and tracking his protein intake. Patient states he will call the Navigator to see if his tube can be removed.

## 2019-03-30 ENCOUNTER — Encounter: Payer: Self-pay | Admitting: Radiology

## 2019-03-30 ENCOUNTER — Other Ambulatory Visit (HOSPITAL_COMMUNITY): Payer: Self-pay | Admitting: Hematology

## 2019-03-30 ENCOUNTER — Other Ambulatory Visit (HOSPITAL_COMMUNITY): Payer: Self-pay | Admitting: Radiology

## 2019-03-30 ENCOUNTER — Other Ambulatory Visit: Payer: Self-pay

## 2019-03-30 ENCOUNTER — Ambulatory Visit (HOSPITAL_COMMUNITY)
Admission: RE | Admit: 2019-03-30 | Discharge: 2019-03-30 | Disposition: A | Discharge status: Home / Self Care | Payer: Medicare Other | Source: Ambulatory Visit | Attending: Radiology | Admitting: Radiology

## 2019-03-30 DIAGNOSIS — Z431 Encounter for attention to gastrostomy: Secondary | ICD-10-CM

## 2019-03-30 HISTORY — PX: IR PATIENT EVAL TECH 0-60 MINS: IMG5564

## 2019-03-30 MED ORDER — SILVER NITRATE-POT NITRATE 75-25 % EX MISC
CUTANEOUS | Status: AC
  Filled 2019-03-30: qty 10

## 2019-03-30 NOTE — Procedures (Signed)
Pt came it today with pain an g tube insertion size.  The bumper was pulled back to evaluate the tube and site.  I could  move it in and out to make sure it moved freely in the stomach.  He had some discomfort and the skin site.  There did appear a very small granuloma starting to form on the lower insertion site.  I applied silver nitrate.  Rowe Robert PAC evaluated the site.  He confirmed that the site was not infected and no redness. The patient moved around in various positions that had caused pain previously.  He still had a little soreness at the underside where the silver nitrate had been applied.  Otherwise, the sharp pain had disappeared. He wanted the G tube removed since he has been eating for 6 weeks and hand not used the g tube in over a week.  I advised him to call his oncologist to have that discussion.  He will call if he has any further problems

## 2019-03-30 NOTE — Telephone Encounter (Signed)
Oncology Nurse Navigator Documentation  Rec'd call from Neil [name] in follow-up to his just completed conversation with RD Neil [name].  He indicated:  He has been eating/drinking exclusively by mouth since early this week, has been eating a variety of high-protein foods, drinking milk and other liquids including nutritional supplements.  Wishes to have PEG removed b/c it is somewhat painful at insertion site and he is no longer using.  Has been maintaining 225 lb weight for several weeks.  I explained the criteria for PEG removal are 100% oral intake and stable weight for 4-6 weeks.  I noted he has appt with Neil [name] 2/9 and Neil [name] 2/10.  Suggested he continue with exclusive oral intake, continue with food diary and weigh himself a couple times weekly and provide this information during appts to support discussion of PEG removal.  He agreed to plan, was confident he could manage the PEG insertion-site discomfort until he sees physicians.  Neil Orem, RN, BSN Head & Neck Oncology Nurse [facility] [phone]

## 2019-04-03 ENCOUNTER — Telehealth: Payer: Self-pay | Admitting: *Deleted

## 2019-04-03 NOTE — Telephone Encounter (Signed)
Called patient to inform of Pet Scan on 04-16-19 - arrival time- 9:30 am @ [facility], patient to be NPO- 6 hrs. prior to test, patient to receive results from Dr. Isidore Moos on 04-17-19, spoke with patient and he verified understanding these appts.

## 2019-04-05 ENCOUNTER — Ambulatory Visit (HOSPITAL_COMMUNITY): Payer: Medicare Other | Attending: Cardiology

## 2019-04-05 ENCOUNTER — Other Ambulatory Visit: Payer: Self-pay

## 2019-04-05 DIAGNOSIS — I10 Essential (primary) hypertension: Secondary | ICD-10-CM | POA: Diagnosis not present

## 2019-04-05 DIAGNOSIS — I4821 Permanent atrial fibrillation: Secondary | ICD-10-CM | POA: Diagnosis not present

## 2019-04-16 ENCOUNTER — Other Ambulatory Visit: Payer: Self-pay

## 2019-04-16 ENCOUNTER — Encounter (HOSPITAL_COMMUNITY)
Admission: RE | Admit: 2019-04-16 | Discharge: 2019-04-16 | Disposition: A | Discharge status: Home / Self Care | Payer: Medicare Other | Source: Ambulatory Visit | Attending: Radiation Oncology | Admitting: Radiation Oncology

## 2019-04-16 DIAGNOSIS — K8020 Calculus of gallbladder without cholecystitis without obstruction: Secondary | ICD-10-CM | POA: Insufficient documentation

## 2019-04-16 DIAGNOSIS — K5730 Diverticulosis of large intestine without perforation or abscess without bleeding: Secondary | ICD-10-CM | POA: Diagnosis not present

## 2019-04-16 DIAGNOSIS — I2510 Atherosclerotic heart disease of native coronary artery without angina pectoris: Secondary | ICD-10-CM | POA: Insufficient documentation

## 2019-04-16 DIAGNOSIS — C01 Malignant neoplasm of base of tongue: Secondary | ICD-10-CM | POA: Diagnosis not present

## 2019-04-16 DIAGNOSIS — N400 Enlarged prostate without lower urinary tract symptoms: Secondary | ICD-10-CM | POA: Diagnosis not present

## 2019-04-16 DIAGNOSIS — I700 Atherosclerosis of aorta: Secondary | ICD-10-CM | POA: Diagnosis not present

## 2019-04-16 DIAGNOSIS — C029 Malignant neoplasm of tongue, unspecified: Secondary | ICD-10-CM | POA: Diagnosis not present

## 2019-04-16 LAB — GLUCOSE, CAPILLARY: Glucose-Capillary: 94 mg/dL (ref 70–99)

## 2019-04-16 MED ORDER — FLUDEOXYGLUCOSE F - 18 (FDG) INJECTION
11.2000 | Freq: Once | INTRAVENOUS | Status: AC | PRN
  Administered 2019-04-16: 11.2 via INTRAVENOUS

## 2019-04-16 NOTE — Progress Notes (Signed)
Radiation Oncology         [phone]) [phone] ________________________________  Name: Alonte Holdaway MRN: [id number]  Date: 04/17/2019  DOB: 01-Jun-1945  Follow-Up Visit Note in person  CC: Bartholome Bill, MD  Lind Guest MD  Diagnosis and Prior Radiotherapy:       ICD-10-CM   1. Screening for hypothyroidism  Z13.29 TSH  2. Cancer of base of tongue ([place])  C01 TSH    Ambulatory referral to Physical Therapy    NM PET Image Restag (PS) Skull Base To Thigh  3. Fatigue, unspecified type  R53.83 TSH    11/16/2018 through 01/03/2019 Site Technique Total Dose (Gy) Dose per Fx (Gy) Completed Fx Beam Energies  Head & neck: HN_BOT IMRT 70/70 2 35/35 6X    CHIEF COMPLAINT:  Here for follow-up and surveillance of base of tongue cancer  Narrative:  The patient returns today for routine follow-up and to review recent scans. He is scheduled to see Dr. Maylon Peppers tomorrow, 04/17/2018.  PET scan was performed yesterday, 04/16/2019, showing: resolution of previously hypermetabolic activity along right tongue base; significant reduction in size and activity of right lateral cervical node, still abnormally high activity but improved from prior; no distant metastatic spread.  I have personally reviewed his images.  Pain issues, if any: He denies significant pain but has some throat soreness when he is coughing up phlegm.   Using a feeding tube?:  He has not used his PEG tube for 3 weeks  Weight changes, if any: Maintaining weight Wt Readings from Last 3 Encounters:  04/17/19 226 lb 3.2 oz (102.6 kg)  02/21/19 229 lb 3.2 oz (104 kg)  01/17/19 229 lb 2 oz (103.9 kg)   Swallowing issues, if any: Swallowing well  Smoking or chewing tobacco? No  Last ENT visit was on: Not since diagnosis.    Other notable issues, if any:  He continues to report taste changes. He does like chocolate milk and cookies. He is eating beef, chicken, and other foods of different types of textures. He continues to drink boost  daily. He is eating smaller portions.  ALLERGIES:  is allergic to lisinopril.  Meds: Current Outpatient Medications  Medication Sig Dispense Refill  . Apixaban (ELIQUIS PO) Take 5 mg by mouth 2 (two) times daily.     . hydrochlorothiazide (HYDRODIURIL) 25 MG tablet Take 25 mg by mouth daily.    . metoprolol succinate (TOPROL-XL) 100 MG 24 hr tablet Take 1 tablet (100 mg total) by mouth daily. Take with or immediately following a meal. (Patient taking differently: Take 50 mg by mouth daily. Take with or immediately following a meal.) 90 tablet 3  . Nutritional Supplements (FEEDING SUPPLEMENT, OSMOLITE 1.5 CAL,) LIQD 1 1/2 bottles Osmolite 1.5 via PEG with 60 mL water before and after each bolus feeding QID. 1 OZ Prosource or equivalent via PEG QID with 60 mL water flush after. Provides 2530 kcal, 129 gm Pro: >90% estimated needs. Please send formula, Prosource and supplies. 1422 mL 6  . omeprazole (PRILOSEC) 20 MG capsule Take 20 mg by mouth daily.    . potassium chloride SA (KLOR-CON) 20 MEQ tablet Take 1 tablet (20 mEq total) by mouth daily for 5 days. 5 tablet 0  . tamsulosin (FLOMAX) 0.4 MG CAPS capsule Take 0.4 mg by mouth daily after supper.     No current facility-administered medications for this encounter.    Physical Findings: The patient is in no acute distress. Patient is alert and oriented. Wt Readings  in and out to make sure it moved freely in the stomach.  He had some discomfort and the skin site.  There did appear a very small granuloma starting to form on the lower insertion site.  I applied silver nitrate.  Rowe Robert PAC evaluated the site.  He confirmed that the site was not infected and no redness. The patient moved around in various positions that had caused pain previously.  He still had a little soreness at the underside where the silver nitrate had been applied.  Otherwise, the sharp pain had disappeared. He wanted the G tube removed since he has been eating for 6 weeks and hand not used the g tube in over a week.  I advised him  to call his oncologist to have that discussion.  He will call if he has any further problems  ECHOCARDIOGRAM COMPLETE  Result Date: 04/05/2019   ECHOCARDIOGRAM REPORT   Patient Name:   Neil [name]   Date of Exam: 04/05/2019 Medical Rec #:  [id number]     Height:       70.0 in Accession #:    [id number]    Weight:       229.2 lb Date of Birth:  10-31-1945     BSA:          2.21 m Patient Age:    10 years      BP:           139/72 mmHg Patient Gender: M             HR:           69 bpm. Exam Location:  [facility] Procedure: 2D Echo, Cardiac Doppler and Color Doppler Indications:    I48.91 Atrial Fibrillation  History:        Patient has no prior history of Echocardiogram examinations.                 Squamous cell carcinoma of the tongue; Risk                 Factors:Hypertension.  Sonographer:    Marygrace Drought RCS Referring Phys: [id number] Kirkman  1. Left ventricular ejection fraction, by visual estimation, is 55 to 60%. The left ventricle has normal function. There is mildly increased left ventricular hypertrophy.  2. Left ventricular diastolic function could not be evaluated.  3. The left ventricle has no regional wall motion abnormalities.  4. Global right ventricle has normal systolic function.The right ventricular size is normal.  5. Left atrial size was moderately dilated.  6. Right atrial size was mildly dilated.  7. The mitral valve is normal in structure. Mild mitral valve regurgitation. No evidence of mitral stenosis.  8. The tricuspid valve is normal in structure. Tricuspid valve regurgitation is mild.  9. The aortic valve is tricuspid. Aortic valve regurgitation is mild. Mild aortic valve sclerosis without stenosis. 10. The pulmonic valve was grossly normal. Pulmonic valve regurgitation is trivial. 11. Aortic dilatation noted. 12. There is mild dilatation of the ascending aorta measuring 40 mm. 13. The inferior vena cava is normal in size with greater than 50% respiratory  variability, suggesting right atrial pressure of 3 mmHg. 14. Normal LV function; mild LVH; mildly dilated ascending aorta; mild AI and MR; biatrial enlargement. FINDINGS  Left Ventricle: Left ventricular ejection fraction, by visual estimation, is 55 to 60%. The left ventricle has normal function. The left ventricle has no regional wall motion abnormalities. There is mildly increased  Radiation Oncology         [phone]) [phone] ________________________________  Name: Alonte Holdaway MRN: [id number]  Date: 04/17/2019  DOB: 01-Jun-1945  Follow-Up Visit Note in person  CC: Bartholome Bill, MD  Lind Guest MD  Diagnosis and Prior Radiotherapy:       ICD-10-CM   1. Screening for hypothyroidism  Z13.29 TSH  2. Cancer of base of tongue ([place])  C01 TSH    Ambulatory referral to Physical Therapy    NM PET Image Restag (PS) Skull Base To Thigh  3. Fatigue, unspecified type  R53.83 TSH    11/16/2018 through 01/03/2019 Site Technique Total Dose (Gy) Dose per Fx (Gy) Completed Fx Beam Energies  Head & neck: HN_BOT IMRT 70/70 2 35/35 6X    CHIEF COMPLAINT:  Here for follow-up and surveillance of base of tongue cancer  Narrative:  The patient returns today for routine follow-up and to review recent scans. He is scheduled to see Dr. Maylon Peppers tomorrow, 04/17/2018.  PET scan was performed yesterday, 04/16/2019, showing: resolution of previously hypermetabolic activity along right tongue base; significant reduction in size and activity of right lateral cervical node, still abnormally high activity but improved from prior; no distant metastatic spread.  I have personally reviewed his images.  Pain issues, if any: He denies significant pain but has some throat soreness when he is coughing up phlegm.   Using a feeding tube?:  He has not used his PEG tube for 3 weeks  Weight changes, if any: Maintaining weight Wt Readings from Last 3 Encounters:  04/17/19 226 lb 3.2 oz (102.6 kg)  02/21/19 229 lb 3.2 oz (104 kg)  01/17/19 229 lb 2 oz (103.9 kg)   Swallowing issues, if any: Swallowing well  Smoking or chewing tobacco? No  Last ENT visit was on: Not since diagnosis.    Other notable issues, if any:  He continues to report taste changes. He does like chocolate milk and cookies. He is eating beef, chicken, and other foods of different types of textures. He continues to drink boost  daily. He is eating smaller portions.  ALLERGIES:  is allergic to lisinopril.  Meds: Current Outpatient Medications  Medication Sig Dispense Refill  . Apixaban (ELIQUIS PO) Take 5 mg by mouth 2 (two) times daily.     . hydrochlorothiazide (HYDRODIURIL) 25 MG tablet Take 25 mg by mouth daily.    . metoprolol succinate (TOPROL-XL) 100 MG 24 hr tablet Take 1 tablet (100 mg total) by mouth daily. Take with or immediately following a meal. (Patient taking differently: Take 50 mg by mouth daily. Take with or immediately following a meal.) 90 tablet 3  . Nutritional Supplements (FEEDING SUPPLEMENT, OSMOLITE 1.5 CAL,) LIQD 1 1/2 bottles Osmolite 1.5 via PEG with 60 mL water before and after each bolus feeding QID. 1 OZ Prosource or equivalent via PEG QID with 60 mL water flush after. Provides 2530 kcal, 129 gm Pro: >90% estimated needs. Please send formula, Prosource and supplies. 1422 mL 6  . omeprazole (PRILOSEC) 20 MG capsule Take 20 mg by mouth daily.    . potassium chloride SA (KLOR-CON) 20 MEQ tablet Take 1 tablet (20 mEq total) by mouth daily for 5 days. 5 tablet 0  . tamsulosin (FLOMAX) 0.4 MG CAPS capsule Take 0.4 mg by mouth daily after supper.     No current facility-administered medications for this encounter.    Physical Findings: The patient is in no acute distress. Patient is alert and oriented. Wt Readings  Radiation Oncology         [phone]) [phone] ________________________________  Name: Alonte Holdaway MRN: [id number]  Date: 04/17/2019  DOB: 01-Jun-1945  Follow-Up Visit Note in person  CC: Bartholome Bill, MD  Lind Guest MD  Diagnosis and Prior Radiotherapy:       ICD-10-CM   1. Screening for hypothyroidism  Z13.29 TSH  2. Cancer of base of tongue ([place])  C01 TSH    Ambulatory referral to Physical Therapy    NM PET Image Restag (PS) Skull Base To Thigh  3. Fatigue, unspecified type  R53.83 TSH    11/16/2018 through 01/03/2019 Site Technique Total Dose (Gy) Dose per Fx (Gy) Completed Fx Beam Energies  Head & neck: HN_BOT IMRT 70/70 2 35/35 6X    CHIEF COMPLAINT:  Here for follow-up and surveillance of base of tongue cancer  Narrative:  The patient returns today for routine follow-up and to review recent scans. He is scheduled to see Dr. Maylon Peppers tomorrow, 04/17/2018.  PET scan was performed yesterday, 04/16/2019, showing: resolution of previously hypermetabolic activity along right tongue base; significant reduction in size and activity of right lateral cervical node, still abnormally high activity but improved from prior; no distant metastatic spread.  I have personally reviewed his images.  Pain issues, if any: He denies significant pain but has some throat soreness when he is coughing up phlegm.   Using a feeding tube?:  He has not used his PEG tube for 3 weeks  Weight changes, if any: Maintaining weight Wt Readings from Last 3 Encounters:  04/17/19 226 lb 3.2 oz (102.6 kg)  02/21/19 229 lb 3.2 oz (104 kg)  01/17/19 229 lb 2 oz (103.9 kg)   Swallowing issues, if any: Swallowing well  Smoking or chewing tobacco? No  Last ENT visit was on: Not since diagnosis.    Other notable issues, if any:  He continues to report taste changes. He does like chocolate milk and cookies. He is eating beef, chicken, and other foods of different types of textures. He continues to drink boost  daily. He is eating smaller portions.  ALLERGIES:  is allergic to lisinopril.  Meds: Current Outpatient Medications  Medication Sig Dispense Refill  . Apixaban (ELIQUIS PO) Take 5 mg by mouth 2 (two) times daily.     . hydrochlorothiazide (HYDRODIURIL) 25 MG tablet Take 25 mg by mouth daily.    . metoprolol succinate (TOPROL-XL) 100 MG 24 hr tablet Take 1 tablet (100 mg total) by mouth daily. Take with or immediately following a meal. (Patient taking differently: Take 50 mg by mouth daily. Take with or immediately following a meal.) 90 tablet 3  . Nutritional Supplements (FEEDING SUPPLEMENT, OSMOLITE 1.5 CAL,) LIQD 1 1/2 bottles Osmolite 1.5 via PEG with 60 mL water before and after each bolus feeding QID. 1 OZ Prosource or equivalent via PEG QID with 60 mL water flush after. Provides 2530 kcal, 129 gm Pro: >90% estimated needs. Please send formula, Prosource and supplies. 1422 mL 6  . omeprazole (PRILOSEC) 20 MG capsule Take 20 mg by mouth daily.    . potassium chloride SA (KLOR-CON) 20 MEQ tablet Take 1 tablet (20 mEq total) by mouth daily for 5 days. 5 tablet 0  . tamsulosin (FLOMAX) 0.4 MG CAPS capsule Take 0.4 mg by mouth daily after supper.     No current facility-administered medications for this encounter.    Physical Findings: The patient is in no acute distress. Patient is alert and oriented. Wt Readings  Radiation Oncology         [phone]) [phone] ________________________________  Name: Alonte Holdaway MRN: [id number]  Date: 04/17/2019  DOB: 01-Jun-1945  Follow-Up Visit Note in person  CC: Bartholome Bill, MD  Lind Guest MD  Diagnosis and Prior Radiotherapy:       ICD-10-CM   1. Screening for hypothyroidism  Z13.29 TSH  2. Cancer of base of tongue ([place])  C01 TSH    Ambulatory referral to Physical Therapy    NM PET Image Restag (PS) Skull Base To Thigh  3. Fatigue, unspecified type  R53.83 TSH    11/16/2018 through 01/03/2019 Site Technique Total Dose (Gy) Dose per Fx (Gy) Completed Fx Beam Energies  Head & neck: HN_BOT IMRT 70/70 2 35/35 6X    CHIEF COMPLAINT:  Here for follow-up and surveillance of base of tongue cancer  Narrative:  The patient returns today for routine follow-up and to review recent scans. He is scheduled to see Dr. Maylon Peppers tomorrow, 04/17/2018.  PET scan was performed yesterday, 04/16/2019, showing: resolution of previously hypermetabolic activity along right tongue base; significant reduction in size and activity of right lateral cervical node, still abnormally high activity but improved from prior; no distant metastatic spread.  I have personally reviewed his images.  Pain issues, if any: He denies significant pain but has some throat soreness when he is coughing up phlegm.   Using a feeding tube?:  He has not used his PEG tube for 3 weeks  Weight changes, if any: Maintaining weight Wt Readings from Last 3 Encounters:  04/17/19 226 lb 3.2 oz (102.6 kg)  02/21/19 229 lb 3.2 oz (104 kg)  01/17/19 229 lb 2 oz (103.9 kg)   Swallowing issues, if any: Swallowing well  Smoking or chewing tobacco? No  Last ENT visit was on: Not since diagnosis.    Other notable issues, if any:  He continues to report taste changes. He does like chocolate milk and cookies. He is eating beef, chicken, and other foods of different types of textures. He continues to drink boost  daily. He is eating smaller portions.  ALLERGIES:  is allergic to lisinopril.  Meds: Current Outpatient Medications  Medication Sig Dispense Refill  . Apixaban (ELIQUIS PO) Take 5 mg by mouth 2 (two) times daily.     . hydrochlorothiazide (HYDRODIURIL) 25 MG tablet Take 25 mg by mouth daily.    . metoprolol succinate (TOPROL-XL) 100 MG 24 hr tablet Take 1 tablet (100 mg total) by mouth daily. Take with or immediately following a meal. (Patient taking differently: Take 50 mg by mouth daily. Take with or immediately following a meal.) 90 tablet 3  . Nutritional Supplements (FEEDING SUPPLEMENT, OSMOLITE 1.5 CAL,) LIQD 1 1/2 bottles Osmolite 1.5 via PEG with 60 mL water before and after each bolus feeding QID. 1 OZ Prosource or equivalent via PEG QID with 60 mL water flush after. Provides 2530 kcal, 129 gm Pro: >90% estimated needs. Please send formula, Prosource and supplies. 1422 mL 6  . omeprazole (PRILOSEC) 20 MG capsule Take 20 mg by mouth daily.    . potassium chloride SA (KLOR-CON) 20 MEQ tablet Take 1 tablet (20 mEq total) by mouth daily for 5 days. 5 tablet 0  . tamsulosin (FLOMAX) 0.4 MG CAPS capsule Take 0.4 mg by mouth daily after supper.     No current facility-administered medications for this encounter.    Physical Findings: The patient is in no acute distress. Patient is alert and oriented. Wt Readings  in and out to make sure it moved freely in the stomach.  He had some discomfort and the skin site.  There did appear a very small granuloma starting to form on the lower insertion site.  I applied silver nitrate.  Rowe Robert PAC evaluated the site.  He confirmed that the site was not infected and no redness. The patient moved around in various positions that had caused pain previously.  He still had a little soreness at the underside where the silver nitrate had been applied.  Otherwise, the sharp pain had disappeared. He wanted the G tube removed since he has been eating for 6 weeks and hand not used the g tube in over a week.  I advised him  to call his oncologist to have that discussion.  He will call if he has any further problems  ECHOCARDIOGRAM COMPLETE  Result Date: 04/05/2019   ECHOCARDIOGRAM REPORT   Patient Name:   Neil [name]   Date of Exam: 04/05/2019 Medical Rec #:  [id number]     Height:       70.0 in Accession #:    [id number]    Weight:       229.2 lb Date of Birth:  10-31-1945     BSA:          2.21 m Patient Age:    10 years      BP:           139/72 mmHg Patient Gender: M             HR:           69 bpm. Exam Location:  [facility] Procedure: 2D Echo, Cardiac Doppler and Color Doppler Indications:    I48.91 Atrial Fibrillation  History:        Patient has no prior history of Echocardiogram examinations.                 Squamous cell carcinoma of the tongue; Risk                 Factors:Hypertension.  Sonographer:    Marygrace Drought RCS Referring Phys: [id number] Kirkman  1. Left ventricular ejection fraction, by visual estimation, is 55 to 60%. The left ventricle has normal function. There is mildly increased left ventricular hypertrophy.  2. Left ventricular diastolic function could not be evaluated.  3. The left ventricle has no regional wall motion abnormalities.  4. Global right ventricle has normal systolic function.The right ventricular size is normal.  5. Left atrial size was moderately dilated.  6. Right atrial size was mildly dilated.  7. The mitral valve is normal in structure. Mild mitral valve regurgitation. No evidence of mitral stenosis.  8. The tricuspid valve is normal in structure. Tricuspid valve regurgitation is mild.  9. The aortic valve is tricuspid. Aortic valve regurgitation is mild. Mild aortic valve sclerosis without stenosis. 10. The pulmonic valve was grossly normal. Pulmonic valve regurgitation is trivial. 11. Aortic dilatation noted. 12. There is mild dilatation of the ascending aorta measuring 40 mm. 13. The inferior vena cava is normal in size with greater than 50% respiratory  variability, suggesting right atrial pressure of 3 mmHg. 14. Normal LV function; mild LVH; mildly dilated ascending aorta; mild AI and MR; biatrial enlargement. FINDINGS  Left Ventricle: Left ventricular ejection fraction, by visual estimation, is 55 to 60%. The left ventricle has normal function. The left ventricle has no regional wall motion abnormalities. There is mildly increased

## 2019-04-17 ENCOUNTER — Other Ambulatory Visit: Payer: Self-pay

## 2019-04-17 ENCOUNTER — Inpatient Hospital Stay
Admission: RE | Admit: 2019-04-17 | Discharge: 2019-04-17 | Disposition: A | Discharge status: Home / Self Care | Payer: Medicare Other | Source: Ambulatory Visit | Attending: Radiation Oncology | Admitting: Radiation Oncology

## 2019-04-17 ENCOUNTER — Encounter: Payer: Self-pay | Admitting: *Deleted

## 2019-04-17 ENCOUNTER — Encounter: Payer: Self-pay | Admitting: Radiation Oncology

## 2019-04-17 VITALS — BP 133/79 | HR 58 | Temp 98.7°F | Resp 20 | Wt 226.2 lb

## 2019-04-17 DIAGNOSIS — R221 Localized swelling, mass and lump, neck: Secondary | ICD-10-CM | POA: Diagnosis not present

## 2019-04-17 DIAGNOSIS — Z08 Encounter for follow-up examination after completed treatment for malignant neoplasm: Secondary | ICD-10-CM | POA: Diagnosis not present

## 2019-04-17 DIAGNOSIS — Z1329 Encounter for screening for other suspected endocrine disorder: Secondary | ICD-10-CM

## 2019-04-17 DIAGNOSIS — N400 Enlarged prostate without lower urinary tract symptoms: Secondary | ICD-10-CM | POA: Diagnosis not present

## 2019-04-17 DIAGNOSIS — I700 Atherosclerosis of aorta: Secondary | ICD-10-CM | POA: Diagnosis not present

## 2019-04-17 DIAGNOSIS — K8020 Calculus of gallbladder without cholecystitis without obstruction: Secondary | ICD-10-CM | POA: Diagnosis not present

## 2019-04-17 DIAGNOSIS — R5383 Other fatigue: Secondary | ICD-10-CM | POA: Diagnosis not present

## 2019-04-17 DIAGNOSIS — K5730 Diverticulosis of large intestine without perforation or abscess without bleeding: Secondary | ICD-10-CM | POA: Diagnosis not present

## 2019-04-17 DIAGNOSIS — I2510 Atherosclerotic heart disease of native coronary artery without angina pectoris: Secondary | ICD-10-CM | POA: Diagnosis not present

## 2019-04-17 DIAGNOSIS — C01 Malignant neoplasm of base of tongue: Secondary | ICD-10-CM | POA: Diagnosis not present

## 2019-04-17 HISTORY — DX: Personal history of irradiation: Z92.3

## 2019-04-17 NOTE — Progress Notes (Signed)
[name] presents for follow up of radiation completed 01/03/19 to his base of tongue and bilateral neck nodes.   Pain issues, if any: He reports pain at his PEG site.  Using a feeding tube?: Yes, he has not used it since 03/27/19. Weight changes, if any:  Wt Readings from Last 3 Encounters:  04/17/19 226 lb 3.2 oz (102.6 kg)  02/21/19 229 lb 3.2 oz (104 kg)  01/17/19 229 lb 2 oz (103.9 kg)   Swallowing issues, if any: He continues to report taste changes. He does like chocolate milk and cookies. He is eating beef, chicken, and other foods of different types of textures. He continues to drink boost daily. He is eating smaller portions.  Smoking or chewing tobacco? No Using fluoride trays daily? He is using fluoride toothpaste.  Last ENT visit was on: Dr. Blenda Nicely, not since diagnosis.  Other notable issues, if any: 04/16/19 PET. He is here to discuss results today.  Dr. Maylon Peppers 02/21/19 next appointment tomorrow 04/18/19  BP 133/79 (BP Location: Left Arm, Patient Position: Sitting, Cuff Size: Normal)   Pulse (!) 58   Temp 98.7 F (37.1 C)   Resp 20   Wt 226 lb 3.2 oz (102.6 kg)   SpO2 100%   BMI 32.46 kg/m

## 2019-04-18 ENCOUNTER — Inpatient Hospital Stay (HOSPITAL_BASED_OUTPATIENT_CLINIC_OR_DEPARTMENT_OTHER): Payer: Medicare Other | Admitting: Hematology

## 2019-04-18 ENCOUNTER — Inpatient Hospital Stay: Payer: Medicare Other | Attending: Hematology

## 2019-04-18 ENCOUNTER — Inpatient Hospital Stay: Payer: Medicare Other

## 2019-04-18 ENCOUNTER — Encounter: Payer: Self-pay | Admitting: Hematology

## 2019-04-18 VITALS — BP 134/69 | HR 61 | Temp 98.3°F | Resp 18 | Ht 70.0 in | Wt 223.6 lb

## 2019-04-18 DIAGNOSIS — Z7901 Long term (current) use of anticoagulants: Secondary | ICD-10-CM | POA: Diagnosis not present

## 2019-04-18 DIAGNOSIS — Z85810 Personal history of malignant neoplasm of tongue: Secondary | ICD-10-CM | POA: Insufficient documentation

## 2019-04-18 DIAGNOSIS — C01 Malignant neoplasm of base of tongue: Secondary | ICD-10-CM | POA: Diagnosis not present

## 2019-04-18 DIAGNOSIS — N183 Chronic kidney disease, stage 3 unspecified: Secondary | ICD-10-CM | POA: Diagnosis not present

## 2019-04-18 DIAGNOSIS — I4891 Unspecified atrial fibrillation: Secondary | ICD-10-CM | POA: Insufficient documentation

## 2019-04-18 DIAGNOSIS — E041 Nontoxic single thyroid nodule: Secondary | ICD-10-CM | POA: Insufficient documentation

## 2019-04-18 DIAGNOSIS — Z452 Encounter for adjustment and management of vascular access device: Secondary | ICD-10-CM | POA: Insufficient documentation

## 2019-04-18 DIAGNOSIS — E0789 Other specified disorders of thyroid: Secondary | ICD-10-CM

## 2019-04-18 DIAGNOSIS — Z95828 Presence of other vascular implants and grafts: Secondary | ICD-10-CM

## 2019-04-18 LAB — CMP (CANCER CENTER ONLY)
ALT: 8 U/L (ref 0–44)
AST: 12 U/L — ABNORMAL LOW (ref 15–41)
Albumin: 3.8 g/dL (ref 3.5–5.0)
Alkaline Phosphatase: 39 U/L (ref 38–126)
Anion gap: 11 (ref 5–15)
BUN: 23 mg/dL (ref 8–23)
CO2: 27 mmol/L (ref 22–32)
Calcium: 8.6 mg/dL — ABNORMAL LOW (ref 8.9–10.3)
Chloride: 101 mmol/L (ref 98–111)
Creatinine: 0.97 mg/dL (ref 0.61–1.24)
GFR, Est AFR Am: >60 mL/min (ref >60)
GFR, Estimated: >60 mL/min (ref >60)
Glucose, Bld: 98 mg/dL (ref 70–99)
Potassium: 3.1 mmol/L — ABNORMAL LOW (ref 3.5–5.1)
Sodium: 139 mmol/L (ref 135–145)
Total Bilirubin: 0.7 mg/dL (ref 0.3–1.2)
Total Protein: 6.5 g/dL (ref 6.5–8.1)

## 2019-04-18 LAB — CBC WITH DIFFERENTIAL (CANCER CENTER ONLY)
Abs Immature Granulocytes: 0.02 10*3/uL (ref 0.00–0.07)
Basophils Absolute: 0 10*3/uL (ref 0.0–0.1)
Basophils Relative: 1 %
Eosinophils Absolute: 0 10*3/uL (ref 0.0–0.5)
Eosinophils Relative: 1 %
HCT: 43 % (ref 39.0–52.0)
Hemoglobin: 14.8 g/dL (ref 13.0–17.0)
Immature Granulocytes: 1 %
Lymphocytes Relative: 9 %
Lymphs Abs: 0.4 10*3/uL — ABNORMAL LOW (ref 0.7–4.0)
MCH: 31.6 pg (ref 26.0–34.0)
MCHC: 34.4 g/dL (ref 30.0–36.0)
MCV: 91.7 fL (ref 80.0–100.0)
Monocytes Absolute: 0.4 10*3/uL (ref 0.1–1.0)
Monocytes Relative: 10 %
Neutro Abs: 3.5 10*3/uL (ref 1.7–7.7)
Neutrophils Relative %: 78 %
Platelet Count: 154 10*3/uL (ref 150–400)
RBC: 4.69 MIL/uL (ref 4.22–5.81)
RDW: 12.9 % (ref 11.5–15.5)
WBC Count: 4.4 10*3/uL (ref 4.0–10.5)
nRBC: 0 % (ref 0.0–0.2)

## 2019-04-18 LAB — TSH: TSH: 0.528 u[IU]/mL (ref 0.320–4.118)

## 2019-04-18 MED ORDER — HEPARIN SOD (PORK) LOCK FLUSH 100 UNIT/ML IV SOLN
500.0000 [IU] | Freq: Once | INTRAVENOUS | Status: AC | PRN
  Administered 2019-04-18: 14:00:00 500 [IU]
  Filled 2019-04-18: qty 5

## 2019-04-18 MED ORDER — SODIUM CHLORIDE 0.9% FLUSH
10.0000 mL | INTRAVENOUS | Status: DC | PRN
  Administered 2019-04-18: 10 mL
  Filled 2019-04-18: qty 10

## 2019-04-18 NOTE — Progress Notes (Signed)
[name] OFFICE PROGRESS NOTE  Patient Care Team: Verlon Au, MD as PCP - General (Family Medicine) O'Neal, Ronnald Ramp, MD as PCP - Cardiology (Cardiology) Lonie Peak, MD as Attending Physician (Radiation Oncology) Barrie Folk, RN as Oncology Nurse Navigator Schinke, Karie Georges, CCC-SLP as Speech Language Pathologist (Speech Pathology) Anabel Bene, RD as Dietitian (Nutrition) Ernestene Mention, LCSW as Social Worker Graylin Shiver, MD as Consulting Physician (Otolaryngology) Arthur Holms, MD as Consulting Physician (Hematology)  HEME/ONC OVERVIEW: 1. 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 A-fib on Eliquis   2. 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  R cervical LN substantially reduced in size to 0.8cm w/ SUV 4.7 (prev 1.7cm w/ SUV 5.8).  There was no new or metastatic disease.  -I reviewed the imaging results in detail with the patient  -Clinically, there is no evidence of palpable cervical adenopathy -I will discuss PET results with  Dr. Basilio Cairo and review it at the next H&N tumor board.  If the R cervical LN is not suspicious, then we can plan to repeat PET in 4 months (early 08/2019) to monitor for any interval changes.  However, if the LN appears suspicious, then we can pursue US-guided LN biopsy to rule out residual disease.  -I spent some time counseling patient on the importance of jaw and shoulder exercises to maintain mobility -I also emphasized the importance of ENT surveillance to monitor for any evidence of recurrent disease -In the absence of any definite recurrent or residual disease, I have ordered port and PEG tube removal -Periodic TSH monitoring for any evidence of radiation-induced thyroid dysfunction   Orders Placed This Encounter  Procedures  . IR REMOVAL TUN ACCESS W/ PORT W/O FL MOD SED    Standing Status:   Future    Standing Expiration Date:   06/15/2020    Order Specific Question:   Reason for exam:    Answer:   COmpleted chemoRT    Order Specific Question:   Preferred Imaging Location?    Answer:   [facility]  . IR GASTROSTOMY TUBE REMOVAL/REPAIR    Standing Status:   Future    Standing Expiration Date:   06/15/2020    Order Specific Question:   Reason for Exam (SYMPTOM  OR DIAGNOSIS REQUIRED)    Answer:   Completed chemoRT    Order Specific Question:   Preferred Imaging Location?    Answer:   [facility]    The total time spent in the encounter was 35 minutes, including  face-to-face time with the patient, review of various tests results, order additional studies/medications, documentation, and coordination of care plan.   All questions were answered. The patient knows to call the clinic with any problems, questions or concerns. No barriers to learning was detected.  Return in 4 months for labs and clinic follow-up with H&N survivorship clinic.   Arthur Holms, MD 2/10/20212:43 PM  CHIEF COMPLAINT: "I am doing great"  INTERVAL HISTORY: Neil [name] returns clinic for  follow-up of squamous cell carcinoma of the right base of the tongue s/p definitive chemoradiation.  Patient reports that he has been able drink without any limitation, and his weight has been stable.  He has not used his feeding tube since mid-03/2019.  His taste is overall improving.  He denies any other complaint today.  REVIEW OF SYSTEMS:   Constitutional: ( - ) fevers, ( - )  chills , ( - ) night sweats Eyes: ( - ) blurriness of vision, ( - ) double vision, ( - ) watery eyes Ears, nose, mouth, throat, and face: ( - ) mucositis, ( - ) sore throat Respiratory: ( - ) cough, ( - ) dyspnea, ( - ) wheezes Cardiovascular: ( - ) palpitation, ( - ) chest discomfort, ( - ) lower extremity swelling Gastrointestinal:  ( - ) nausea, ( - ) heartburn, ( - ) change in bowel habits Skin: ( - ) abnormal skin rashes Lymphatics: ( - ) new lymphadenopathy, ( - ) easy bruising Neurological: ( - ) numbness, ( - ) tingling, ( - ) new weaknesses Behavioral/Psych: ( - ) mood change, ( - ) new changes  All other systems were reviewed with the patient and are negative.  SUMMARY OF ONCOLOGIC HISTORY: Oncology History  Cancer of base of tongue (HCC)  07/29/2018 Imaging   Neck ultrasound: IMPRESSION: 16 x 30 mm solid mass in the right submandibular area. This may represent a submandibular mass or enlarged lymph node. Recommend CT neck with contrast for further evaluation.   08/03/2018 Imaging   CT neck (at [facility]): IMPRESSION: 1. 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 There is a single ipsilateral malignant lymph node measuring 3 cm. 2. Nodular thickening along the anterior right sublingual gland, attention on follow-up PET.   09/04/2018 Procedure   US-guided bx of the R submandibular mass   09/04/2018 Pathology Results   Accession: [id number]  Lymph node, needle/core biopsy, right submandibular - SQUAMOUS CELL CARCINOMA. - SEE  MICROSCOPIC DESCRIPTION.   09/11/2018 Imaging   PET: IMPRESSION: Hypermetabolism along the right base of tongue, corresponding to the patient's known primary oropharyngeal cancer.   Ipsilateral level 2 cervical nodal metastasis.   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   11/16/2018 -  Chemotherapy   The patient had palonosetron (ALOXI) injection 0.25 mg, 0.25 mg, Intravenous,  Once, 6 of 6 cycles Administration: 0.25 mg (11/16/2018), 0.25 mg (11/23/2018), 0.25 mg (11/30/2018), 0.25 mg (12/07/2018), 0.25 mg (12/14/2018), 0.25 mg (12/28/2018) CARBOplatin (PARAPLATIN) 200 mg in sodium chloride 0.9 % 250 mL chemo infusion, 200 mg (100 % of original dose 202.8 mg), Intravenous,  Once, 6 of 6 cycles Dose modification: 202.8 mg (original dose 202.8 mg, Cycle 1), 260 mg (original dose 202.8 mg, Cycle 6, Reason: Provider Judgment) Administration: 200 mg (11/16/2018), 200 mg (11/23/2018), 260 mg (11/30/2018), 260 mg (12/07/2018), 260 mg (12/14/2018), 260 mg (12/28/2018)  for chemotherapy treatment.  I have reviewed the past medical history, past surgical history, social history and family history with the patient and they are unchanged from previous note.  ALLERGIES:  is allergic to lisinopril.  MEDICATIONS:  Current Outpatient Medications  Medication Sig Dispense Refill  . Apixaban (ELIQUIS PO) Take 5 mg by mouth 2 (two) times daily.     . hydrochlorothiazide (HYDRODIURIL) 25 MG tablet Take 25 mg by mouth daily.    . metoprolol succinate (TOPROL-XL) 100 MG 24 hr tablet Take 1 tablet (100 mg total) by mouth daily. Take with or immediately following a meal. (Patient taking differently: Take 50 mg by mouth daily. Take with or immediately following a meal.) 90 tablet 3  . omeprazole (PRILOSEC) 20 MG capsule Take 20 mg by mouth daily.    . tamsulosin (FLOMAX) 0.4 MG CAPS capsule Take 0.4 mg by mouth daily after  supper.    . Nutritional Supplements (FEEDING SUPPLEMENT, OSMOLITE 1.5 CAL,) LIQD 1 1/2 bottles Osmolite 1.5 via PEG with 60 mL water before and after each bolus feeding QID. 1 OZ Prosource or equivalent via PEG QID with 60 mL water flush after. Provides 2530 kcal, 129 gm Pro: >90% estimated needs. Please send formula, Prosource and supplies. (Patient not taking: Reported on 04/18/2019) 1422 mL 6  . potassium chloride SA (KLOR-CON) 20 MEQ tablet Take 1 tablet (20 mEq total) by mouth daily for 5 days. 5 tablet 0   No current facility-administered medications for this visit.    PHYSICAL EXAMINATION: ECOG PERFORMANCE STATUS: 1 - Symptomatic but completely ambulatory  Today's Vitals   04/18/19 1402 04/18/19 1407  BP: 134/69   Pulse: 61   Resp: 18   Temp: 98.3 F (36.8 C)   TempSrc: Temporal   SpO2: 100%   Weight: 223 lb 9.6 oz (101.4 kg)   Height: 5\' 10"  (1.778 m)   PainSc:  0-No pain   Body mass index is 32.08 kg/m.  Filed Weights   04/18/19 1402  Weight: 223 lb 9.6 oz (101.4 kg)    GENERAL: alert, no distress and comfortable SKIN: skin color, texture, turgor are normal, no rashes or significant lesions EYES: conjunctiva are pink and non-injected, sclera clear OROPHARYNX: no exudate, no erythema; lips, buccal mucosa, and tongue normal  NECK: supple, non-tender LYMPH:  no palpable lymphadenopathy in the cervical LUNGS: clear to auscultation with normal breathing effort HEART: regular rate & rhythm and no murmurs and no lower extremity edema ABDOMEN: soft, non-tender, non-distended, normal bowel sounds Musculoskeletal: no cyanosis of digits and no clubbing  PSYCH: alert & oriented x 3, fluent speech  LABORATORY DATA:  I have reviewed the data as listed    Component Value Date/Time   NA 141 02/21/2019 1350   K 3.8 02/21/2019 1350   CL 105 02/21/2019 1350   CO2 23 02/21/2019 1350   GLUCOSE 89 02/21/2019 1350   BUN 11 02/21/2019 1350   CREATININE 0.79 02/21/2019 1350    CALCIUM 8.3 (L) 02/21/2019 1350   PROT 6.0 (L) 02/21/2019 1350   ALBUMIN 3.2 (L) 02/21/2019 1350   AST 17 02/21/2019 1350   ALT 10 02/21/2019 1350   ALKPHOS 41 02/21/2019 1350   BILITOT 0.7 02/21/2019 1350   GFRNONAA >60 02/21/2019 1350   GFRAA >60 02/21/2019 1350    No results found for: SPEP, UPEP  Lab Results  Component Value Date   WBC 4.4 04/18/2019   NEUTROABS 3.5 04/18/2019   HGB 14.8 04/18/2019   HCT 43.0 04/18/2019   MCV  91.7 04/18/2019   PLT 154 04/18/2019      Chemistry      Component Value Date/Time   NA 141 02/21/2019 1350   K 3.8 02/21/2019 1350   CL 105 02/21/2019 1350   CO2 23 02/21/2019 1350   BUN 11 02/21/2019 1350   CREATININE 0.79 02/21/2019 1350      Component Value Date/Time   CALCIUM 8.3 (L) 02/21/2019 1350   ALKPHOS 41 02/21/2019 1350   AST 17 02/21/2019 1350   ALT 10 02/21/2019 1350   BILITOT 0.7 02/21/2019 1350       RADIOGRAPHIC STUDIES: I have personally reviewed the radiological images as listed below and agreed with the findings in the report. NM PET Image Restag (PS) Skull Base To Thigh  Result Date: 04/16/2019 CLINICAL DATA:  Subsequent treatment strategy for tongue base malignancy. EXAM: NUCLEAR MEDICINE PET SKULL BASE TO THIGH TECHNIQUE: 11.2 mCi F-18 FDG was injected intravenously. Full-ring PET imaging was performed from the skull base to thigh after the radiotracer. CT data was obtained and used for attenuation correction and anatomic localization. Fasting blood glucose: 94 mg/dl COMPARISON:  Multiple exams, including PET-CT from 09/11/2018 FINDINGS: Mediastinal blood pool activity: SUV max 2.5 Liver activity: SUV max N/A NECK: Interval resolution of the previous hypermetabolic activity along right tongue base compatible with interval effective therapy. The previously hypermetabolic and enlarged right lateral cervical node has a short axis diameter of 0.8 cm (formerly 1.7 cm) and a maximum SUV of 4.7 (previously 5.8). No new enlarged  or hypermetabolic lymph nodes are identified. Activity along the glottis is likely physiologic. Incidental CT findings: Stranding in the subcutaneous tissues of the neck, possibly therapy related. CHEST: No significant abnormal hypermetabolic activity in this region. Incidental CT findings: Right Port-A-Cath tip: SVC. Coronary, aortic arch, and branch vessel atherosclerotic vascular disease. Left inferior thyroid nodule is low in metabolic activity accordingly considered benign. Cardiomegaly noted. Subsegmental atelectasis in both lower lobes. ABDOMEN/PELVIS: No significant abnormal hypermetabolic activity in this region. Incidental CT findings: Aortoiliac atherosclerotic vascular disease. Cholelithiasis. Sigmoid colon diverticulosis. Prostatomegaly. SKELETON: Accentuated activity along right hand is thought to be injection related (the injection was in the right hand). No significant abnormal skeletal activity. Incidental CT findings: Thoracic and lumbar spondylosis. IMPRESSION: 1. Interval resolution of the previously hypermetabolic activity along the right tongue base compatible with interval effective therapy. 2. Significant reduction in size and activity of the right lateral cervical node, currently 0.8 cm in diameter with maximum SUV 4.7. This activity is still abnormally high, but improved from prior. 3. No distant metastatic spread identified. 4. Other imaging findings of potential clinical significance: Cholelithiasis. Sigmoid colon diverticulosis. Prostatomegaly. Aortic Atherosclerosis (ICD10-I70.0). Coronary atherosclerosis and cardiomegaly. 5. Electronically Signed   By: Gaylyn Rong M.D.   On: 04/16/2019 13:11   IR PATIENT EVAL TECH 0-60 MINS  Result Date: 03/30/2019 Daryel November     03/30/2019  5:58 PM Pt came it today with pain an g tube insertion size.  The bumper was pulled back to evaluate the tube and site.  I could  move it in and out to make sure it moved freely in the stomach.  He  had some discomfort and the skin site.  There did appear a very small granuloma starting to form on the lower insertion site.  I applied silver nitrate.  Jeananne Rama PAC evaluated the site.  He confirmed that the site was not infected and no redness. The patient moved around in various positions that  had caused pain previously.  He still had a little soreness at the underside where the silver nitrate had been applied.  Otherwise, the sharp pain had disappeared. He wanted the G tube removed since he has been eating for 6 weeks and hand not used the g tube in over a week.  I advised him to call his oncologist to have that discussion.  He will call if he has any further problems  ECHOCARDIOGRAM COMPLETE  Result Date: 04/05/2019   ECHOCARDIOGRAM REPORT   Patient Name:   PEDROHENRIQUE METAYER   Date of Exam: 04/05/2019 Medical Rec #:  [id number]     Height:       70.0 in Accession #:    [id number]    Weight:       229.2 lb Date of Birth:  January 02, 1946     BSA:          2.21 m Patient Age:    73 years      BP:           139/72 mmHg Patient Gender: M             HR:           69 bpm. Exam Location:  [address] Procedure: 2D Echo, Cardiac Doppler and Color Doppler Indications:    I48.91 Atrial Fibrillation  History:        Patient has no prior history of Echocardiogram examinations.                 Squamous cell carcinoma of the tongue; Risk                 Factors:Hypertension.  Sonographer:    Clearence Ped RCS Referring Phys: [id number] Ronnald Ramp O'NEAL IMPRESSIONS  1. Left ventricular ejection fraction, by visual estimation, is 55 to 60%. The left ventricle has normal function. There is mildly increased left ventricular hypertrophy.  2. Left ventricular diastolic function could not be evaluated.  3. The left ventricle has no regional wall motion abnormalities.  4. Global right ventricle has normal systolic function.The right ventricular size is normal.  5. Left atrial size was moderately dilated.  6. Right atrial size was  mildly dilated.  7. The mitral valve is normal in structure. Mild mitral valve regurgitation. No evidence of mitral stenosis.  8. The tricuspid valve is normal in structure. Tricuspid valve regurgitation is mild.  9. The aortic valve is tricuspid. Aortic valve regurgitation is mild. Mild aortic valve sclerosis without stenosis. 10. The pulmonic valve was grossly normal. Pulmonic valve regurgitation is trivial. 11. Aortic dilatation noted. 12. There is mild dilatation of the ascending aorta measuring 40 mm. 13. The inferior vena cava is normal in size with greater than 50% respiratory variability, suggesting right atrial pressure of 3 mmHg. 14. Normal LV function; mild LVH; mildly dilated ascending aorta; mild AI and MR; biatrial enlargement. FINDINGS  Left Ventricle: Left ventricular ejection fraction, by visual estimation, is 55 to 60%. The left ventricle has normal function. The left ventricle has no regional wall motion abnormalities. There is mildly increased left ventricular hypertrophy. The left ventricular diastology could not be evaluated due to atrial fibrillation. Left ventricular diastolic function could not be evaluated. Right Ventricle: The right ventricular size is normal. Global RV systolic function is has normal systolic function. The tricuspid regurgitant velocity is 2.42 m/s, and with an assumed right atrial pressure of 3 mmHg, the estimated right ventricular systolic pressure is normal at 26.3 mmHg. Left Atrium:  Left atrial size was moderately dilated. Right Atrium: Right atrial size was mildly dilated Pericardium: There is no evidence of pericardial effusion. Mitral Valve: The mitral valve is normal in structure. Mild mitral valve regurgitation. No evidence of mitral valve stenosis by observation. Tricuspid Valve: The tricuspid valve is normal in structure. Tricuspid valve regurgitation is mild. Aortic Valve: The aortic valve is tricuspid. Aortic valve regurgitation is mild. Aortic regurgitation  PHT measures 798 msec. Mild aortic valve sclerosis is present, with no evidence of aortic valve stenosis. Pulmonic Valve: The pulmonic valve was grossly normal. Pulmonic valve regurgitation is trivial. Pulmonic regurgitation is trivial. Aorta: Aortic dilatation noted. There is mild dilatation of the ascending aorta measuring 40 mm. Venous: The inferior vena cava is normal in size with greater than 50% respiratory variability, suggesting right atrial pressure of 3 mmHg. IAS/Shunts: No atrial level shunt detected by color flow Doppler. Additional Comments: Normal LV function; mild LVH; mildly dilated ascending aorta; mild AI and MR; biatrial enlargement.  LEFT VENTRICLE PLAX 2D LVIDd:         5.01 cm  Diastology LVIDs:         3.19 cm  LV e' lateral:   14.80 cm/s LV PW:         1.45 cm  LV E/e' lateral: 6.5 LV IVS:        1.34 cm  LV e' medial:    10.70 cm/s LVOT diam:     2.00 cm  LV E/e' medial:  9.0 LV SV:         78 ml LV SV Index:   33.99 LVOT Area:     3.14 cm                          3D Volume EF:                         3D EF:        69 %                         LV EDV:       152 ml                         LV ESV:       47 ml                         LV SV:        106 ml RIGHT VENTRICLE RV Basal diam:  3.99 cm RV S prime:     14.00 cm/s TAPSE (M-mode): 1.8 cm RVSP:           26.3 mmHg LEFT ATRIUM             Index       RIGHT ATRIUM           Index LA diam:        4.50 cm 2.03 cm/m  RA Pressure: 3.00 mmHg LA Vol (A2C):   93.9 ml 42.45 ml/m RA Area:     21.70 cm LA Vol (A4C):   98.0 ml 44.31 ml/m RA Volume:   55.30 ml  25.00 ml/m LA Biplane Vol: 97.9 ml 44.26 ml/m  AORTIC VALVE LVOT Vmax:   98.90 cm/s LVOT Vmean:  64.050 cm/s LVOT VTI:    0.231 m AI  PHT:      798 msec  AORTA Ao Root diam: 3.30 cm MITRAL VALVE                       TRICUSPID VALVE MV Area (PHT):                     TR Peak grad:   23.3 mmHg MV PHT:                            TR Vmax:        263.00 cm/s MV Decel Time: 194 msec             Estimated RAP:  3.00 mmHg MV E velocity: 96.05 cm/s 103 cm/s RVSP:           26.3 mmHg                                     SHUNTS                                    Systemic VTI:  0.23 m                                    Systemic Diam: 2.00 cm  Olga Millers MD Electronically signed by Olga Millers MD Signature Date/Time: 04/05/2019/3:29:43 PM    Final

## 2019-04-19 ENCOUNTER — Telehealth: Payer: Self-pay | Admitting: *Deleted

## 2019-04-19 ENCOUNTER — Ambulatory Visit: Payer: Medicare Other | Attending: Radiation Oncology | Admitting: Physical Therapy

## 2019-04-19 ENCOUNTER — Encounter: Payer: Self-pay | Admitting: Physical Therapy

## 2019-04-19 ENCOUNTER — Telehealth: Payer: Self-pay | Admitting: Hematology

## 2019-04-19 ENCOUNTER — Other Ambulatory Visit: Payer: Self-pay

## 2019-04-19 DIAGNOSIS — I890 Lymphedema, not elsewhere classified: Secondary | ICD-10-CM | POA: Insufficient documentation

## 2019-04-19 DIAGNOSIS — R262 Difficulty in walking, not elsewhere classified: Secondary | ICD-10-CM | POA: Diagnosis not present

## 2019-04-19 DIAGNOSIS — R1310 Dysphagia, unspecified: Secondary | ICD-10-CM | POA: Insufficient documentation

## 2019-04-19 DIAGNOSIS — R293 Abnormal posture: Secondary | ICD-10-CM

## 2019-04-19 DIAGNOSIS — M542 Cervicalgia: Secondary | ICD-10-CM | POA: Diagnosis not present

## 2019-04-19 DIAGNOSIS — M6281 Muscle weakness (generalized): Secondary | ICD-10-CM | POA: Diagnosis not present

## 2019-04-19 NOTE — Telephone Encounter (Signed)
I talk with patient regarding schedule he will look at my chart

## 2019-04-19 NOTE — Progress Notes (Signed)
Oncology Nurse Navigator Documentation  Met with Mr. Fulop during post-treatment follow-up with Dr. Isidore Moos which included review of yesterday's re-staging PET. He voiced understanding:  Overall excellent response to chemoRT.    Slight activity in R cervical LN to be followed with repeat PET in early June.  He will RTC to see Dr. Isidore Moos to review June PET results.  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  He will be referred back to ENT Dr. Blenda Nicely for post-treatment follow-up in mid-March. I encouraged him to call with needs/concerns.  Navigator Interventions Sent fax 2/11 to [facility] ENT Scheduling with request Mr. Jaco be contacted and scheduled for routine post-RT follow-up in mid-March with Dr. Blenda Nicely, visit to include surveillance laryngoscopy.  Notification of successful fax transmission received.  Gayleen Orem, RN, BSN Head & Neck Oncology Nurse [place] [facility] [place] [phone]

## 2019-04-19 NOTE — Therapy (Signed)
[place], [place], [address] Phone: [phone]   Fax:  [phone]  Physical Therapy Evaluation  Patient Details  Name: Neil [name] MRN: [id number] Date of Birth: [birth year] Referring Provider (PT): Reita May Date: 04/19/2019  PT End of Session - 04/19/19 6269    Visit Number  1    Number of Visits  9    Date for PT Re-Evaluation  05/17/19    PT Start Time  4854    PT Stop Time  1555    PT Time Calculation (min)  59 min       Past Medical History:  Diagnosis Date  . Atrial fibrillation ([place])   . Chronic anticoagulation    On Eliquis  . GERD (gastroesophageal reflux disease)   . History of radiation therapy 11/16/18- 01/03/19   Head and Neck, Base of Tongue 35 fractions of 2 Gy each for a total of 70 Gy.   Marland Kitchen Hypertension   . Joint pain     Past Surgical History:  Procedure Laterality Date  . HEMORROIDECTOMY  2004  . HERNIA REPAIR  1960  . IR GASTROSTOMY TUBE MOD SED  11/07/2018  . IR IMAGING GUIDED PORT INSERTION  11/07/2018  . IR PATIENT EVAL TECH 0-60 MINS  03/30/2019  . IR [place] TUBE PERCUT W/FLUORO  01/01/2019  . IR US GUIDANCE  09/04/2018  . IR US GUIDE BX ASP/DRAIN  09/04/2018  . MOUTH BIOPSY  09/04/2018   right jaw  . right testical removed    . TONSILLECTOMY      There were no vitals filed for this visit.   Subjective Assessment - 04/19/19 1502    Subjective  I started developing swelling in my neck around Christmas 2020 and it has worsened over the last few weeks. I got an infection in the right side of my face and they gave me antibiotics then the swelling in my neck showed. I have been feeling more weak since I completed the radiation. I would like to get back to walking. I use the cane when I am in the community.    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  No/denies    Pain Score  0-No pain         [facility] PT Assessment - 04/19/19 0001      Assessment   Medical Diagnosis  base of tongue cancer    Referring Provider (PT)  Isidore Moos    Onset Date/Surgical Date  08/07/18    Hand Dominance  Right    Prior Therapy  none      Precautions   Precautions  Other (comment)    Precaution Comments  lymphedema      Restrictions   Weight Bearing Restrictions  No      Balance Screen   Has the patient fallen in the past 6 months  No    Has the patient had a decrease in activity level because of a fear of falling?   No    Is the patient reluctant to leave their home because of a fear of falling?   No      Home Film/video editor residence    Living Arrangements  [place], [place], [address] Phone: [phone]   Fax:  [phone]  Physical Therapy Evaluation  Patient Details  Name: Neil [name] MRN: [id number] Date of Birth: [birth year] Referring Provider (PT): Reita May Date: 04/19/2019  PT End of Session - 04/19/19 6269    Visit Number  1    Number of Visits  9    Date for PT Re-Evaluation  05/17/19    PT Start Time  4854    PT Stop Time  1555    PT Time Calculation (min)  59 min       Past Medical History:  Diagnosis Date  . Atrial fibrillation ([place])   . Chronic anticoagulation    On Eliquis  . GERD (gastroesophageal reflux disease)   . History of radiation therapy 11/16/18- 01/03/19   Head and Neck, Base of Tongue 35 fractions of 2 Gy each for a total of 70 Gy.   Marland Kitchen Hypertension   . Joint pain     Past Surgical History:  Procedure Laterality Date  . HEMORROIDECTOMY  2004  . HERNIA REPAIR  1960  . IR GASTROSTOMY TUBE MOD SED  11/07/2018  . IR IMAGING GUIDED PORT INSERTION  11/07/2018  . IR PATIENT EVAL TECH 0-60 MINS  03/30/2019  . IR [place] TUBE PERCUT W/FLUORO  01/01/2019  . IR US GUIDANCE  09/04/2018  . IR US GUIDE BX ASP/DRAIN  09/04/2018  . MOUTH BIOPSY  09/04/2018   right jaw  . right testical removed    . TONSILLECTOMY      There were no vitals filed for this visit.   Subjective Assessment - 04/19/19 1502    Subjective  I started developing swelling in my neck around Christmas 2020 and it has worsened over the last few weeks. I got an infection in the right side of my face and they gave me antibiotics then the swelling in my neck showed. I have been feeling more weak since I completed the radiation. I would like to get back to walking. I use the cane when I am in the community.    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  No/denies    Pain Score  0-No pain         [facility] PT Assessment - 04/19/19 0001      Assessment   Medical Diagnosis  base of tongue cancer    Referring Provider (PT)  Isidore Moos    Onset Date/Surgical Date  08/07/18    Hand Dominance  Right    Prior Therapy  none      Precautions   Precautions  Other (comment)    Precaution Comments  lymphedema      Restrictions   Weight Bearing Restrictions  No      Balance Screen   Has the patient fallen in the past 6 months  No    Has the patient had a decrease in activity level because of a fear of falling?   No    Is the patient reluctant to leave their home because of a fear of falling?   No      Home Film/video editor residence    Living Arrangements  and add goals for these    PT Home Exercise Plan  head and neck ROM exercises, practice sit to stands    Consulted and Agree with Plan of Care  Patient       Patient will benefit from skilled therapeutic intervention in order to improve the following deficits and impairments:  Decreased balance, Decreased endurance, Difficulty walking, Decreased knowledge of precautions, Decreased range of motion, Increased edema, Decreased activity tolerance, Decreased strength, Increased fascial restricitons, Impaired UE functional use, Postural dysfunction, Pain  Visit Diagnosis: Lymphedema, not elsewhere classified  Cervicalgia  Difficulty in walking, not elsewhere classified  Muscle weakness (generalized)  Abnormal posture     Problem List Patient Active Problem List   Diagnosis Date Noted  . Port-A-Cath in place 11/22/2018  . CKD (chronic kidney disease), stage III 10/18/2018  . Cancer of base of tongue ([place]) 10/16/2018    Allyson Sabal [facility] 04/19/2019, 5:29 PM  [place], [place], [address] Phone: [phone]   Fax:  [phone]  Name: Giancarlos Berendt MRN: [id number] Date of Birth: [birth year]  Manus Gunning, PT 04/19/19 5:29 PM  [place], [place], [address] Phone: [phone]   Fax:  [phone]  Physical Therapy Evaluation  Patient Details  Name: Neil [name] MRN: [id number] Date of Birth: [birth year] Referring Provider (PT): Reita May Date: 04/19/2019  PT End of Session - 04/19/19 6269    Visit Number  1    Number of Visits  9    Date for PT Re-Evaluation  05/17/19    PT Start Time  4854    PT Stop Time  1555    PT Time Calculation (min)  59 min       Past Medical History:  Diagnosis Date  . Atrial fibrillation ([place])   . Chronic anticoagulation    On Eliquis  . GERD (gastroesophageal reflux disease)   . History of radiation therapy 11/16/18- 01/03/19   Head and Neck, Base of Tongue 35 fractions of 2 Gy each for a total of 70 Gy.   Marland Kitchen Hypertension   . Joint pain     Past Surgical History:  Procedure Laterality Date  . HEMORROIDECTOMY  2004  . HERNIA REPAIR  1960  . IR GASTROSTOMY TUBE MOD SED  11/07/2018  . IR IMAGING GUIDED PORT INSERTION  11/07/2018  . IR PATIENT EVAL TECH 0-60 MINS  03/30/2019  . IR [place] TUBE PERCUT W/FLUORO  01/01/2019  . IR US GUIDANCE  09/04/2018  . IR US GUIDE BX ASP/DRAIN  09/04/2018  . MOUTH BIOPSY  09/04/2018   right jaw  . right testical removed    . TONSILLECTOMY      There were no vitals filed for this visit.   Subjective Assessment - 04/19/19 1502    Subjective  I started developing swelling in my neck around Christmas 2020 and it has worsened over the last few weeks. I got an infection in the right side of my face and they gave me antibiotics then the swelling in my neck showed. I have been feeling more weak since I completed the radiation. I would like to get back to walking. I use the cane when I am in the community.    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  No/denies    Pain Score  0-No pain         [facility] PT Assessment - 04/19/19 0001      Assessment   Medical Diagnosis  base of tongue cancer    Referring Provider (PT)  Isidore Moos    Onset Date/Surgical Date  08/07/18    Hand Dominance  Right    Prior Therapy  none      Precautions   Precautions  Other (comment)    Precaution Comments  lymphedema      Restrictions   Weight Bearing Restrictions  No      Balance Screen   Has the patient fallen in the past 6 months  No    Has the patient had a decrease in activity level because of a fear of falling?   No    Is the patient reluctant to leave their home because of a fear of falling?   No      Home Film/video editor residence    Living Arrangements

## 2019-04-20 ENCOUNTER — Ambulatory Visit: Payer: Medicare Other

## 2019-04-20 DIAGNOSIS — R1310 Dysphagia, unspecified: Secondary | ICD-10-CM

## 2019-04-20 DIAGNOSIS — M542 Cervicalgia: Secondary | ICD-10-CM | POA: Diagnosis not present

## 2019-04-20 DIAGNOSIS — M6281 Muscle weakness (generalized): Secondary | ICD-10-CM | POA: Diagnosis not present

## 2019-04-20 DIAGNOSIS — R293 Abnormal posture: Secondary | ICD-10-CM | POA: Diagnosis not present

## 2019-04-20 DIAGNOSIS — I890 Lymphedema, not elsewhere classified: Secondary | ICD-10-CM | POA: Diagnosis not present

## 2019-04-20 DIAGNOSIS — R262 Difficulty in walking, not elsewhere classified: Secondary | ICD-10-CM | POA: Diagnosis not present

## 2019-04-20 NOTE — Telephone Encounter (Signed)
Oncology Nurse Navigator Documentation  Per patient's 2/9 post-treatment follow-up with Dr. Isidore Moos, sent fax to [facility] ENT Scheduling with request he be contacted/scheduled for mid-March follow-up with ENT Dr. Blenda Nicely, visit to include laryngoscopy.  Notification of successful fax transmission received.  Addendum:  Received e-mail notification from [place], [place], appt scheduled 3/22 1:10 pm.  Gayleen Orem, RN, BSN Head & Neck Oncology Nurse [place] [facility] [place] [phone]

## 2019-04-20 NOTE — Progress Notes (Signed)
A user error has taken place: encounter opened in error, closed for administrative reasons.

## 2019-04-20 NOTE — Therapy (Signed)
tolerated        SLP Short Term Goals - 11/27/18 1422      SLP SHORT TERM GOAL #1   Title  Pt will demo understanding of proper procedure for HEP with rare min A    Period  --   session   Status  Partially Met      SLP SHORT TERM GOAL #2   Title  pt will tell SLP why he is completing HEP    Period  --   session   Status  Partially Met       SLP Long Term Goals - 04/20/19 1509      SLP LONG TERM GOAL #1   Title  pt will demo understanding of proper HEP procedure with modified indpendence x2 sessions    Baseline  12-25-18, 04-20-19    Time  1    Period  --   sessions   Status  On-going      SLP LONG TERM GOAL #2   Title  pt will tell SLP 3 overt s/sx aspiration PNA with modified independence    Status  Achieved      SLP LONG TERM GOAL #3   Title  pt will tell the benefit of a food journal for return to pre-chemorad diet    Status  Achieved      SLP LONG TERM  GOAL #4   Title  pt will tell when to decr frequency of HEP from daily to x2-3/week x2 sessions    Baseline  04-20-19    Time  1    Period  --   visits   Status  On-going       Plan - 04/20/19 1515    Clinical Impression Statement  Pt presents today with WFL/WNL swallowing for liquids, WFL for solids (dys III). See "other comments" for details. No overt s/s aspiration pNA today, none reported. Pt completed HEP today with SBA. Data show that probability of dysphagia increases during chemorad tx and afterwards, and is mitigated by HEP focusing on swallowing musculature. Pt would benefit from follow up skilled ST to assess pt's safety with POs and also to assess pt's ability to correctly perform HEP. Visits will be performed virtually (telephone or WEbex) or in - person.    Speech Therapy Frequency  --   approx once every 8 weeks   Duration  --   90 days (4 total sessions)   Treatment/Interventions  Aspiration precaution training;Pharyngeal strengthening exercises;Diet toleration management by SLP;Internal/external aids;Patient/family education;SLP instruction and feedback;Trials of upgraded texture/liquids;Environmental controls    Potential to [place]  provided today       Patient will benefit from skilled therapeutic intervention in order to improve the following deficits and impairments:   Dysphagia, unspecified type    Problem List Patient Active Problem List   Diagnosis Date Noted  . Port-A-Cath in place 11/22/2018  . CKD (chronic kidney disease), stage III 10/18/2018  . Cancer of base of tongue ([place]) 10/16/2018    [facility] ,[place], [place]  04/20/2019, 3:17 PM  [place] [address] [place], [place], [address] Phone: [phone]   Fax:  [phone]   Name: Neil [name] MRN: [id number] Date of Birth: 19-Apr-1945  [place] [address] [place], [place], [address] Phone: [phone]   Fax:  [phone]  Speech Language Pathology Treatment  Patient Details  Name: Neil [name] MRN: [id number] Date of Birth: 11-Jul-1945 Referring Provider (SLP): Eppie Gibson, MD   Encounter Date: 04/20/2019  End of Session - 04/20/19 1515    Visit Number  6    Number of Visits  7    Date for SLP Re-Evaluation  04/25/19    SLP Start Time  1440    SLP Stop Time   1513    SLP Time Calculation (min)  33 min    Activity Tolerance  Patient tolerated treatment well       Past Medical History:  Diagnosis Date  . Atrial fibrillation ([place])   . Chronic anticoagulation    On Eliquis  . GERD (gastroesophageal reflux disease)   . History of radiation therapy 11/16/18- 01/03/19   Head and Neck, Base of Tongue 35 fractions of 2 Gy each for a total of 70 Gy.   Marland Kitchen Hypertension   . Joint pain     Past Surgical History:  Procedure Laterality Date  . HEMORROIDECTOMY  2004  . HERNIA REPAIR  1960  . IR GASTROSTOMY TUBE MOD SED  11/07/2018  . IR IMAGING GUIDED PORT INSERTION  11/07/2018  . IR PATIENT EVAL TECH 0-60 MINS  03/30/2019  . IR [place] TUBE PERCUT W/FLUORO  01/01/2019  . IR US GUIDANCE  09/04/2018  . IR US GUIDE BX ASP/DRAIN  09/04/2018  . MOUTH BIOPSY  09/04/2018   right jaw  . right testical removed    . TONSILLECTOMY      There were no vitals filed for this visit.  Subjective Assessment - 04/20/19 1443    Subjective  "I saw [name] at cancer rehab yesterday."    Currently in Pain?  No/denies            ADULT SLP TREATMENT - 04/20/19 1444      General Information   Behavior/Cognition  Alert;Cooperative;Pleasant mood      Dysphagia Treatment   Temperature Spikes Noted  No    Respiratory Status  Room air    Oral Cavity - Dentition  Edentulous    Treatment Methods  Therapeutic exercise;Patient/caregiver education;Skilled  observation    Patient observed directly with PO's  Yes    Type of PO's observed  Thin liquids    Oral Phase Signs & Symptoms  Other (comment)   no overt s/s with H2O   Pharyngeal Phase Signs & Symptoms  Complaints of residue   with larger bites of cheese   Other treatment/comments  Pt does not have the same coughing/gagging during the day thta he had last session. He relates he did HEP once yesterday. "exercises working out well becuase I'm able to eat and swallow. I try to keep small bites and it feels more comfortable." Pt ate american cheese and c/o pharyngeal residue with larger bites - pt pointed to above his thyroid where he felt residue (SLP suspects vallecula), and drank water without any overt s/s aspiration. Pt req'd SBA with exercises. SLP taught pt (jaw open against resistance) JOAR.      Assessment / Recommendations / Plan   Plan  Continue with current plan of care   see pt in 8 weeks due to progress     Dysphagia Recommendations   Liquids provided via  C[employer]    Medication Administration  --   as

## 2019-04-23 ENCOUNTER — Ambulatory Visit: Payer: Medicare Other

## 2019-04-25 ENCOUNTER — Ambulatory Visit: Payer: Medicare Other | Admitting: Physical Therapy

## 2019-04-25 ENCOUNTER — Encounter: Payer: Self-pay | Admitting: Physical Therapy

## 2019-04-25 ENCOUNTER — Other Ambulatory Visit: Payer: Self-pay

## 2019-04-25 ENCOUNTER — Other Ambulatory Visit: Payer: Self-pay | Admitting: Hematology

## 2019-04-25 ENCOUNTER — Other Ambulatory Visit: Payer: Self-pay | Admitting: Radiology

## 2019-04-25 DIAGNOSIS — R293 Abnormal posture: Secondary | ICD-10-CM | POA: Diagnosis not present

## 2019-04-25 DIAGNOSIS — M542 Cervicalgia: Secondary | ICD-10-CM | POA: Diagnosis not present

## 2019-04-25 DIAGNOSIS — M6281 Muscle weakness (generalized): Secondary | ICD-10-CM | POA: Diagnosis not present

## 2019-04-25 DIAGNOSIS — R1310 Dysphagia, unspecified: Secondary | ICD-10-CM | POA: Diagnosis not present

## 2019-04-25 DIAGNOSIS — I890 Lymphedema, not elsewhere classified: Secondary | ICD-10-CM | POA: Diagnosis not present

## 2019-04-25 DIAGNOSIS — R262 Difficulty in walking, not elsewhere classified: Secondary | ICD-10-CM | POA: Diagnosis not present

## 2019-04-25 NOTE — Patient Instructions (Signed)
Manual lymph drainage for the neck, anterior approach  Sit in front of a mirror. Do 5 slow deep breaths, breathing in through the nose and out through the mouth, letting your belly "inflate" as you breathe in.  Rest your hands on your abdomen as you do this to give slight pressure there.  1) Place hands on areas just behind collar bones and do 10 stationary circles with stretch in outward directions. 2) Do stationary circles at each armpit about 10 times. 3) Place one hand on the front of the opposite shoulder and do stationary circles with stretch downward toward underarm. 4) Repeat #1 above. 5) Imagine a river running in a line from the earlobe straight down the neck.  Place hands just behind this and do circles with stretch coming forward slightly and down, thinking about fluid flowing down that river.  Do 10 times. 6) Place one hand just in front of the river on one side and do circles with a slight back and then downward stretch, thinking again about putting that fluid in the river.  Do 10-20 times on each side. 7) Place one hand just slightly in front of the spot you just did and do the same thing.  DO THIS VERY GENTLY. 8) Use the webspace between your thumb and index finger to pump downward starting just under the chin and "stair stepping" downward with a stretch, working down to the chest. 9) Repeat #1. 10) Do stationary circles on each side of the face just above the chin, out and down with the stretch 10 times. 11) Do stationary circles on each side of the face on the cheeks with pressure going back and down, 10 times. 12) Do stationary circles on each side of the face between the eyes and ears, again back and downward 10 times. 13) Repeat steps 9,8,7,6,5,1,3 and 2 in that order!  Do not slide on the skin, but STRETCH it with your motions. Only give enough pressure to stretch the skin. DO THIS SLOWLY, PLEASE!  And do once a day.  Cancer Rehab [phone]  

## 2019-04-25 NOTE — Therapy (Signed)
[place], [place], [address] Phone: [phone]   Fax:  [phone]  Physical Therapy Treatment  Patient Details  Name: Neil [name] MRN: [id number] Date of Birth: 11-22-1945 Referring Provider (PT): Reita May Date: 04/25/2019  PT End of Session - 04/25/19 1617    Visit Number  2    Number of Visits  9    PT Start Time  1400    PT Stop Time  1445    PT Time Calculation (min)  45 min    Activity Tolerance  Patient tolerated treatment well    Behavior During Therapy  [facility] for tasks assessed/performed       Past Medical History:  Diagnosis Date  . Atrial fibrillation ([place])   . Chronic anticoagulation    On Eliquis  . GERD (gastroesophageal reflux disease)   . History of radiation therapy 11/16/18- 01/03/19   Head and Neck, Base of Tongue 35 fractions of 2 Gy each for a total of 70 Gy.   Marland Kitchen Hypertension   . Joint pain     Past Surgical History:  Procedure Laterality Date  . HEMORROIDECTOMY  2004  . HERNIA REPAIR  1960  . IR GASTROSTOMY TUBE MOD SED  11/07/2018  . IR IMAGING GUIDED PORT INSERTION  11/07/2018  . IR PATIENT EVAL TECH 0-60 MINS  03/30/2019  . IR [place] TUBE PERCUT W/FLUORO  01/01/2019  . IR US GUIDANCE  09/04/2018  . IR US GUIDE BX ASP/DRAIN  09/04/2018  . MOUTH BIOPSY  09/04/2018   right jaw  . right testical removed    . TONSILLECTOMY      There were no vitals filed for this visit.  Subjective Assessment - 04/25/19 1403    Subjective  Pt says he has been wearing his chip pack for about 1/2 at time then he takes it off and puts it back on and off throughout the day Pt would like to get a compression garment.    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  No/denies                       [facility] Adult PT Treatment/Exercise - 04/25/19 0001      Exercises   Exercises  Neck      Neck Exercises: Seated   Shoulder Shrugs  5 reps    Other Seated Exercise  neck ROM in flexion, extension, rotation and side bending       Manual Therapy   Manual Therapy  Manual Lymphatic Drainage (MLD)    Manual therapy comments  instructed pt in technique with hand over hand at neck     Edema Management  discussed garments and pt would like to have one ordered.  Demographics sent to [facility] to see if Medicaid will cover     Manual Lymphatic Drainage (MLD)  instructed in a performed MLD in sitting in front of mirror and with handout.  5 deep breaths, short neck avoiding port on right side both axillary circles. anterior neck to chest across clavicular watershed, antero-lateral neck to lateral neck, posterior-lateral neck to lateral neck , cheeks , around ears and more work on anterior neck.  [place], [place], [address] Phone: [phone]   Fax:  [phone]  Physical Therapy Treatment  Patient Details  Name: Neil [name] MRN: [id number] Date of Birth: 11-22-1945 Referring Provider (PT): Reita May Date: 04/25/2019  PT End of Session - 04/25/19 1617    Visit Number  2    Number of Visits  9    PT Start Time  1400    PT Stop Time  1445    PT Time Calculation (min)  45 min    Activity Tolerance  Patient tolerated treatment well    Behavior During Therapy  [facility] for tasks assessed/performed       Past Medical History:  Diagnosis Date  . Atrial fibrillation ([place])   . Chronic anticoagulation    On Eliquis  . GERD (gastroesophageal reflux disease)   . History of radiation therapy 11/16/18- 01/03/19   Head and Neck, Base of Tongue 35 fractions of 2 Gy each for a total of 70 Gy.   Marland Kitchen Hypertension   . Joint pain     Past Surgical History:  Procedure Laterality Date  . HEMORROIDECTOMY  2004  . HERNIA REPAIR  1960  . IR GASTROSTOMY TUBE MOD SED  11/07/2018  . IR IMAGING GUIDED PORT INSERTION  11/07/2018  . IR PATIENT EVAL TECH 0-60 MINS  03/30/2019  . IR [place] TUBE PERCUT W/FLUORO  01/01/2019  . IR US GUIDANCE  09/04/2018  . IR US GUIDE BX ASP/DRAIN  09/04/2018  . MOUTH BIOPSY  09/04/2018   right jaw  . right testical removed    . TONSILLECTOMY      There were no vitals filed for this visit.  Subjective Assessment - 04/25/19 1403    Subjective  Pt says he has been wearing his chip pack for about 1/2 at time then he takes it off and puts it back on and off throughout the day Pt would like to get a compression garment.    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  No/denies                       [facility] Adult PT Treatment/Exercise - 04/25/19 0001      Exercises   Exercises  Neck      Neck Exercises: Seated   Shoulder Shrugs  5 reps    Other Seated Exercise  neck ROM in flexion, extension, rotation and side bending       Manual Therapy   Manual Therapy  Manual Lymphatic Drainage (MLD)    Manual therapy comments  instructed pt in technique with hand over hand at neck     Edema Management  discussed garments and pt would like to have one ordered.  Demographics sent to [facility] to see if Medicaid will cover     Manual Lymphatic Drainage (MLD)  instructed in a performed MLD in sitting in front of mirror and with handout.  5 deep breaths, short neck avoiding port on right side both axillary circles. anterior neck to chest across clavicular watershed, antero-lateral neck to lateral neck, posterior-lateral neck to lateral neck , cheeks , around ears and more work on anterior neck.  dysfunction, Pain  Visit Diagnosis: Lymphedema, not elsewhere classified  Cervicalgia  Difficulty in walking, not elsewhere classified  Muscle weakness (generalized)  Abnormal posture     Problem List Patient Active Problem List   Diagnosis Date Noted  . Port-A-Cath in place 11/22/2018  . CKD (chronic kidney disease), stage III 10/18/2018  . Cancer of base of tongue ([place]) 10/16/2018    [name]. Owens Shark PT  [name] 04/25/2019, 4:27 PM  [place], [place], [address] Phone: [phone]   Fax:  [phone]  Name: Neil [name] MRN: [id number] Date of Birth: 11-Apr-1945

## 2019-04-27 ENCOUNTER — Ambulatory Visit (HOSPITAL_COMMUNITY): Payer: Medicare Other

## 2019-04-27 ENCOUNTER — Other Ambulatory Visit (HOSPITAL_COMMUNITY): Payer: Medicare Other

## 2019-05-01 ENCOUNTER — Other Ambulatory Visit: Payer: Self-pay | Admitting: Radiology

## 2019-05-02 ENCOUNTER — Ambulatory Visit (HOSPITAL_COMMUNITY)
Admission: RE | Admit: 2019-05-02 | Discharge: 2019-05-02 | Disposition: A | Discharge status: Home / Self Care | Payer: Medicare Other | Source: Ambulatory Visit | Attending: Hematology | Admitting: Hematology

## 2019-05-02 ENCOUNTER — Encounter: Payer: Medicare Other | Admitting: Physical Therapy

## 2019-05-02 ENCOUNTER — Ambulatory Visit (HOSPITAL_COMMUNITY)
Admission: RE | Admit: 2019-05-02 | Discharge: 2019-05-02 | Disposition: A | Discharge status: Home / Self Care | Payer: Medicare Other | Source: Ambulatory Visit | Attending: Interventional Radiology | Admitting: Interventional Radiology

## 2019-05-02 ENCOUNTER — Encounter (HOSPITAL_COMMUNITY): Payer: Self-pay

## 2019-05-02 ENCOUNTER — Other Ambulatory Visit: Payer: Self-pay

## 2019-05-02 DIAGNOSIS — Z431 Encounter for attention to gastrostomy: Secondary | ICD-10-CM | POA: Diagnosis not present

## 2019-05-02 DIAGNOSIS — C01 Malignant neoplasm of base of tongue: Secondary | ICD-10-CM | POA: Insufficient documentation

## 2019-05-02 DIAGNOSIS — Z452 Encounter for adjustment and management of vascular access device: Secondary | ICD-10-CM | POA: Diagnosis not present

## 2019-05-02 DIAGNOSIS — C029 Malignant neoplasm of tongue, unspecified: Secondary | ICD-10-CM | POA: Diagnosis not present

## 2019-05-02 HISTORY — PX: IR REMOVAL TUN ACCESS W/ PORT W/O FL MOD SED: IMG2290

## 2019-05-02 HISTORY — PX: IR GASTROSTOMY TUBE REMOVAL: IMG5492

## 2019-05-02 LAB — CBC WITH DIFFERENTIAL/PLATELET
Abs Immature Granulocytes: 0.02 10*3/uL (ref 0.00–0.07)
Basophils Absolute: 0 10*3/uL (ref 0.0–0.1)
Basophils Relative: 1 %
Eosinophils Absolute: 0.1 10*3/uL (ref 0.0–0.5)
Eosinophils Relative: 2 %
HCT: 48.9 % (ref 39.0–52.0)
Hemoglobin: 16.4 g/dL (ref 13.0–17.0)
Immature Granulocytes: 1 %
Lymphocytes Relative: 13 %
Lymphs Abs: 0.5 10*3/uL — ABNORMAL LOW (ref 0.7–4.0)
MCH: 31.4 pg (ref 26.0–34.0)
MCHC: 33.5 g/dL (ref 30.0–36.0)
MCV: 93.5 fL (ref 80.0–100.0)
Monocytes Absolute: 0.4 10*3/uL (ref 0.1–1.0)
Monocytes Relative: 11 %
Neutro Abs: 2.6 10*3/uL (ref 1.7–7.7)
Neutrophils Relative %: 72 %
Platelets: 150 10*3/uL (ref 150–400)
RBC: 5.23 MIL/uL (ref 4.22–5.81)
RDW: 12.6 % (ref 11.5–15.5)
WBC: 3.5 10*3/uL — ABNORMAL LOW (ref 4.0–10.5)
nRBC: 0 % (ref 0.0–0.2)

## 2019-05-02 LAB — BASIC METABOLIC PANEL
Anion gap: 12 (ref 5–15)
BUN: 11 mg/dL (ref 8–23)
CO2: 24 mmol/L (ref 22–32)
Calcium: 9 mg/dL (ref 8.9–10.3)
Chloride: 106 mmol/L (ref 98–111)
Creatinine, Ser: 1.05 mg/dL (ref 0.61–1.24)
GFR calc Af Amer: >60 mL/min (ref >60)
GFR calc non Af Amer: >60 mL/min (ref >60)
Glucose, Bld: 101 mg/dL — ABNORMAL HIGH (ref 70–99)
Potassium: 3.2 mmol/L — ABNORMAL LOW (ref 3.5–5.1)
Sodium: 142 mmol/L (ref 135–145)

## 2019-05-02 LAB — PROTIME-INR
INR: 1 (ref 0.8–1.2)
Prothrombin Time: 13.2 seconds (ref 11.4–15.2)

## 2019-05-02 MED ORDER — SODIUM CHLORIDE 0.9 % IV SOLN: INTRAVENOUS | Status: DC

## 2019-05-02 MED ORDER — LIDOCAINE VISCOUS HCL 2 % MT SOLN
OROMUCOSAL | Status: AC
  Filled 2019-05-02: qty 15

## 2019-05-02 MED ORDER — LIDOCAINE HCL 1 % IJ SOLN
INTRAMUSCULAR | Status: AC
  Filled 2019-05-02: qty 20

## 2019-05-02 MED ORDER — CEFAZOLIN SODIUM-DEXTROSE 2-4 GM/100ML-% IV SOLN
INTRAVENOUS | Status: AC
  Filled 2019-05-02: qty 100

## 2019-05-02 MED ORDER — CEFAZOLIN SODIUM-DEXTROSE 2-4 GM/100ML-% IV SOLN
2.0000 g | INTRAVENOUS | Status: AC
  Administered 2019-05-02: 2 g via INTRAVENOUS

## 2019-05-02 NOTE — Progress Notes (Signed)
Patient ID: Neil [name], male   DOB: 1945/09/15, 74 y.o.   MRN: [id number] Patient presents today for gastrostomy tube and Port-A-Cath removals.  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  He is eating a regular diet.  He has no evidence of definite recurrent or residual disease.  Details/risks of above procedures, including but not limited to, internal bleeding, infection, injury to adjacent structures discussed with patient with his understanding and consent.  He does not wish to receive IV conscious sedation for the procedures.

## 2019-05-02 NOTE — Procedures (Signed)
Tongue ca, completed treatment  S/p RT IJ POWER PORT REMOVAL  No comp Stable ebl min Full report in pacs

## 2019-05-02 NOTE — Discharge Instructions (Signed)
Please call [facility] [phone] with any questions about your port and G-tube removal site.  You may remove your dressing and shower tomorrow.   Implanted Port Removal, Care After This sheet gives you information about how to care for yourself after your procedure. Your health care provider may also give you more specific instructions. If you have problems or questions, contact your health care provider. What can I expect after the procedure? After the procedure, it is common to have:  Soreness or pain near your incision.  Some swelling or bruising near your incision. Follow these instructions at home: Medicines  Take over-the-counter and prescription medicines only as told by your health care provider.  If you were prescribed an antibiotic medicine, take it as told by your health care provider. Do not stop taking the antibiotic even if you start to feel better. Bathing  Do not take baths, swim, or use a hot tub until your health care provider approves. Ask your health care provider if you can take showers. You may only be allowed to take sponge baths. Incision care   Follow instructions from your health care provider about how to take care of your incision. Make sure you: ? Wash your hands with soap and water before you change your bandage (dressing). If soap and water are not available, use hand sanitizer. ? Change your dressing as told by your health care provider. ? Keep your dressing dry. ? Leave stitches (sutures), skin glue, or adhesive strips in place. These skin closures may need to stay in place for 2 weeks or longer. If adhesive strip edges start to loosen and curl up, you may trim the loose edges. Do not remove adhesive strips completely unless your health care provider tells you to do that.  Check your incision area every day for signs of infection. Check for: ? More redness, swelling, or pain. ? More fluid or blood. ? Warmth. ? Pus or a bad  smell. Driving   Do not drive for 24 hours if you were given a medicine to help you relax (sedative) during your procedure.  If you did not receive a sedative, ask your health care provider when it is safe to drive. Activity  Return to your normal activities as told by your health care provider. Ask your health care provider what activities are safe for you.  Do not lift anything that is heavier than 10 lb (4.5 kg), or the limit that you are told, until your health care provider says that it is safe.  Do not do activities that involve lifting your arms over your head. General instructions  Do not use any products that contain nicotine or tobacco, such as cigarettes and e-cigarettes. These can delay healing. If you need help quitting, ask your health care provider.  Keep all follow-up visits as told by your health care provider. This is important. Contact a health care provider if:  You have more redness, swelling, or pain around your incision.  You have more fluid or blood coming from your incision.  Your incision feels warm to the touch.  You have pus or a bad smell coming from your incision.  You have pain that is not relieved by your pain medicine. Get help right away if you have:  A fever or chills.  Chest pain.  Difficulty breathing. Summary  After the procedure, it is common to have pain, soreness, swelling, or bruising near your incision.  If you were prescribed an antibiotic medicine, take it  as told by your health care provider. Do not stop taking the antibiotic even if you start to feel better.  Do not drive for 24 hours if you were given a sedative during your procedure.  Return to your normal activities as told by your health care provider. Ask your health care provider what activities are safe for you. This information is not intended to replace advice given to you by your health care provider. Make sure you discuss any questions you have with your health  care provider. Document Revised: 04/07/2017 Document Reviewed: 04/07/2017 Elsevier Patient Education  [place].   G-Tube Removal, Care After This sheet gives you information about how to care for yourself after your procedure. Your health care provider may also give you more specific instructions. If you have problems or questions, contact your health care provider. What can I expect after the procedure? After the procedure, it is common to have:  Mild discomfort at the opening in your skin where the tube was removed (tube removal site).  A small amount of drainage from the opening. Follow these instructions at home: Care of the tube removal site  Follow instructions from your health care provider about how to take care of your tube removal site. Make sure you: ? Wash your hands with soap and water before you change your bandage (dressing). If soap and water are not available, use hand sanitizer. ? Change your dressing as told by your health care provider.  Check your tube removal site every day for signs of infection. Check for: ? Redness, swelling, or pain. ? Fluid or blood. ? Warmth. ? Pus or a bad smell.  Do not take baths, swim, or use a hot tub until your health care provider approves. Ask your health care provider if you may take showers. You may only be allowed to take sponge baths. Activity   Return to your normal activities as told by your health care provider. Ask your health care provider what activities are safe for you.  Do not lift anything that is heavier than 10 lb (4.5 kg), or the limit that you are told, until your health care provider says that it is safe. General instructions  Take over-the-counter and prescription medicines only as told by your health care provider.  Follow instructions from your health care provider about eating and drinking.  Keep all follow-up visits as told by your health care provider. This is important. Contact a health  care provider if:  You have a fever.  You have pain in your abdomen.  You have redness, swelling, or pain around your tube removal site.  You have fluid or blood coming from your tube removal site.  Your tube removal site feels warm to the touch.  You have pus or a bad smell coming from your tube removal site.  You have nausea or vomiting. Get help right away if:  You have persistent bleeding from your tube removal site.  You have severe pain in your abdomen.  You are not able to eat or drink anything by mouth. Summary  Follow instructions from your health care provider about how to take care of your tube removal site.  Do not take baths, swim, or use a hot tub until your health care provider approves. Ask your health care provider if you may take showers. You may only be allowed to take sponge baths.  Check your tube removal site every day for signs of infection.  Return to your normal activities  as told by your health care provider. This information is not intended to replace advice given to you by your health care provider. Make sure you discuss any questions you have with your health care provider. Document Revised: 02/04/2017 Document Reviewed: 10/25/2016 Elsevier Patient Education  2020 [employer].

## 2019-05-03 ENCOUNTER — Ambulatory Visit: Payer: Medicare Other | Admitting: Physical Therapy

## 2019-05-03 DIAGNOSIS — R262 Difficulty in walking, not elsewhere classified: Secondary | ICD-10-CM | POA: Diagnosis not present

## 2019-05-03 DIAGNOSIS — R293 Abnormal posture: Secondary | ICD-10-CM | POA: Diagnosis not present

## 2019-05-03 DIAGNOSIS — M6281 Muscle weakness (generalized): Secondary | ICD-10-CM

## 2019-05-03 DIAGNOSIS — R1310 Dysphagia, unspecified: Secondary | ICD-10-CM | POA: Diagnosis not present

## 2019-05-03 DIAGNOSIS — I890 Lymphedema, not elsewhere classified: Secondary | ICD-10-CM | POA: Diagnosis not present

## 2019-05-03 DIAGNOSIS — M542 Cervicalgia: Secondary | ICD-10-CM | POA: Diagnosis not present

## 2019-05-03 NOTE — Patient Instructions (Signed)
Manual lymph drainage for the neck, anterior approach  Sit in front of a mirror. Do 5 slow deep breaths, breathing in through the nose and out through the mouth, letting your belly "inflate" as you breathe in.  Rest your hands on your abdomen as you do this to give slight pressure there.  1) Place hands on areas just behind collar bones and do 10 stationary circles with stretch in outward directions. 2) Do stationary circles at each armpit about 10 times. 3) Place one hand on the front of the opposite shoulder and do stationary circles with stretch downward toward underarm. 4) Repeat #1 above. 5) Imagine a river running in a line from the earlobe straight down the neck.  Place hands just behind this and do circles with stretch coming forward slightly and down, thinking about fluid flowing down that river.  Do 10 times. 6) Place one hand just in front of the river on one side and do circles with a slight back and then downward stretch, thinking again about putting that fluid in the river.  Do 10-20 times on each side. 7) Place one hand just slightly in front of the spot you just did and do the same thing.  DO THIS VERY GENTLY. 8) Use the webspace between your thumb and index finger to pump downward starting just under the chin and "stair stepping" downward with a stretch, working down to the chest. 9) Repeat #1. 10) Do stationary circles on each side of the face just above the chin, out and down with the stretch 10 times. 11) Do stationary circles on each side of the face on the cheeks with pressure going back and down, 10 times. 12) Do stationary circles on each side of the face between the eyes and ears, again back and downward 10 times. 13) Repeat steps 9,8,7,6,5,1,3 and 2 in that order!  Do not slide on the skin, but STRETCH it with your motions. Only give enough pressure to stretch the skin. DO THIS SLOWLY, PLEASE!  And do once a day.  Cancer Rehab [phone]  

## 2019-05-03 NOTE — Therapy (Signed)
[facility] [place], [place], [address] Phone: [phone]   Fax:  [phone]  Physical Therapy Treatment  Patient Details  Name: Neil [name] MRN: [id number] Date of Birth: 01-16-1946 Referring Provider (PT): Reita May Date: 05/03/2019  PT End of Session - 05/03/19 [place]    Visit Number  3    Number of Visits  9    Date for PT Re-Evaluation  05/17/19    PT Start Time  1500    PT Stop Time  1545    PT Time Calculation (min)  45 min    Activity Tolerance  Patient tolerated treatment well    Behavior During Therapy  [facility] for tasks assessed/performed       Past Medical History:  Diagnosis Date  . Atrial fibrillation ([place])   . Chronic anticoagulation    On Eliquis  . GERD (gastroesophageal reflux disease)   . History of radiation therapy 11/16/18- 01/03/19   Head and Neck, Base of Tongue 35 fractions of 2 Gy each for a total of 70 Gy.   Marland Kitchen Hypertension   . Joint pain     Past Surgical History:  Procedure Laterality Date  . HEMORROIDECTOMY  2004  . HERNIA REPAIR  1960  . IR GASTROSTOMY TUBE MOD SED  11/07/2018  . IR GASTROSTOMY TUBE REMOVAL  05/02/2019  . IR IMAGING GUIDED PORT INSERTION  11/07/2018  . IR PATIENT EVAL TECH 0-60 MINS  03/30/2019  . IR REMOVAL TUN ACCESS W/ PORT W/O FL MOD SED  05/02/2019  . IR Avalon TUBE PERCUT W/FLUORO  01/01/2019  . IR US GUIDANCE  09/04/2018  . IR US GUIDE BX ASP/DRAIN  09/04/2018  . MOUTH BIOPSY  09/04/2018   right jaw  . right testical removed    . TONSILLECTOMY      There were no vitals filed for this visit.  Subjective Assessment - 05/03/19 1554    Subjective  Pt has his port and feeding tube out yesterday.  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  No/denies                       [facility] Adult PT Treatment/Exercise - 05/03/19 0001      Manual Therapy   Manual Therapy  Soft tissue mobilization;Manual Lymphatic Drainage (MLD)    Manual therapy comments  instructed pt in technique with hand over hand at neck     Soft tissue mobilization  soft tissue work to bilateral upper traps and posterior cervical muscles.     Manual Lymphatic Drainage (MLD)   performed MLD in sitting in front of mirror and with handout.  5 deep breaths, short neck avoiding port removal site  on right side both axillary circles. anterior neck to chest across clavicular watershed, antero-lateral neck to lateral neck, posterior-lateral neck to lateral neck , cheeks , around ears and more work on anterior neck.                   PT Long Term  [facility] [place], [place], [address] Phone: [phone]   Fax:  [phone]  Physical Therapy Treatment  Patient Details  Name: Neil [name] MRN: [id number] Date of Birth: 01-16-1946 Referring Provider (PT): Reita May Date: 05/03/2019  PT End of Session - 05/03/19 [place]    Visit Number  3    Number of Visits  9    Date for PT Re-Evaluation  05/17/19    PT Start Time  1500    PT Stop Time  1545    PT Time Calculation (min)  45 min    Activity Tolerance  Patient tolerated treatment well    Behavior During Therapy  [facility] for tasks assessed/performed       Past Medical History:  Diagnosis Date  . Atrial fibrillation ([place])   . Chronic anticoagulation    On Eliquis  . GERD (gastroesophageal reflux disease)   . History of radiation therapy 11/16/18- 01/03/19   Head and Neck, Base of Tongue 35 fractions of 2 Gy each for a total of 70 Gy.   Marland Kitchen Hypertension   . Joint pain     Past Surgical History:  Procedure Laterality Date  . HEMORROIDECTOMY  2004  . HERNIA REPAIR  1960  . IR GASTROSTOMY TUBE MOD SED  11/07/2018  . IR GASTROSTOMY TUBE REMOVAL  05/02/2019  . IR IMAGING GUIDED PORT INSERTION  11/07/2018  . IR PATIENT EVAL TECH 0-60 MINS  03/30/2019  . IR REMOVAL TUN ACCESS W/ PORT W/O FL MOD SED  05/02/2019  . IR Avalon TUBE PERCUT W/FLUORO  01/01/2019  . IR US GUIDANCE  09/04/2018  . IR US GUIDE BX ASP/DRAIN  09/04/2018  . MOUTH BIOPSY  09/04/2018   right jaw  . right testical removed    . TONSILLECTOMY      There were no vitals filed for this visit.  Subjective Assessment - 05/03/19 1554    Subjective  Pt has his port and feeding tube out yesterday.  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  No/denies                       [facility] Adult PT Treatment/Exercise - 05/03/19 0001      Manual Therapy   Manual Therapy  Soft tissue mobilization;Manual Lymphatic Drainage (MLD)    Manual therapy comments  instructed pt in technique with hand over hand at neck     Soft tissue mobilization  soft tissue work to bilateral upper traps and posterior cervical muscles.     Manual Lymphatic Drainage (MLD)   performed MLD in sitting in front of mirror and with handout.  5 deep breaths, short neck avoiding port removal site  on right side both axillary circles. anterior neck to chest across clavicular watershed, antero-lateral neck to lateral neck, posterior-lateral neck to lateral neck , cheeks , around ears and more work on anterior neck.                   PT Long Term  Abnormal posture     Problem List Patient Active Problem List   Diagnosis Date Noted  . Port-A-Cath in place 11/22/2018  . CKD (chronic kidney disease), stage III 10/18/2018  . Cancer of base of tongue ([place]) 10/16/2018   [name]. Owens Shark PT  [name] 05/03/2019, 6:35 PM  [facility], [place], [address] Phone: [phone]   Fax:  [phone]  Name: Neil [name] MRN: [id number] Date of Birth: 1945-06-13

## 2019-05-07 ENCOUNTER — Ambulatory Visit: Payer: Medicare Other | Attending: Radiation Oncology

## 2019-05-07 ENCOUNTER — Other Ambulatory Visit: Payer: Self-pay

## 2019-05-07 DIAGNOSIS — R262 Difficulty in walking, not elsewhere classified: Secondary | ICD-10-CM | POA: Insufficient documentation

## 2019-05-07 DIAGNOSIS — I890 Lymphedema, not elsewhere classified: Secondary | ICD-10-CM | POA: Insufficient documentation

## 2019-05-07 DIAGNOSIS — M542 Cervicalgia: Secondary | ICD-10-CM | POA: Diagnosis not present

## 2019-05-07 DIAGNOSIS — M6281 Muscle weakness (generalized): Secondary | ICD-10-CM

## 2019-05-07 DIAGNOSIS — R293 Abnormal posture: Secondary | ICD-10-CM | POA: Diagnosis not present

## 2019-05-07 NOTE — Therapy (Signed)
[facility] [address] [place], [place], [address] Phone: [phone]   Fax:  [phone]  Physical Therapy Treatment  Patient Details  Name: Neil [name] MRN: [id number] Date of Birth: 1946/01/10 Referring Provider (PT): Vertis Kelch Date: 05/07/2019  PT End of Session - 05/07/19 1336    Visit Number  4    Number of Visits  9    Date for PT Re-Evaluation  05/17/19    PT Start Time  1306    PT Stop Time  1415    PT Time Calculation (min)  69 min    Activity Tolerance  Patient tolerated treatment well    Behavior During Therapy  [facility] for tasks assessed/performed       Past Medical History:  Diagnosis Date  . Atrial fibrillation ([facility])   . Chronic anticoagulation    On Eliquis  . GERD (gastroesophageal reflux disease)   . History of radiation therapy 11/16/18- 01/03/19   Head and Neck, Base of Tongue 35 fractions of 2 Gy each for a total of 70 Gy.   Marland Kitchen Hypertension   . Joint pain     Past Surgical History:  Procedure Laterality Date  . HEMORROIDECTOMY  2004  . HERNIA REPAIR  1960  . IR GASTROSTOMY TUBE MOD SED  11/07/2018  . IR GASTROSTOMY TUBE REMOVAL  05/02/2019  . IR IMAGING GUIDED PORT INSERTION  11/07/2018  . IR PATIENT EVAL TECH 0-60 MINS  03/30/2019  . IR REMOVAL TUN ACCESS W/ PORT W/O FL MOD SED  05/02/2019  . IR REPLC GASTRO/COLONIC TUBE PERCUT W/FLUORO  01/01/2019  . IR US GUIDANCE  09/04/2018  . IR US GUIDE BX ASP/DRAIN  09/04/2018  . MOUTH BIOPSY  09/04/2018   right jaw  . right testical removed    . TONSILLECTOMY      There were no vitals filed for this visit.  Subjective Assessment - 05/07/19 1307    Subjective  I've been doing great since having the port and feeding tube out. I was able to clean my whole apartment over the weekend and got a good nights sleep last night for it. Overall I think my energy is coming back.            LYMPHEDEMA/ONCOLOGY QUESTIONNAIRE - 05/07/19 1313      Head  and Neck   4 cm superior to sternal notch around neck  42.3 cm    6 cm superior to sternal notch around neck  43.8 cm    8 cm superior to sternal notch around neck  44.2 cm                [facility] Adult PT Treatment/Exercise - 05/07/19 0001      Exercises   Exercises  Other Exercises    Other Exercises   Began instructing pt in LE strength/balance activites: Standing at counter with +1 HHA for following: Bil tandem stance, bil SLS, heel raises, Rt, then Lt calf stretch in runners pose, and 6" step ups 5x each leg with 3 sec SLS at top of step all with SBA by therapist       Shoulder Exercises: Supine   Horizontal ABduction  Strengthening;Both;10 reps;Theraband    Theraband Level (Shoulder Horizontal ABduction)  Level 2 (Red)    External Rotation  Strengthening;Both;10 reps;Theraband    Theraband Level (Shoulder External Rotation)  Level 2 (Red)    Flexion  Strengthening;Both;5 reps;Theraband   Narrow and Wide Grip, 5 times each  Theraband Level (Shoulder Flexion)  Level 2 (Red)    Diagonals  Strengthening;Right;Left;5 reps;Theraband    Theraband Level (Shoulder Diagonals)  Level 2 (Red)      Manual Therapy   Manual Lymphatic Drainage (MLD)  short neck, supraclavicular fossa, bil shoulder collectors, 5 deep breaths, bil axillary circles. anterior neck to chest across clavicular watershed, antero-lateral neck to lateral neck, posterior-lateral neck to lateral neck , cheeks , around ears and more work on anterior neck.              PT Education - 05/07/19 1457    Education Details  Supine scapular series and standing bil LE strength/balance activities    Person(s) Educated  Patient    Methods  Explanation;Demonstration;Handout    Comprehension  Verbalized understanding;Returned demonstration          PT Long Term Goals - 04/19/19 1724      PT LONG TERM GOAL #1   Title  Pt will be able to complete 13 reps of sit to stands in 30 seconds to improve functional  mobility.    Baseline  9    Time  4    Period  Weeks    Status  New    Target Date  05/17/19      PT LONG TERM GOAL #2   Title  Pt will demonstrate 45 degrees of bilateral lateral cervical flexion to allow pt to return to PLOF.    Baseline  R 25, L 39    Time  4    Period  Weeks    Status  New    Target Date  05/17/19      PT LONG TERM GOAL #3   Title  Pt will receive appropriate compression garments for long term management of neck lymphedema.    Time  4    Period  Weeks    Status  New    Target Date  05/17/19      PT LONG TERM GOAL #4   Title  Pt will be independent with self MLD for long term management of neck lymphedema.    Time  4    Period  Weeks    Status  New    Target Date  05/17/19      PT LONG TERM GOAL #5   Title  Pt will be independent in a home exercise program for continued strengthening and stetching.    Time  4    Period  Weeks    Status  New    Target Date  05/17/19            Plan - 05/07/19 1453    Clinical Impression Statement  Remeasured pts circumference and he demonstrates good reductions since last measured. Progressed pt today to include postural strengthening exercise doing supine scapular series with and without cervical retraction for stability. Pt did not have any c/o pain with exs today. Also issued initial HEP for balance as therapist had extra time due to cancellation. He did very well with these and will use corner of his countertops at home. Pt is very eager to learn all he can about his current functional status and how to improve it.    Personal Factors and Comorbidities  Age;Comorbidity 1    Comorbidities  a-fib    Examination-Activity Limitations  Carry;Reach Overhead;Lift;Squat    Examination-Participation Restrictions  Community Activity;Yard Work;Cleaning;Shop    Stability/Clinical Decision Making  Stable/Uncomplicated    Rehab Potential  Good  PT Frequency  2x / week    PT Duration  4 weeks    PT  Treatment/Interventions  ADLs/Self Care Home Management;Therapeutic activities;Therapeutic exercise;Neuromuscular re-education;Balance training;Manual techniques;Manual lymph drainage;Compression bandaging;Taping;Passive range of motion;Vasopneumatic Device;Joint Manipulations    PT Next Visit Plan  Retest sit to stands; Cont MLD and cont toward exercise to increase shoulder and leg strength;  cervical PROM and soft tissue mobilization, compression garment? once lymphedema goals and cervical ROM goals are met- formally assess LE strength and balance and add goals for these    PT Home Exercise Plan  head and neck ROM exercises, practice sit to stands; supine scapular strength and standing bil LE strength/balance    Consulted and Agree with Plan of Care  Patient       Patient will benefit from skilled therapeutic intervention in order to improve the following deficits and impairments:  Decreased balance, Decreased endurance, Difficulty walking, Decreased knowledge of precautions, Decreased range of motion, Increased edema, Decreased activity tolerance, Decreased strength, Increased fascial restricitons, Impaired UE functional use, Postural dysfunction, Pain  Visit Diagnosis: Lymphedema, not elsewhere classified  Cervicalgia  Difficulty in walking, not elsewhere classified  Muscle weakness (generalized)  Abnormal posture     Problem List Patient Active Problem List   Diagnosis Date Noted  . Port-A-Cath in place 11/22/2018  . CKD (chronic kidney disease), stage III 10/18/2018  . Cancer of base of tongue ([facility]) 10/16/2018    Hermenia Bers, PTA 05/07/2019, 3:02 PM  [facility] [address] [place], [place], [address] Phone: [phone]   Fax:  [phone]  Name: Neil [name] MRN: [id number] Date of Birth: 27-Mar-1945

## 2019-05-07 NOTE — Patient Instructions (Addendum)
CAN ADD NECK TUCK TO ALL EXERCISES  Over Head Pull: Narrow and Wide Grip   Cancer Rehab [phone]   On back, knees bent, feet flat, band across thighs, elbows straight but relaxed. Pull hands apart (start). Keeping elbows straight, bring arms up and over head, hands toward floor. Keep pull steady on band. Hold momentarily. Return slowly, keeping pull steady, back to start. Then do same with a wider grip on the band (past shoulder width) Repeat _5-10__ times. Band color __red____   Side Pull: Double Arm   On back, knees bent, feet flat. Arms perpendicular to body, shoulder level, elbows straight but relaxed. Pull arms out to sides, elbows straight. Resistance band comes across collarbones, hands toward floor. Hold momentarily. Slowly return to starting position. Repeat _5-10__ times. Band color _red____   Sword   On back, knees bent, feet flat, left hand on left hip, right hand above left. Pull right arm DIAGONALLY (hip to shoulder) across chest. Bring right arm along head toward floor. Hold momentarily. Slowly return to starting position. Repeat _5-10__ times. Do with left arm. Band color __red____   Shoulder Rotation: Double Arm   On back, knees bent, feet flat, elbows tucked at sides, bent 90, hands palms up. Pull hands apart and down toward floor, keeping elbows near sides. Hold momentarily. Slowly return to starting position. Repeat _5-10__ times. Band color __red____   Heel Raise: Bilateral (Standing)      Stand near counter for fingertip support if needed. Rise on balls of feet. Repeat __10-20__ times per set. Do _1-2___ sets per session. Do __2__ sessions per day.  Calf Stretch    Place one leg forward, bent, other leg behind and straight. Lean forward keeping back heel flat. Hold __20__ seconds while counting out loud. Repeat with other leg forward. Repeat __3__ times. Do __2-3__ sessions per day.   SINGLE LIMB STANCE    Stand at counter (corner if you have one)  for minimal arm support. Raise leg. Hold _20__ seconds. Repeat with other leg. Once this becomes easier, for increased challenge, close eyes with fingertips on counter for safety. _3-5__ reps per set, _2-3__ sets per day.   Tandem Stance    Stand at counter (corner if you have one). Right foot in front of left, heel touching toe both feet "straight ahead".  Balance in this position _20__ seconds. Do with left foot in front of right. Once this becomes easier, for increased challenge, close eyes with fingertips on counter for safety.  Step-Up: Forward    Move step-stool to counter. Step up forward keeping opposite foot off step. Keep pelvis level and back straight. Hold the single leg stand for 3 seconds, then come back down slowly.  Do _10__ times, do _1-2_ sets, on each leg, _1-2__ times per day.

## 2019-05-09 ENCOUNTER — Other Ambulatory Visit: Payer: Self-pay

## 2019-05-09 ENCOUNTER — Ambulatory Visit: Payer: Medicare Other | Admitting: Physical Therapy

## 2019-05-09 DIAGNOSIS — M542 Cervicalgia: Secondary | ICD-10-CM

## 2019-05-09 DIAGNOSIS — R262 Difficulty in walking, not elsewhere classified: Secondary | ICD-10-CM | POA: Diagnosis not present

## 2019-05-09 DIAGNOSIS — R293 Abnormal posture: Secondary | ICD-10-CM

## 2019-05-09 DIAGNOSIS — M6281 Muscle weakness (generalized): Secondary | ICD-10-CM | POA: Diagnosis not present

## 2019-05-09 DIAGNOSIS — I890 Lymphedema, not elsewhere classified: Secondary | ICD-10-CM | POA: Diagnosis not present

## 2019-05-09 NOTE — Patient Instructions (Signed)
Flexion (Eccentric) - Active-Assist (Cane)          Cancer Rehab [phone]    Use unaffected arm to push affected arm forward. Avoid hiking shoulder (shoulder should NOT touch cheek). Keep palm relaxed. Slowly lower affected arm. Hold stretch for _5_ seconds repeating _5-10_ times, _1-2_ times a day.  Abduction (Eccentric) - Active-Assist (Cane)    Use unaffected arm to push affected arm out to side. Avoid hiking shoulder (shoulder should NOT touch cheek). Keep palm relaxed. Slowly lower affected arm. Hold stretch _5_ seconds repeating _5-10_ times, _1-2_ times a day.  Cane Exercise: Extension   Stand holding cane behind back with both hands palm-up. Lift the cane away from body until gentle stretch felt. Do NOT lean forward.  Hold __5__ seconds. Repeat _5-10___ times. Do _1-2___ sessions per day.    

## 2019-05-09 NOTE — Therapy (Signed)
[facility] [address] [place], [place], [address] Phone: [phone]   Fax:  [phone]  Physical Therapy Treatment  Patient Details  Name: Neil [name] MRN: [id number] Date of Birth: 08-31-1945 Referring Provider (PT): Vertis Kelch Date: 05/09/2019  PT End of Session - 05/09/19 1348    Visit Number  5    Number of Visits  9    Date for PT Re-Evaluation  05/17/19    PT Start Time  1300    PT Stop Time  1345    PT Time Calculation (min)  45 min    Activity Tolerance  Patient tolerated treatment well    Behavior During Therapy  [facility] for tasks assessed/performed       Past Medical History:  Diagnosis Date  . Atrial fibrillation (HCC)   . Chronic anticoagulation    On Eliquis  . GERD (gastroesophageal reflux disease)   . History of radiation therapy 11/16/18- 01/03/19   Head and Neck, Base of Tongue 35 fractions of 2 Gy each for a total of 70 Gy.   Marland Kitchen Hypertension   . Joint pain     Past Surgical History:  Procedure Laterality Date  . HEMORROIDECTOMY  2004  . HERNIA REPAIR  1960  . IR GASTROSTOMY TUBE MOD SED  11/07/2018  . IR GASTROSTOMY TUBE REMOVAL  05/02/2019  . IR IMAGING GUIDED PORT INSERTION  11/07/2018  . IR PATIENT EVAL TECH 0-60 MINS  03/30/2019  . IR REMOVAL TUN ACCESS W/ PORT W/O FL MOD SED  05/02/2019  . IR REPLC GASTRO/COLONIC TUBE PERCUT W/FLUORO  01/01/2019  . IR US GUIDANCE  09/04/2018  . IR US GUIDE BX ASP/DRAIN  09/04/2018  . MOUTH BIOPSY  09/04/2018   right jaw  . right testical removed    . TONSILLECTOMY      There were no vitals filed for this visit.  Subjective Assessment - 05/09/19 1305    Subjective  Pt was very happy to do exercises and has been practicing them at home.  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, 11/2018-12/2018 completed chemoradiation, A-fib                        [facility] Adult PT Treatment/Exercise - 05/09/19 0001      Exercises   Exercises  Neck;Shoulder;Knee/Hip      Neck Exercises: Seated   Other Seated Exercise  neck ROM in flexion, extension, rotation and side bending  with cues for good posture, also thoracic mobilty with seated cat/cows    pt with stiffness in trunk rotation      Knee/Hip Exercises: Supine   Bridges  Strengthening;10 reps    Straight Leg Raises  Strengthening;Right;Left;10 reps    Straight Leg Raises Limitations  pt only able to lift leg about an inch before he has a cramp in his leg       Shoulder Exercises: Standing   Other Standing Exercises  standing dowel flexion, abuduction and extension x 10 reps       Manual Therapy   Manual Lymphatic Drainage (MLD)  briefly, short neck, supraclavicular fossa, bil shoulder collectors, 5 deep breaths, bil axillary circles. anterior neck to chest across clavicular watershed, antero-lateral neck to lateral neck, posterior-lateral neck to lateral neck , cheeks , around ears and more work on anterior neck.  PT Long Term Goals - 04/19/19 1724      PT LONG TERM GOAL #1   Title  Pt will be able to complete 13 reps of sit to stands in 30 seconds to improve functional mobility.    Baseline  9    Time  4    Period  Weeks    Status  New    Target Date  05/17/19      PT LONG TERM GOAL #2   Title  Pt will demonstrate 45 degrees of bilateral lateral cervical flexion to allow pt to return to PLOF.    Baseline  R 25, L 39    Time  4    Period  Weeks    Status  New    Target Date  05/17/19      PT LONG TERM GOAL #3   Title  Pt will receive appropriate compression garments for long term management of neck lymphedema.    Time  4    Period  Weeks    Status  New    Target Date  05/17/19      PT LONG TERM GOAL #4   Title  Pt will be independent with self MLD for long term management of neck lymphedema.    Time  4     Period  Weeks    Status  New    Target Date  05/17/19      PT LONG TERM GOAL #5   Title  Pt will be independent in a home exercise program for continued strengthening and stetching.    Time  4    Period  Weeks    Status  New    Target Date  05/17/19            Plan - 05/09/19 1355    Clinical Impression Statement  Pt is making good progress and is increasing his activity at home.  He will continue with PT 1-2 times a week to make sure that his garment fits well and he can control his lymphedema at home as well as progress exercises.  He will look into doing tai chi exericse on the internet Exercise today focused on posture and shoulder ROM    Personal Factors and Comorbidities  Age;Comorbidity 1    Comorbidities  a-fib    Examination-Activity Limitations  Carry;Reach Overhead;Lift;Squat    Stability/Clinical Decision Making  Stable/Uncomplicated    Rehab Potential  Good    PT Frequency  2x / week    PT Next Visit Plan  Retest sit to stands, do recert  add medbridge program  Cont MLD and cont toward exercise to increase shoulder and leg strength;  cervical PROM and soft tissue mobilization, compression garment? once lymphedema goals and cervical ROM goals are met- formally assess LE strength and balance and add goals for these    PT Home Exercise Plan  head and neck ROM exercises, practice sit to stands; supine scapular strength and standing bil LE strength/balance    Consulted and Agree with Plan of Care  Patient       Patient will benefit from skilled therapeutic intervention in order to improve the following deficits and impairments:     Visit Diagnosis: Lymphedema, not elsewhere classified  Cervicalgia  Difficulty in walking, not elsewhere classified  Muscle weakness (generalized)  Abnormal posture     Problem List Patient Active Problem List   Diagnosis Date Noted  . Port-A-Cath in place 11/22/2018  . CKD (chronic kidney disease),  stage III 10/18/2018  .  Cancer of base of tongue (HCC) 10/16/2018   Bayard Hugger. Manson Passey PT  Donnetta Hail 05/09/2019, 1:58 PM  [facility] [address] [place], [place], [address] Phone: [phone]   Fax:  [phone]  Name: Neil [name] MRN: [id number] Date of Birth: 1945-09-14

## 2019-05-13 NOTE — Progress Notes (Signed)
Cardiology Office Note:   Date:  05/14/2019  NAME:  Neil [name]    MRN: [id number] DOB:  1945/09/12   PCP:  Verlon Au, MD  Cardiologist:  Reatha Harps, MD   Referring MD: Verlon Au, MD   Chief Complaint  Patient presents with  . Atrial Fibrillation    History of Present Illness:   Neil [name] is a 74 y.o. male with a hx of SCC tongue, Afib, HTN who presents for follow-up of Afib. Was seen a few months ago. Failed several cardioversions in the past. We stopped his amiodarone and he will continue  Eliquis.  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  His PEG tube has been removed.  He reports he is doing quite well.  He is continuing to exercise with therapy.  He reports he is walking up to 1/2 mile daily.  He is continuing to exercise with therapy.  He reports he is walking up to 1/2 mile per day.  He reports he was a bit dizzy taking 100 mg of metoprolol succinate daily.  He reduce the dose to 50 mg daily and is doing well.  He denies any palpitations, shortness of breath, chest pain today.  He has no significant bleeding with Eliquis.  Blood pressure well controlled today.  He is without complaints.  Problem list 1. Atrial fibrillation, permanent  -failed multiple cardioversion  2. HTN 3. SCC tongue   Past Medical History: Past Medical History:  Diagnosis Date  . Atrial fibrillation (HCC)   . Chronic anticoagulation    On Eliquis  . GERD (gastroesophageal reflux disease)   . History of radiation therapy 11/16/18- 01/03/19   Head and Neck, Base of Tongue 35 fractions of 2 Gy each for a total of 70 Gy.   Marland Kitchen Hypertension   . Joint pain     Past Surgical History: Past Surgical History:  Procedure Laterality Date  . HEMORROIDECTOMY  2004  . HERNIA REPAIR  1960  . IR GASTROSTOMY TUBE MOD SED  11/07/2018  . IR GASTROSTOMY TUBE REMOVAL  05/02/2019  . IR IMAGING GUIDED PORT INSERTION  11/07/2018  . IR PATIENT EVAL TECH 0-60  MINS  03/30/2019  . IR REMOVAL TUN ACCESS W/ PORT W/O FL MOD SED  05/02/2019  . IR REPLC GASTRO/COLONIC TUBE PERCUT W/FLUORO  01/01/2019  . IR US GUIDANCE  09/04/2018  . IR US GUIDE BX ASP/DRAIN  09/04/2018  . MOUTH BIOPSY  09/04/2018   right jaw  . right testical removed    . TONSILLECTOMY      Current Medications: Current Meds  Medication Sig  . hydrochlorothiazide (HYDRODIURIL) 25 MG tablet Take 25 mg by mouth daily.  . metoprolol succinate (TOPROL-XL) 50 MG 24 hr tablet Take 1 tablet (50 mg total) by mouth daily. Take with or immediately following a meal.  . Nutritional Supplements (FEEDING SUPPLEMENT, OSMOLITE 1.5 CAL,) LIQD 1 1/2 bottles Osmolite 1.5 via PEG with 60 mL water before and after each bolus feeding QID. 1 OZ Prosource or equivalent via PEG QID with 60 mL water flush after. Provides 2530 kcal, 129 gm Pro: >90% estimated needs. Please send formula, Prosource and supplies.  Marland Kitchen omeprazole (PRILOSEC) 20 MG capsule Take 20 mg by mouth daily.  . tamsulosin (FLOMAX) 0.4 MG CAPS capsule Take 0.4 mg by mouth daily after supper.  . [DISCONTINUED] Apixaban (ELIQUIS PO) Take 5 mg by mouth 2 (two) times daily.   . [DISCONTINUED] metoprolol  succinate (TOPROL-XL) 100 MG 24 hr tablet Take 1 tablet (100 mg total) by mouth daily. Take with or immediately following a meal. (Patient taking differently: Take 50 mg by mouth daily. Take with or immediately following a meal.)     Allergies:    Lisinopril   Social History: Social History   Socioeconomic History  . Marital status: Divorced    Spouse name: Not on file  . Number of children: 1  . Years of education: Not on file  . Highest education level: Not on file  Occupational History  . Not on file  Tobacco Use  . Smoking status: Former Smoker    Packs/day: 1.00    Years: 5.00    Pack years: 5.00    Quit date: 1970    Years since quitting: 51.2  . Smokeless tobacco: Never Used  Substance and Sexual Activity  . Alcohol use: Not  Currently    Comment: none since recent diagnosis.   . Drug use: No  . Sexual activity: Not on file  Other Topics Concern  . Not on file  Social History Narrative   Patient is divorced with 1 daughter living in [place].   Patient with history of smoking approximately 1 pack/day for 5 years.  Patient quit in 1970.   Patient has never used smokeless tobacco.   Patient with a history of drinking wine occasionally.  None since June 2020.   Patient denies use of illicit drugs.   Social Determinants of Health   Financial Resource Strain:   . Difficulty of Paying Living Expenses: Not on file  Food Insecurity:   . Worried About Programme researcher, broadcasting/film/video in the Last Year: Not on file  . Ran Out of Food in the Last Year: Not on file  Transportation Needs: No Transportation Needs  . Lack of Transportation (Medical): No  . Lack of Transportation (Non-Medical): No  Physical Activity:   . Days of Exercise per Week: Not on file  . Minutes of Exercise per Session: Not on file  Stress:   . Feeling of Stress : Not on file  Social Connections:   . Frequency of Communication with Friends and Family: Not on file  . Frequency of Social Gatherings with Friends and Family: Not on file  . Attends Religious Services: Not on file  . Active Member of Clubs or Organizations: Not on file  . Attends Banker Meetings: Not on file  . Marital Status: Not on file     Family History: The patient's family history includes Alzheimer's disease in his mother.  ROS:   All other ROS reviewed and negative. Pertinent positives noted in the HPI.     EKGs/Labs/Other Studies Reviewed:   The following studies were personally reviewed by me today:  TTE 04/05/2019 1. Left ventricular ejection fraction, by visual estimation, is 55 to  60%. The left ventricle has normal function. There is mildly increased  left ventricular hypertrophy.  2. Left ventricular diastolic function could not be evaluated.  3.  The left ventricle has no regional wall motion abnormalities.  4. Global right ventricle has normal systolic function.The right  ventricular size is normal.  5. Left atrial size was moderately dilated.  6. Right atrial size was mildly dilated.  7. The mitral valve is normal in structure. Mild mitral valve  regurgitation. No evidence of mitral stenosis.  8. The tricuspid valve is normal in structure. Tricuspid valve  regurgitation is mild.  9. The aortic valve is  tricuspid. Aortic valve regurgitation is mild.  Mild aortic valve sclerosis without stenosis.  10. The pulmonic valve was grossly normal. Pulmonic valve regurgitation is  trivial.  11. Aortic dilatation noted.  12. There is mild dilatation of the ascending aorta measuring 40 mm.  13. The inferior vena cava is normal in size with greater than 50%  respiratory variability, suggesting right atrial pressure of 3 mmHg.  14. Normal LV function; mild LVH; mildly dilated ascending aorta; mild AI  and MR; biatrial enlargement.   Recent Labs: 12/27/2018: Magnesium 2.0 04/18/2019: ALT 8; TSH 0.528 05/02/2019: BUN 11; Creatinine, Ser 1.05; Hemoglobin 16.4; Platelets 150; Potassium 3.2; Sodium 142   Recent Lipid Panel No results found for: CHOL, TRIG, HDL, CHOLHDL, VLDL, LDLCALC, LDLDIRECT  Physical Exam:   VS:  BP (!) 142/72   Pulse (!) 49   Ht 5\' 10"  (1.778 m)   Wt 223 lb 3.2 oz (101.2 kg)   SpO2 100%   BMI 32.03 kg/m    Wt Readings from Last 3 Encounters:  05/14/19 223 lb 3.2 oz (101.2 kg)  04/18/19 223 lb 9.6 oz (101.4 kg)  04/17/19 226 lb 3.2 oz (102.6 kg)    General: Well nourished, well developed, in no acute distress Heart: Atraumatic, normal size  Eyes: PEERLA, EOMI  Neck: Supple, no JVD Endocrine: No thryomegaly Cardiac: Normal S1, S2; RRR; no murmurs, rubs, or gallops Lungs: Clear to auscultation bilaterally, no wheezing, rhonchi or rales  Abd: Soft, nontender, no hepatomegaly  Ext: No edema, pulses  2+ Musculoskeletal: No deformities, BUE and BLE strength normal and equal Skin: Warm and dry, no rashes   Neuro: Alert and oriented to person, place, time, and situation, CNII-XII grossly intact, no focal deficits  Psych: Normal mood and affect   ASSESSMENT:   Neil [name] is a 74 y.o. male who presents for the following: 1. Permanent atrial fibrillation (HCC)   2. Essential hypertension     PLAN:   1. Permanent atrial fibrillation [facility]) -He has had several failed cardioversions at [facility] in the past.  His left atrium is moderately dilated on echocardiogram.  He has normal left ventricular function.  He has no symptoms from his atrial fibrillation. -When I saw him a few months ago I recommended rate control strategy for his atrial fibrillation.  We will continue metoprolol succinate 50 mg daily.  He also will continue Eliquis 5 mg twice daily. (CHADSVASC =3).   2. Essential hypertension -Well-controlled today.  We will continue metoprolol succinate 50 mg daily and HCTZ 25 mg daily.  Disposition: Return in about 6 months (around 11/14/2019).  Medication Adjustments/Labs and Tests Ordered: Current medicines are reviewed at length with the patient today.  Concerns regarding medicines are outlined above.  No orders of the defined types were placed in this encounter.  Meds ordered this encounter  Medications  . metoprolol succinate (TOPROL-XL) 50 MG 24 hr tablet    Sig: Take 1 tablet (50 mg total) by mouth daily. Take with or immediately following a meal.    Dispense:  90 tablet    Refill:  3  . apixaban (ELIQUIS) 5 MG TABS tablet    Sig: Take 1 tablet (5 mg total) by mouth 2 (two) times daily.    Dispense:  60 tablet    Refill:  3    Patient Instructions  Medication Instructions:  Decrease Metoprolol Succinate to 50 mg daily   *If you need a refill on your cardiac medications before your next  appointment, please call your pharmacy*   Follow-Up: At [facility],  you and your health needs are our priority.  As part of our continuing mission to provide you with exceptional heart care, we have created designated Provider Care Teams.  These Care Teams include your primary Cardiologist (physician) and Advanced Practice Providers (APPs -  Physician Assistants and Nurse Practitioners) who all work together to provide you with the care you need, when you need it.  We recommend signing up for the patient portal called "MyChart".  Sign up information is provided on this After Visit Summary.  MyChart is used to connect with patients for Virtual Visits (Telemedicine).  Patients are able to view lab/test results, encounter notes, upcoming appointments, etc.  Non-urgent messages can be sent to your provider as well.   To learn more about what you can do with MyChart, go to [email or web address].    Your next appointment:   6 month(s)  The format for your next appointment:   In Person  Provider:   Lennie Odor, MD       Time Spent with Patient: I have spent a total of 35 minutes with patient reviewing hospital notes, telemetry, EKGs, labs and examining the patient as well as establishing an assessment and plan that was discussed with the patient.  > 50% of time was spent in direct patient care.  Signed, Lenna Gilford. Flora Lipps, MD [facility]  [address] [place], [place] [address] [phone]  05/14/2019 1:12 PM

## 2019-05-14 ENCOUNTER — Other Ambulatory Visit: Payer: Self-pay

## 2019-05-14 ENCOUNTER — Encounter: Payer: Self-pay | Admitting: Cardiovascular Disease

## 2019-05-14 ENCOUNTER — Ambulatory Visit (INDEPENDENT_AMBULATORY_CARE_PROVIDER_SITE_OTHER): Payer: Medicare Other | Admitting: Cardiovascular Disease

## 2019-05-14 VITALS — BP 142/72 | HR 49 | Ht 70.0 in | Wt 223.2 lb

## 2019-05-14 DIAGNOSIS — I4821 Permanent atrial fibrillation: Secondary | ICD-10-CM

## 2019-05-14 DIAGNOSIS — I10 Essential (primary) hypertension: Secondary | ICD-10-CM

## 2019-05-14 MED ORDER — METOPROLOL SUCCINATE ER 50 MG PO TB24: 50.0000 mg | ORAL_TABLET | Freq: Every day | ORAL | 3 refills | Status: DC

## 2019-05-14 MED ORDER — APIXABAN 5 MG PO TABS: 5.0000 mg | ORAL_TABLET | Freq: Two times a day (BID) | ORAL | 3 refills | Status: DC

## 2019-05-14 NOTE — Patient Instructions (Signed)
Medication Instructions:  Decrease Metoprolol Succinate to 50 mg daily   *If you need a refill on your cardiac medications before your next appointment, please call your pharmacy*   Follow-Up: At [facility], you and your health needs are our priority.  As part of our continuing mission to provide you with exceptional heart care, we have created designated Provider Care Teams.  These Care Teams include your primary Cardiologist (physician) and Advanced Practice Providers (APPs -  Physician Assistants and Nurse Practitioners) who all work together to provide you with the care you need, when you need it.  We recommend signing up for the patient portal called "MyChart".  Sign up information is provided on this After Visit Summary.  MyChart is used to connect with patients for Virtual Visits (Telemedicine).  Patients are able to view lab/test results, encounter notes, upcoming appointments, etc.  Non-urgent messages can be sent to your provider as well.   To learn more about what you can do with MyChart, go to [email or web address].    Your next appointment:   6 month(s)  The format for your next appointment:   In Person  Provider:   Eleonore Chiquito, MD

## 2019-05-16 ENCOUNTER — Other Ambulatory Visit: Payer: Self-pay

## 2019-05-16 ENCOUNTER — Ambulatory Visit: Payer: Medicare Other

## 2019-05-16 DIAGNOSIS — R293 Abnormal posture: Secondary | ICD-10-CM | POA: Diagnosis not present

## 2019-05-16 DIAGNOSIS — I890 Lymphedema, not elsewhere classified: Secondary | ICD-10-CM

## 2019-05-16 DIAGNOSIS — M542 Cervicalgia: Secondary | ICD-10-CM

## 2019-05-16 DIAGNOSIS — R262 Difficulty in walking, not elsewhere classified: Secondary | ICD-10-CM | POA: Diagnosis not present

## 2019-05-16 DIAGNOSIS — M6281 Muscle weakness (generalized): Secondary | ICD-10-CM | POA: Diagnosis not present

## 2019-05-16 NOTE — Therapy (Signed)
[facility] [address] [place], [place], [address] Phone: [phone]   Fax:  [phone]  Physical Therapy Treatment  Patient Details  Name: Neil [name] MRN: [id number] Date of Birth: 07-07-1945 Referring Provider (PT): Vertis Kelch Date: 05/16/2019  PT End of Session - 05/16/19 1006    Visit Number  6    Number of Visits  9    Date for PT Re-Evaluation  05/17/19    PT Start Time  0905    PT Stop Time  1004    PT Time Calculation (min)  59 min    Activity Tolerance  Patient tolerated treatment well    Behavior During Therapy  [facility] for tasks assessed/performed       Past Medical History:  Diagnosis Date  . Atrial fibrillation (HCC)   . Chronic anticoagulation    On Eliquis  . GERD (gastroesophageal reflux disease)   . History of radiation therapy 11/16/18- 01/03/19   Head and Neck, Base of Tongue 35 fractions of 2 Gy each for a total of 70 Gy.   Marland Kitchen Hypertension   . Joint pain     Past Surgical History:  Procedure Laterality Date  . HEMORROIDECTOMY  2004  . HERNIA REPAIR  1960  . IR GASTROSTOMY TUBE MOD SED  11/07/2018  . IR GASTROSTOMY TUBE REMOVAL  05/02/2019  . IR IMAGING GUIDED PORT INSERTION  11/07/2018  . IR PATIENT EVAL TECH 0-60 MINS  03/30/2019  . IR REMOVAL TUN ACCESS W/ PORT W/O FL MOD SED  05/02/2019  . IR REPLC GASTRO/COLONIC TUBE PERCUT W/FLUORO  01/01/2019  . IR US GUIDANCE  09/04/2018  . IR US GUIDE BX ASP/DRAIN  09/04/2018  . MOUTH BIOPSY  09/04/2018   right jaw  . right testical removed    . TONSILLECTOMY      There were no vitals filed for this visit.  Subjective Assessment - 05/16/19 0916    Subjective  I am able to walk around the grocery store or [employer] now without my cane. I can tell my endurance has really improved. I saw my cardiologist Monday and he said everything looks good, along with my A-fib is well controlled. And he said I can start taking my CoQ-10 again. I've hooked my  computer up to my TV so I'll be able to do the [place] classes on youtube and have found those.    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  No/denies         [facility] PT Assessment - 05/16/19 0001      Transfers   Transfers  Sit to Stand    Sit to Stand  Without upper extremity assist   11 reps in 30 sec                  [facility] Adult PT Treatment/Exercise - 05/16/19 0001      Self-Care   Self-Care  Other Self-Care Comments    Other Self-Care Comments   Spent session today answering pts questions about his HEP, safe progression with balance, and how to best incorporate these into his day without him feeling overwhelmed. Also reissued these on Medbridge as pt reports liking to look things up on and use his computer.  Also now has it hooked it up to his TV so he can view on larger screen. Also educated him how to do some of exercises with HIIT (high intensity interval training) which he was encouraged to learn as he could focus on a few exercises each day this way.                   PT Long Term Goals - 05/16/19 0948      PT LONG TERM GOAL #1   Title  Pt will be able to complete 13 reps of sit to stands in 30 seconds to improve functional mobility.    Baseline  9; 11 reps - 05/16/19    Status  On-going      PT LONG TERM GOAL #2   Title  Pt will demonstrate 45 degrees of bilateral lateral cervical flexion to allow pt to return to PLOF.    Baseline  R 25, L 39; Rt 49 and Lt 52 - 05/16/19    Status  Achieved      PT LONG TERM GOAL #3   Title  Pt will receive appropriate compression garments for long term management of neck lymphedema.    Baseline  Head/neck garment has been ordered - 05/16/19    Status  Partially Met      PT LONG TERM GOAL #4   Title  Pt  will be independent with self MLD for long term management of neck lymphedema.    Baseline  Pt is independent with self manual lymph drainage and compression garment is on order, also wears chip pack daily-05/16/19    Status  Achieved      PT LONG TERM GOAL #5   Title  Pt will be independent in a home exercise program for continued strengthening and stetching.    Baseline  Pt is becoming independent with HEP and we are progressing this prn each session - 05/16/19    Status  On-going            Plan - 05/16/19 0947    Clinical Impression Statement  Today spent session answering pts questions about how frequent he should exercise and how to incorporate this into his day. Also switched most of his HEP to Medbridge as pt likes using his computer and now has it hooled up to his TV so he can view on a larger screen. He has looked up and found the [employer] exercises but did not get to start them yet though reports did used to do these about a year ago. Also educated pt on how he could also do some of the exercises using HIIT. He seemed to like this idea as well, picking a few exercisees at a time and focusing on those. Pt reports being encouraged after session today as he was starting to feel overwhelmed with HEP. Also reminded him we are focusing on mutliple things with him like lymphedema, leg strength, and balance which require there own HEP. Pt verbalized understanding all. Retested his sit-stand which has improved from 9 to 11 reps since eval showing that he is doing well with his HEP currently and improving his strength and endurance.    Personal Factors and Comorbidities  Age;Comorbidity 1    Comorbidities  a-fib    Examination-Activity Limitations  Carry;Reach Overhead;Lift;Squat    Examination-Participation Restrictions  Community Activity;Yard Work;Cleaning;Shop    Stability/Clinical Decision Making  Stable/Uncomplicated    Rehab Potential  Good    PT Frequency  2x /  week    PT Duration  4  weeks    PT Treatment/Interventions  ADLs/Self Care Home Management;Therapeutic activities;Therapeutic exercise;Neuromuscular re-education;Balance training;Manual techniques;Manual lymph drainage;Compression bandaging;Taping;Passive range of motion;Vasopneumatic Device;Joint Manipulations    PT Next Visit Plan  Renewal next session/need balance or Le strength goal (per last PT note)? Pt would like to cont 1x/wk for HEP progression and assessment of technique. How is HEP going? Review exercises prn and see if pt has any questions. Did new compression garment arrive?/assess fit.    PT Home Exercise Plan  head and neck ROM exercises, practice sit to stands; supine scapular strength and standing bil LE strength/balance; MedBridge code [id number]    Consulted and Agree with Plan of Care  Patient       Patient will benefit from skilled therapeutic intervention in order to improve the following deficits and impairments:  Decreased balance, Decreased endurance, Difficulty walking, Decreased knowledge of precautions, Decreased range of motion, Increased edema, Decreased activity tolerance, Decreased strength, Increased fascial restricitons, Impaired UE functional use, Postural dysfunction, Pain  Visit Diagnosis: Lymphedema, not elsewhere classified  Cervicalgia  Difficulty in walking, not elsewhere classified  Muscle weakness (generalized)  Abnormal posture     Problem List Patient Active Problem List   Diagnosis Date Noted  . Port-A-Cath in place 11/22/2018  . CKD (chronic kidney disease), stage III 10/18/2018  . Cancer of base of tongue (HCC) 10/16/2018    Hermenia Bers, PTA 05/16/2019, 12:53 PM  [facility] [address] [place], [place], [address] Phone: [phone]   Fax:  [phone]  Name: Neil [name] MRN: [id number] Date of Birth: 1945-07-14

## 2019-05-23 ENCOUNTER — Encounter: Payer: Self-pay | Admitting: Physical Therapy

## 2019-05-23 ENCOUNTER — Other Ambulatory Visit: Payer: Self-pay

## 2019-05-23 ENCOUNTER — Ambulatory Visit: Payer: Medicare Other | Admitting: Physical Therapy

## 2019-05-23 DIAGNOSIS — I890 Lymphedema, not elsewhere classified: Secondary | ICD-10-CM

## 2019-05-23 DIAGNOSIS — R262 Difficulty in walking, not elsewhere classified: Secondary | ICD-10-CM | POA: Diagnosis not present

## 2019-05-23 DIAGNOSIS — R293 Abnormal posture: Secondary | ICD-10-CM | POA: Diagnosis not present

## 2019-05-23 DIAGNOSIS — M6281 Muscle weakness (generalized): Secondary | ICD-10-CM | POA: Diagnosis not present

## 2019-05-23 DIAGNOSIS — M542 Cervicalgia: Secondary | ICD-10-CM

## 2019-05-23 NOTE — Therapy (Signed)
[place], [place], [address] Phone: [phone]   Fax:  [phone]  Physical Therapy Treatment  Patient Details  Name: Neil [name] MRN: [id number] Date of Birth: 1945-06-24 Referring Provider (PT): Reita May Date: 05/23/2019  PT End of Session - 05/23/19 1529    Visit Number  7    Number of Visits  17    Date for PT Re-Evaluation  07/18/19    PT Start Time  1400    PT Stop Time  1445    PT Time Calculation (min)  45 min    Activity Tolerance  Patient tolerated treatment well    Behavior During Therapy  [facility] for tasks assessed/performed       Past Medical History:  Diagnosis Date  . Atrial fibrillation ([place])   . Chronic anticoagulation    On Eliquis  . GERD (gastroesophageal reflux disease)   . History of radiation therapy 11/16/18- 01/03/19   Head and Neck, Base of Tongue 35 fractions of 2 Gy each for a total of 70 Gy.   Marland Kitchen Hypertension   . Joint pain     Past Surgical History:  Procedure Laterality Date  . HEMORROIDECTOMY  2004  . HERNIA REPAIR  1960  . IR GASTROSTOMY TUBE MOD SED  11/07/2018  . IR GASTROSTOMY TUBE REMOVAL  05/02/2019  . IR IMAGING GUIDED PORT INSERTION  11/07/2018  . IR PATIENT EVAL TECH 0-60 MINS  03/30/2019  . IR REMOVAL TUN ACCESS W/ PORT W/O FL MOD SED  05/02/2019  . IR [place] TUBE PERCUT W/FLUORO  01/01/2019  . IR US GUIDANCE  09/04/2018  . IR US GUIDE BX ASP/DRAIN  09/04/2018  . MOUTH BIOPSY  09/04/2018   right jaw  . right testical removed    . TONSILLECTOMY      There were no vitals filed for this visit.  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  No/denies         [facility] PT Assessment - 05/23/19 0001      Assessment   Medical Diagnosis  base of tongue cancer    Referring Provider (PT)  Isidore Moos    Onset Date/Surgical Date  08/07/18      Prior Function   Level of Independence  Independent        LYMPHEDEMA/ONCOLOGY QUESTIONNAIRE - 05/23/19 1409      Head and Neck   4 cm superior to sternal notch around neck  43 cm    6 cm superior to sternal notch around neck  44.8 cm    8 cm superior to sternal notch around neck  46.2 cm                [facility] Adult PT Treatment/Exercise - 05/23/19 0001      Manual Therapy   Manual Therapy  Edema management;Manual Lymphatic Drainage (MLD)    Edema Management  remeasured neck circumference     Manual Lymphatic Drainage (MLD)  short  [place], [place], [address] Phone: [phone]   Fax:  [phone]  Physical Therapy Treatment  Patient Details  Name: Neil [name] MRN: [id number] Date of Birth: 1945-06-24 Referring Provider (PT): Reita May Date: 05/23/2019  PT End of Session - 05/23/19 1529    Visit Number  7    Number of Visits  17    Date for PT Re-Evaluation  07/18/19    PT Start Time  1400    PT Stop Time  1445    PT Time Calculation (min)  45 min    Activity Tolerance  Patient tolerated treatment well    Behavior During Therapy  [facility] for tasks assessed/performed       Past Medical History:  Diagnosis Date  . Atrial fibrillation ([place])   . Chronic anticoagulation    On Eliquis  . GERD (gastroesophageal reflux disease)   . History of radiation therapy 11/16/18- 01/03/19   Head and Neck, Base of Tongue 35 fractions of 2 Gy each for a total of 70 Gy.   Marland Kitchen Hypertension   . Joint pain     Past Surgical History:  Procedure Laterality Date  . HEMORROIDECTOMY  2004  . HERNIA REPAIR  1960  . IR GASTROSTOMY TUBE MOD SED  11/07/2018  . IR GASTROSTOMY TUBE REMOVAL  05/02/2019  . IR IMAGING GUIDED PORT INSERTION  11/07/2018  . IR PATIENT EVAL TECH 0-60 MINS  03/30/2019  . IR REMOVAL TUN ACCESS W/ PORT W/O FL MOD SED  05/02/2019  . IR [place] TUBE PERCUT W/FLUORO  01/01/2019  . IR US GUIDANCE  09/04/2018  . IR US GUIDE BX ASP/DRAIN  09/04/2018  . MOUTH BIOPSY  09/04/2018   right jaw  . right testical removed    . TONSILLECTOMY      There were no vitals filed for this visit.  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  No/denies         [facility] PT Assessment - 05/23/19 0001      Assessment   Medical Diagnosis  base of tongue cancer    Referring Provider (PT)  Isidore Moos    Onset Date/Surgical Date  08/07/18      Prior Function   Level of Independence  Independent        LYMPHEDEMA/ONCOLOGY QUESTIONNAIRE - 05/23/19 1409      Head and Neck   4 cm superior to sternal notch around neck  43 cm    6 cm superior to sternal notch around neck  44.8 cm    8 cm superior to sternal notch around neck  46.2 cm                [facility] Adult PT Treatment/Exercise - 05/23/19 0001      Manual Therapy   Manual Therapy  Edema management;Manual Lymphatic Drainage (MLD)    Edema Management  remeasured neck circumference     Manual Lymphatic Drainage (MLD)  short  PT Next Visit Plan   Monitor lymphedema and exercise program and progress as indicaged. Garment arrive?  Flexitouch in process?    PT Home Exercise Plan  head and neck ROM exercises, practice sit to stands; supine scapular strength and standing bil LE strength/balance; MedBridge code [id number]    Consulted and Agree with Plan of Care  Patient       Patient will benefit from skilled therapeutic intervention in order to improve the following deficits and impairments:  Decreased balance, Decreased endurance, Difficulty walking, Decreased knowledge of precautions, Decreased range of motion, Increased edema, Decreased activity tolerance, Decreased strength, Increased fascial restricitons, Impaired UE functional use, Postural dysfunction, Pain  Visit Diagnosis: Lymphedema, not elsewhere classified - Plan: PT plan of care cert/re-cert  Abnormal posture - Plan: PT plan of care cert/re-cert  Cervicalgia - Plan: PT plan of care cert/re-cert  Difficulty in walking, not elsewhere classified - Plan: PT plan of care cert/re-cert  Muscle weakness (generalized) - Plan: PT plan of care cert/re-cert     Problem List Patient Active Problem List   Diagnosis Date Noted  . Port-A-Cath in place 11/22/2018  . CKD (chronic kidney disease), stage III 10/18/2018  . Cancer of base of tongue ([place]) 10/16/2018  [name]. Owens Shark PT   [name] 05/23/2019, 3:39 PM  [place], [place], [address] Phone: [phone]   Fax:  [phone]  Name: Neil [name] MRN: [id number] Date of Birth: 1945/05/16

## 2019-05-28 DIAGNOSIS — R432 Parageusia: Secondary | ICD-10-CM | POA: Diagnosis not present

## 2019-05-28 DIAGNOSIS — I890 Lymphedema, not elsewhere classified: Secondary | ICD-10-CM | POA: Diagnosis not present

## 2019-05-28 DIAGNOSIS — Z923 Personal history of irradiation: Secondary | ICD-10-CM | POA: Diagnosis not present

## 2019-05-28 DIAGNOSIS — H9313 Tinnitus, bilateral: Secondary | ICD-10-CM | POA: Diagnosis not present

## 2019-05-28 DIAGNOSIS — H938X3 Other specified disorders of ear, bilateral: Secondary | ICD-10-CM | POA: Diagnosis not present

## 2019-05-28 DIAGNOSIS — Z85818 Personal history of malignant neoplasm of other sites of lip, oral cavity, and pharynx: Secondary | ICD-10-CM | POA: Diagnosis not present

## 2019-05-28 DIAGNOSIS — R682 Dry mouth, unspecified: Secondary | ICD-10-CM | POA: Diagnosis not present

## 2019-05-28 DIAGNOSIS — Z9221 Personal history of antineoplastic chemotherapy: Secondary | ICD-10-CM | POA: Diagnosis not present

## 2019-05-30 ENCOUNTER — Ambulatory Visit: Payer: Medicare Other | Admitting: Physical Therapy

## 2019-05-30 ENCOUNTER — Other Ambulatory Visit: Payer: Self-pay

## 2019-05-30 DIAGNOSIS — R293 Abnormal posture: Secondary | ICD-10-CM

## 2019-05-30 DIAGNOSIS — I890 Lymphedema, not elsewhere classified: Secondary | ICD-10-CM | POA: Diagnosis not present

## 2019-05-30 DIAGNOSIS — M542 Cervicalgia: Secondary | ICD-10-CM | POA: Diagnosis not present

## 2019-05-30 DIAGNOSIS — R262 Difficulty in walking, not elsewhere classified: Secondary | ICD-10-CM | POA: Diagnosis not present

## 2019-05-30 DIAGNOSIS — M6281 Muscle weakness (generalized): Secondary | ICD-10-CM

## 2019-05-30 NOTE — Therapy (Signed)
[place], [place], [address] Phone: [phone]   Fax:  [phone]  Physical Therapy Treatment  Patient Details  Name: Neil [name] MRN: [id number] Date of Birth: 01/28/1946 Referring Provider (PT): Reita May Date: 05/30/2019  PT End of Session - 05/30/19 1622    Visit Number  8    Number of Visits  17    Date for PT Re-Evaluation  07/18/19    PT Start Time  1400    PT Stop Time  1455    PT Time Calculation (min)  55 min    Activity Tolerance  Patient tolerated treatment well    Behavior During Therapy  [facility] for tasks assessed/performed       Past Medical History:  Diagnosis Date  . Atrial fibrillation ([place])   . Chronic anticoagulation    On Eliquis  . GERD (gastroesophageal reflux disease)   . History of radiation therapy 11/16/18- 01/03/19   Head and Neck, Base of Tongue 35 fractions of 2 Gy each for a total of 70 Gy.   Marland Kitchen Hypertension   . Joint pain     Past Surgical History:  Procedure Laterality Date  . HEMORROIDECTOMY  2004  . HERNIA REPAIR  1960  . IR GASTROSTOMY TUBE MOD SED  11/07/2018  . IR GASTROSTOMY TUBE REMOVAL  05/02/2019  . IR IMAGING GUIDED PORT INSERTION  11/07/2018  . IR PATIENT EVAL TECH 0-60 MINS  03/30/2019  . IR REMOVAL TUN ACCESS W/ PORT W/O FL MOD SED  05/02/2019  . IR [place] TUBE PERCUT W/FLUORO  01/01/2019  . IR US GUIDANCE  09/04/2018  . IR US GUIDE BX ASP/DRAIN  09/04/2018  . MOUTH BIOPSY  09/04/2018   right jaw  . right testical removed    . TONSILLECTOMY      There were no vitals filed for this visit.  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  No/denies                       [facility] Adult PT Treatment/Exercise - 05/30/19 0001      Shoulder Exercises: Supine   Protraction  AROM;Both;10 reps    Other Supine Exercises  shoulder flexion with dowel x 10 reps       Manual Therapy   Manual Therapy  Edema management;Manual Lymphatic Drainage (MLD)    Edema Management  tried Tribute night on patient but it does not seem to fit well at all. It is too big in front of the face and slides at the top of his head.  It is too uncomfortable and pt will not wear it.  He will have it checked by [facility] on Friday.  made him another chip pack that seems to work much better.     Manual Lymphatic Drainage (MLD)  short neck, supraclavicular fossa, bil shoulder collectors, 5 deep breaths, bil axillary circles. anterior neck to chest across clavicular watershed, antero-lateral neck to lateral neck, posterior-lateral neck to  [place], [place], [address] Phone: [phone]   Fax:  [phone]  Physical Therapy Treatment  Patient Details  Name: Neil [name] MRN: [id number] Date of Birth: 01/28/1946 Referring Provider (PT): Reita May Date: 05/30/2019  PT End of Session - 05/30/19 1622    Visit Number  8    Number of Visits  17    Date for PT Re-Evaluation  07/18/19    PT Start Time  1400    PT Stop Time  1455    PT Time Calculation (min)  55 min    Activity Tolerance  Patient tolerated treatment well    Behavior During Therapy  [facility] for tasks assessed/performed       Past Medical History:  Diagnosis Date  . Atrial fibrillation ([place])   . Chronic anticoagulation    On Eliquis  . GERD (gastroesophageal reflux disease)   . History of radiation therapy 11/16/18- 01/03/19   Head and Neck, Base of Tongue 35 fractions of 2 Gy each for a total of 70 Gy.   Marland Kitchen Hypertension   . Joint pain     Past Surgical History:  Procedure Laterality Date  . HEMORROIDECTOMY  2004  . HERNIA REPAIR  1960  . IR GASTROSTOMY TUBE MOD SED  11/07/2018  . IR GASTROSTOMY TUBE REMOVAL  05/02/2019  . IR IMAGING GUIDED PORT INSERTION  11/07/2018  . IR PATIENT EVAL TECH 0-60 MINS  03/30/2019  . IR REMOVAL TUN ACCESS W/ PORT W/O FL MOD SED  05/02/2019  . IR [place] TUBE PERCUT W/FLUORO  01/01/2019  . IR US GUIDANCE  09/04/2018  . IR US GUIDE BX ASP/DRAIN  09/04/2018  . MOUTH BIOPSY  09/04/2018   right jaw  . right testical removed    . TONSILLECTOMY      There were no vitals filed for this visit.  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  No/denies                       [facility] Adult PT Treatment/Exercise - 05/30/19 0001      Shoulder Exercises: Supine   Protraction  AROM;Both;10 reps    Other Supine Exercises  shoulder flexion with dowel x 10 reps       Manual Therapy   Manual Therapy  Edema management;Manual Lymphatic Drainage (MLD)    Edema Management  tried Tribute night on patient but it does not seem to fit well at all. It is too big in front of the face and slides at the top of his head.  It is too uncomfortable and pt will not wear it.  He will have it checked by [facility] on Friday.  made him another chip pack that seems to work much better.     Manual Lymphatic Drainage (MLD)  short neck, supraclavicular fossa, bil shoulder collectors, 5 deep breaths, bil axillary circles. anterior neck to chest across clavicular watershed, antero-lateral neck to lateral neck, posterior-lateral neck to  CKD (chronic kidney disease), stage III 10/18/2018  . Cancer of base of tongue ([place]) 10/16/2018   [name]. Owens Shark PT  [name] 05/30/2019, 4:26 PM  [place], [place], [address] Phone: [phone]   Fax:  [phone]  Name: Neil [name] MRN: [id number] Date of Birth: 1946/01/20

## 2019-06-06 ENCOUNTER — Encounter: Payer: Self-pay | Admitting: Physical Therapy

## 2019-06-06 ENCOUNTER — Ambulatory Visit: Payer: Medicare Other | Admitting: Physical Therapy

## 2019-06-06 ENCOUNTER — Other Ambulatory Visit: Payer: Self-pay

## 2019-06-06 DIAGNOSIS — R293 Abnormal posture: Secondary | ICD-10-CM | POA: Diagnosis not present

## 2019-06-06 DIAGNOSIS — I890 Lymphedema, not elsewhere classified: Secondary | ICD-10-CM

## 2019-06-06 DIAGNOSIS — M6281 Muscle weakness (generalized): Secondary | ICD-10-CM

## 2019-06-06 DIAGNOSIS — M542 Cervicalgia: Secondary | ICD-10-CM

## 2019-06-06 DIAGNOSIS — R262 Difficulty in walking, not elsewhere classified: Secondary | ICD-10-CM | POA: Diagnosis not present

## 2019-06-06 NOTE — Therapy (Signed)
Decreased strength, Increased fascial restricitons, Impaired UE functional use, Postural dysfunction, Pain  Visit Diagnosis: Lymphedema, not elsewhere classified  Abnormal posture  Cervicalgia  Muscle weakness (generalized)  Difficulty in walking, not elsewhere classified     Problem List Patient Active Problem List   Diagnosis Date Noted  . Port-A-Cath in place 11/22/2018  . CKD (chronic kidney disease), stage III 10/18/2018  . Cancer of base of tongue ([place]) 10/16/2018   Neil Heinz. Neil [name] PT  Neil [name] 06/06/2019, 4:29 PM  [place], [place], [address] Phone: [phone]   Fax:  [phone]  Name: Neil [name] MRN: [id number] Date of Birth: [birth year]  [place], [place], [address] Phone: [phone]   Fax:  [phone]  Physical Therapy Treatment  Patient Details  Name: Neil [name] MRN: [id number] Date of Birth: [birth year] Referring Provider (PT): Neil [name] Date: 06/06/2019  PT End of Session - 06/06/19 1626    Visit Number  9    Number of Visits  17    Date for PT Re-Evaluation  07/18/19    PT Start Time  1300    PT Stop Time  1355    PT Time Calculation (min)  55 min    Activity Tolerance  Patient tolerated treatment well    Behavior During Therapy  [facility] for tasks assessed/performed       Past Medical History:  Diagnosis Date  . Atrial fibrillation ([place])   . Chronic anticoagulation    On Eliquis  . GERD (gastroesophageal reflux disease)   . History of radiation therapy 11/16/18- 01/03/19   Head and Neck, Base of Tongue 35 fractions of 2 Gy each for a total of 70 Gy.   Marland Kitchen Hypertension   . Joint pain     Past Surgical History:  Procedure Laterality Date  . HEMORROIDECTOMY  2004  . HERNIA REPAIR  1960  . IR GASTROSTOMY TUBE MOD SED  11/07/2018  . IR GASTROSTOMY TUBE REMOVAL  05/02/2019  . IR IMAGING GUIDED PORT INSERTION  11/07/2018  . IR PATIENT EVAL TECH 0-60 MINS  03/30/2019  . IR REMOVAL TUN ACCESS W/ PORT W/O FL MOD SED  05/02/2019  . IR [place] TUBE PERCUT W/FLUORO  01/01/2019  . IR US GUIDANCE  09/04/2018  . IR US GUIDE BX ASP/DRAIN  09/04/2018  . MOUTH BIOPSY  09/04/2018   right jaw  . right testical removed    . TONSILLECTOMY      There were no vitals filed for this visit.  Subjective Assessment - 06/06/19 1308    Subjective  Pt states he is getting stronger, He found that he is able to get down on the floor and get back up. He reports that he is doing alot of exercises and feels like he is doing well.  He is in process of doing the paperwork for a dontated Flexitouch and is getting his Neil [name] remade .    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  No/denies   pain reports his pain is much better today                      [facility] Adult PT Treatment/Exercise - 06/06/19 0001      Balance Poses: Yoga   Warrior I  2 reps;15 seconds   each side      Knee/Hip Exercises: Standing   Other Standing Knee Exercises  hip hinges against the wall x 2 sets of 10 with cues to keep back straight  glute set at the top     Other Standing Knee Exercises  Farmer's carry with 8 # in each hand walking about 100 feet       Shoulder Exercises: Standing   Protraction  AROM;Right;Left;10 reps    Protraction Limitations  forearms on foam roller up the wall  with cues to keep shoulders wide  Decreased strength, Increased fascial restricitons, Impaired UE functional use, Postural dysfunction, Pain  Visit Diagnosis: Lymphedema, not elsewhere classified  Abnormal posture  Cervicalgia  Muscle weakness (generalized)  Difficulty in walking, not elsewhere classified     Problem List Patient Active Problem List   Diagnosis Date Noted  . Port-A-Cath in place 11/22/2018  . CKD (chronic kidney disease), stage III 10/18/2018  . Cancer of base of tongue ([place]) 10/16/2018   Neil Heinz. Neil [name] PT  Neil [name] 06/06/2019, 4:29 PM  [place], [place], [address] Phone: [phone]   Fax:  [phone]  Name: Neil [name] MRN: [id number] Date of Birth: [birth year]

## 2019-06-06 NOTE — Patient Instructions (Signed)
Warrior I    In wide stride, rotate back leg out 20, grounding foot, hands in prayer position in front of chest. Bend front leg 90. Reaching over head, look up. Hold for ____ breaths. Repeat, other leg forward. BEGINNER: Support body with hands on hips.   Copyright  VHI. All rights reserved.

## 2019-06-08 DIAGNOSIS — H903 Sensorineural hearing loss, bilateral: Secondary | ICD-10-CM | POA: Diagnosis not present

## 2019-06-08 DIAGNOSIS — H9313 Tinnitus, bilateral: Secondary | ICD-10-CM | POA: Diagnosis not present

## 2019-06-20 ENCOUNTER — Ambulatory Visit: Payer: Medicare Other

## 2019-06-20 ENCOUNTER — Ambulatory Visit: Payer: Medicare Other | Admitting: Physical Therapy

## 2019-06-27 ENCOUNTER — Encounter: Payer: Self-pay | Admitting: Physical Therapy

## 2019-06-27 ENCOUNTER — Ambulatory Visit: Payer: Medicare Other | Attending: Radiation Oncology | Admitting: Physical Therapy

## 2019-06-27 ENCOUNTER — Other Ambulatory Visit: Payer: Self-pay

## 2019-06-27 DIAGNOSIS — M6281 Muscle weakness (generalized): Secondary | ICD-10-CM | POA: Diagnosis not present

## 2019-06-27 DIAGNOSIS — R293 Abnormal posture: Secondary | ICD-10-CM

## 2019-06-27 DIAGNOSIS — I890 Lymphedema, not elsewhere classified: Secondary | ICD-10-CM | POA: Diagnosis not present

## 2019-06-27 NOTE — Therapy (Signed)
Visit Diagnosis: Lymphedema, not elsewhere classified  Abnormal posture  Muscle weakness (generalized)     Problem List Patient Active Problem List   Diagnosis Date Noted  . Port-A-Cath in place 11/22/2018  . CKD (chronic kidney disease), stage III 10/18/2018  . Cancer of base of tongue ([place]) 10/16/2018   Neil [name]. Owens Neil [name]  [name] 06/27/2019, 4:27 PM  [name], [place], [address] Phone: [phone]   Fax:  [phone]  Name: Neil [name] MRN: [id number] Date of Birth: [birth year]  Visit Diagnosis: Lymphedema, not elsewhere classified  Abnormal posture  Muscle weakness (generalized)     Problem List Patient Active Problem List   Diagnosis Date Noted  . Port-A-Cath in place 11/22/2018  . CKD (chronic kidney disease), stage III 10/18/2018  . Cancer of base of tongue ([place]) 10/16/2018   Neil [name]. [place] Neil [name]  [name] 06/27/2019, 4:27 PM  [name], [place], [address] Phone: [phone]   Fax:  [phone]  Name: Neil [name] MRN: [id number] Date of Birth: [birth year]  [place], [place], [address] Phone: [phone]   Fax:  [phone]  Physical Therapy Treatment  Patient Details  Name: Neil [name] MRN: [id number] Date of Birth: 1945/07/31 Referring Provider ([name]): Isidore Moos  Progress Note Reporting Period 04/19/2019 to 06/27/2019  See note below for Objective Data and Assessment of Progress/Goals.      Encounter Date: 06/27/2019  [name] End of Session - 06/27/19 1621    Visit Number  10    Number of Visits  17    Date for [name] Re-Evaluation  07/18/19    [name] Start Time  1400    [name] Stop Time  1445    [name] Time Calculation (min)  45 min    Activity Tolerance  Patient tolerated treatment well    Behavior During Therapy  [facility] for tasks assessed/performed       Past Medical History:  Diagnosis Date  . Atrial fibrillation ([place])   . Chronic anticoagulation    On Eliquis  . GERD (gastroesophageal reflux disease)   . History of radiation therapy 11/16/18- 01/03/19   Head and Neck, Base of Tongue 35 fractions of 2 Gy each for a total of 70 Gy.   Neil [name] Hypertension   . Joint pain     Past Surgical History:  Procedure Laterality Date  . HEMORROIDECTOMY  2004  . HERNIA REPAIR  1960  . IR GASTROSTOMY TUBE MOD SED  11/07/2018  . IR GASTROSTOMY TUBE REMOVAL  05/02/2019  . IR IMAGING GUIDED PORT INSERTION  11/07/2018  . IR PATIENT EVAL TECH 0-60 MINS  03/30/2019  . IR REMOVAL TUN ACCESS W/ PORT W/O FL MOD SED  05/02/2019  . IR [place] TUBE PERCUT W/FLUORO  01/01/2019  . IR US GUIDANCE  09/04/2018  . IR US GUIDE BX ASP/DRAIN  09/04/2018  . MOUTH BIOPSY  09/04/2018   right jaw  . right testical removed    . TONSILLECTOMY      There were no vitals filed for this visit.  Subjective Assessment - 06/27/19 1405    Subjective  [name] comes in with refit Tribute .It seems to fit well but is too hot to sleep in.  He says his swelling varies from day to day.  He feels that he is able to  eat more but he has to remebmer to take small bites. He is trying to do his own manual lymph drainage.    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  Yes   he had pain this morning   Pain Score  1     Pain Location  Throat    Pain Descriptors / Indicators  Discomfort                       [facility] Adult [name] Treatment/Exercise - 06/27/19 0001      Manual Therapy   Manual Therapy  Edema management;Manual Lymphatic Drainage (MLD)    Edema Management  [name] brought Tribute in and it fits much better.  [name] is apply to don and doff it , adjust it independently     Manual Lymphatic Drainage (MLD)  short neck, supraclavicular fossa, bil shoulder collectors, 5 deep breaths, bil axillary circles. anterior neck to

## 2019-07-04 ENCOUNTER — Ambulatory Visit: Payer: Medicare Other | Admitting: Physical Therapy

## 2019-07-04 ENCOUNTER — Other Ambulatory Visit: Payer: Self-pay

## 2019-07-04 DIAGNOSIS — I890 Lymphedema, not elsewhere classified: Secondary | ICD-10-CM | POA: Diagnosis not present

## 2019-07-04 DIAGNOSIS — M6281 Muscle weakness (generalized): Secondary | ICD-10-CM | POA: Diagnosis not present

## 2019-07-04 DIAGNOSIS — R293 Abnormal posture: Secondary | ICD-10-CM

## 2019-07-04 NOTE — Therapy (Signed)
[facility] [address] [place], [place], [address] Phone: [phone]   Fax:  [phone]  Physical Therapy Treatment  Patient Details  Name: Neil [name] MRN: [id number] Date of Birth: 03-Oct-1945 Referring Provider (PT): Vertis Kelch Date: 07/04/2019  PT End of Session - 07/04/19 1446    Visit Number  11    Number of Visits  17    Date for PT Re-Evaluation  07/18/19    PT Start Time  1400    PT Stop Time  1445    PT Time Calculation (min)  45 min    Activity Tolerance  Patient tolerated treatment well    Behavior During Therapy  [facility] for tasks assessed/performed       Past Medical History:  Diagnosis Date  . Atrial fibrillation ([facility])   . Chronic anticoagulation    On Eliquis  . GERD (gastroesophageal reflux disease)   . History of radiation therapy 11/16/18- 01/03/19   Head and Neck, Base of Tongue 35 fractions of 2 Gy each for a total of 70 Gy.   Marland Kitchen Hypertension   . Joint pain     Past Surgical History:  Procedure Laterality Date  . HEMORROIDECTOMY  2004  . HERNIA REPAIR  1960  . IR GASTROSTOMY TUBE MOD SED  11/07/2018  . IR GASTROSTOMY TUBE REMOVAL  05/02/2019  . IR IMAGING GUIDED PORT INSERTION  11/07/2018  . IR PATIENT EVAL TECH 0-60 MINS  03/30/2019  . IR REMOVAL TUN ACCESS W/ PORT W/O FL MOD SED  05/02/2019  . IR REPLC GASTRO/COLONIC TUBE PERCUT W/FLUORO  01/01/2019  . IR US GUIDANCE  09/04/2018  . IR US GUIDE BX ASP/DRAIN  09/04/2018  . MOUTH BIOPSY  09/04/2018   right jaw  . right testical removed    . TONSILLECTOMY      There were no vitals filed for this visit.  Subjective Assessment - 07/04/19 1400    Subjective  Pt says he went to the [place] in [place] yestereday and had a good experience.  He thinks that he will get approval for the Flexitouch though the [place] . It shoulde be here in a week and someone will come out and show him how to use it.    Pertinent History  08/2018 diagnosed with Stage I  base of tongue cancer, 11/2018-12/2018 completed chemoradiation, A-fib    Patient Stated Goals  to be able to get out and do something, get swelling down, be able to move and get around                       [facility] Adult PT Treatment/Exercise - 07/04/19 0001      Manual Therapy   Manual Therapy  Edema management;Manual Lymphatic Drainage (MLD)    Manual Lymphatic Drainage (MLD)  short neck, supraclavicular fossa, bil shoulder collectors, 5 deep breaths, bil axillary circles. anterior neck to chest across clavicular watershed, antero-lateral neck to lateral neck, posterior-lateral neck to lateral neck , cheeks , around ears and more work on anterior neck.                   PT Long Term Goals - 06/27/19 1625      PT LONG TERM GOAL #1   Title  Pt will be able to complete 13 reps of sit to stands in 30 seconds to improve functional mobility.    Baseline  9; 11 reps - 05/16/19    Time  8    Period  Weeks    Status  On-going      PT LONG TERM GOAL #2   Title  Pt will demonstrate 45 degrees of bilateral lateral cervical flexion to allow pt to return to PLOF.    Baseline  R 25, L 39; Rt 49 and Lt 52 - 05/16/19    Status  Achieved      PT LONG TERM GOAL #3   Title  Pt will receive appropriate compression garments for long term management of neck lymphedema.    Status  Achieved      PT LONG TERM GOAL #4   Title  Pt will be independent with self MLD for long term management of neck lymphedema.    Baseline  Pt is independent with self manual lymph drainage and compression garment is on order, also wears chip pack daily-05/16/19    Status  Achieved      PT LONG TERM GOAL #5   Title  Pt will be independent in a home exercise program for continued strengthening and stetching.    Status  Achieved            Plan - 07/04/19 1447    Clinical Impression Statement  Pt is doing much better this week.  He got approval through the [place] for his Flexitouch and is should come  next week.    Personal Factors and Comorbidities  Age;Comorbidity 1    Comorbidities  a-fib    Examination-Activity Limitations  Carry;Reach Overhead;Lift;Squat    Examination-Participation Restrictions  Community Activity;Yard Work;Cleaning;Shop    Stability/Clinical Decision Making  Stable/Uncomplicated    Rehab Potential  Good    PT Frequency  1x / week    PT Duration  8 weeks    PT Treatment/Interventions  ADLs/Self Care Home Management;Therapeutic activities;Therapeutic exercise;Neuromuscular re-education;Balance training;Manual techniques;Manual lymph drainage;Compression bandaging;Taping;Passive range of motion;Vasopneumatic Device;Joint Manipulations    PT Next Visit Plan  Reassess as pt gets Flexitouch Monitor lymphedema and exercise program and progress as indicated    PT Home Exercise Plan  head and neck ROM exercises, practice sit to stands; supine scapular strength and standing bil LE strength/balance; MedBridge code [id number]    Consulted and Agree with Plan of Care  Patient       Patient will benefit from skilled therapeutic intervention in order to improve the following deficits and impairments:  Decreased balance, Decreased endurance, Difficulty walking, Decreased knowledge of precautions, Decreased range of motion, Increased edema, Decreased activity tolerance, Decreased strength, Increased fascial restricitons, Impaired UE functional use, Postural dysfunction, Pain  Visit Diagnosis: Lymphedema, not elsewhere classified  Abnormal posture  Muscle weakness (generalized)     Problem List Patient Active Problem List   Diagnosis Date Noted  . Port-A-Cath in place 11/22/2018  . CKD (chronic kidney disease), stage III 10/18/2018  . Cancer of base of tongue ([facility]) 10/16/2018   Bayard Hugger. Manson Passey PT  Donnetta Hail 07/04/2019, 2:51 PM  [facility] [address] [place], [place], [address] Phone: [phone]    Fax:  [phone]  Name: Neil [name] MRN: [id number] Date of Birth: 06/01/1945

## 2019-07-09 ENCOUNTER — Ambulatory Visit: Payer: Medicare Other | Attending: Radiation Oncology

## 2019-07-09 ENCOUNTER — Other Ambulatory Visit: Payer: Self-pay

## 2019-07-09 DIAGNOSIS — R293 Abnormal posture: Secondary | ICD-10-CM | POA: Insufficient documentation

## 2019-07-09 DIAGNOSIS — R1310 Dysphagia, unspecified: Secondary | ICD-10-CM | POA: Diagnosis not present

## 2019-07-09 DIAGNOSIS — I890 Lymphedema, not elsewhere classified: Secondary | ICD-10-CM | POA: Diagnosis not present

## 2019-07-09 DIAGNOSIS — M6281 Muscle weakness (generalized): Secondary | ICD-10-CM | POA: Insufficient documentation

## 2019-07-09 DIAGNOSIS — R262 Difficulty in walking, not elsewhere classified: Secondary | ICD-10-CM | POA: Insufficient documentation

## 2019-07-09 NOTE — Therapy (Signed)
[facility] [address] [place], [place], [address] Phone: [phone]   Fax:  [phone]  Speech Language Pathology Treatment/ Discharge-renewal   Patient Details  Name: Neil [name] MRN: [id number] Date of Birth: 07-02-1945 Referring Provider (SLP): Lonie Peak, MD   Encounter Date: 07/09/2019  End of Session - 07/09/19 1139    Visit Number  7    Number of Visits  7    Date for SLP Re-Evaluation  07/09/19    SLP Start Time  1103    SLP Stop Time   1135    SLP Time Calculation (min)  32 min    Activity Tolerance  Patient tolerated treatment well       Past Medical History:  Diagnosis Date  . Atrial fibrillation (H[facility])   . Chronic anticoagulation    On Eliquis  . GERD (gastroesophageal reflux disease)   . History of radiation therapy 11/16/18- 01/03/19   Head and Neck, Base of Tongue 35 fractions of 2 Gy each for a total of 70 Gy.   Marland Kitchen Hypertension   . Joint pain     Past Surgical History:  Procedure Laterality Date  . HEMORROIDECTOMY  2004  . HERNIA REPAIR  1960  . IR GASTROSTOMY TUBE MOD SED  11/07/2018  . IR GASTROSTOMY TUBE REMOVAL  05/02/2019  . IR IMAGING GUIDED PORT INSERTION  11/07/2018  . IR PATIENT EVAL TECH 0-60 MINS  03/30/2019  . IR REMOVAL TUN ACCESS W/ PORT W/O FL MOD SED  05/02/2019  . IR REPLC GASTRO/COLONIC TUBE PERCUT W/FLUORO  01/01/2019  . IR US GUIDANCE  09/04/2018  . IR US GUIDE BX ASP/DRAIN  09/04/2018  . MOUTH BIOPSY  09/04/2018   right jaw  . right testical removed    . TONSILLECTOMY      There were no vitals filed for this visit.  Subjective Assessment - 07/09/19 1109    Subjective  "I've had a revelation  - a good one."    Currently in Pain?  Yes    Pain Location  Throat    Pain Orientation  Right;Mid    Pain Descriptors / Indicators  Discomfort            ADULT SLP TREATMENT - 07/09/19 1109      General Information   Behavior/Cognition  Alert;Cooperative;Pleasant  mood      Treatment Provided   Treatment provided  Dysphagia      Dysphagia Treatment   Temperature Spikes Noted  No    Respiratory Status  Room air    Oral Cavity - Dentition  Edentulous    Treatment Methods  Therapeutic exercise;Patient/caregiver education;Skilled observation    Patient observed directly with PO's  Yes    Type of PO's observed  Thin liquids;Dysphagia 3 (soft)    Oral Phase Signs & Symptoms  Other (comment)   none noted   Pharyngeal Phase Signs & Symptoms  Other (comment)   none noted/reported   Other treatment/comments  Pt eats salad, cereal with milk, "wraps" with lettuce or tortillas. Pt reports it takes him longer to eat due to slower pace and smaller bite size and he is "OK with that." Pt doing chin tuck against resistance (chin-fist resist) - pt reports some regular diet foods but he suffers from [place]. Pt demonstrated a satisfactory alterntive for the high "heee" exercise. Pt performed each exercise with good success. SLP told       Assessment / Recommendations / Plan   Plan  Discharge SLP treatment due to (comment)      Dysphagia Recommendations   Diet recommendations  --   as tolerated   Liquids provided via  Cup    Medication Administration  --   as tolerated     Progression Toward Goals   Progression toward goals  Goals met, education completed, patient discharged from SLP         SLP Short Term Goals - 11/27/18 1422      SLP SHORT TERM GOAL #1   Title  Pt will demo understanding of proper procedure for HEP with rare min A    Period  --   session   Status  Partially Met      SLP SHORT TERM GOAL #2   Title  pt will tell SLP why he is completing HEP    Period  --   session   Status  Partially Met       SLP Long Term Goals - 07/09/19 1126      SLP LONG TERM GOAL #1   Title  pt will demo understanding of proper HEP procedure with modified indpendence x2 sessions    Baseline  12-25-18, 04-20-19    Status  Achieved      SLP LONG TERM  GOAL #2   Title  pt will tell SLP 3 overt s/sx aspiration PNA with modified independence    Status  Achieved      SLP LONG TERM GOAL #3   Title  pt will tell the benefit of a food journal for return to pre-chemorad diet    Status  Achieved      SLP LONG TERM GOAL #4   Title  pt will tell when to decr frequency of HEP from daily to x2-3/week x2 sessions    Baseline  04-20-19    Status  Achieved       Plan - 07/09/19 1251    Clinical Impression Statement  Pt presents today with WFL/WNL swallowing for liquids, WFL for solids (dys III). See "other comments" for details. He told ZSNo overt s/s aspiration pNA today, none reported. Data show that probability of dysphagia increases during chemorad tx and afterwards, and is mitigated by HEP focusing on swallowing musculature. Pt would benefit from follow up skilled ST to assess pt's safety with POs and also to assess pt's ability to correctly perform HEP. Visits will be performed virtually (telephone or [employer]) or in - person.    Treatment/Interventions  Aspiration precaution training;Pharyngeal strengthening exercises;Diet toleration management by SLP;Internal/external aids;Patient/family education;SLP instruction and feedback;Trials of upgraded texture/liquids;Environmental controls    Potential to Achieve Goals  Good    SLP Home Exercise Plan  provided today       Patient will benefit from skilled therapeutic intervention in order to improve the following deficits and impairments:   Dysphagia, unspecified type   SPEECH THERAPY DISCHARGE SUMMARY  Visits from Start of Care: 7   Current functional level related to goals / functional outcomes: Pt met all LTGs - is eating many dys III and regular food items now without overt s/sx aspiration PNA. Pt goals stated above will be enforced today, for today's session.   Remaining deficits: "Slower" eating, however pt reports he does not mind this.    Education / Equipment: HEP procedure, late  effects head/neck radiation has on swallow function  Plan: Patient agrees to discharge.  Patient goals were met. Patient is being discharged due to meeting the stated rehab goals.  ?????  Problem List Patient Active Problem List   Diagnosis Date Noted  . Port-A-Cath in place 11/22/2018  . CKD (chronic kidney disease), stage III 10/18/2018  . Cancer of base of tongue (HCC) 10/16/2018    [facility] ,MS, CCC-SLP  07/09/2019, 12:52 PM  [facility] [address] [place], [place], [address] Phone: [phone]   Fax:  [phone]   Name: Neil [name] MRN: [id number] Date of Birth: [birth year]

## 2019-07-11 ENCOUNTER — Ambulatory Visit: Payer: Medicare Other | Admitting: Physical Therapy

## 2019-07-11 ENCOUNTER — Other Ambulatory Visit: Payer: Self-pay

## 2019-07-11 DIAGNOSIS — R1310 Dysphagia, unspecified: Secondary | ICD-10-CM | POA: Diagnosis not present

## 2019-07-11 DIAGNOSIS — M6281 Muscle weakness (generalized): Secondary | ICD-10-CM

## 2019-07-11 DIAGNOSIS — R293 Abnormal posture: Secondary | ICD-10-CM | POA: Diagnosis not present

## 2019-07-11 DIAGNOSIS — I890 Lymphedema, not elsewhere classified: Secondary | ICD-10-CM

## 2019-07-11 DIAGNOSIS — R262 Difficulty in walking, not elsewhere classified: Secondary | ICD-10-CM | POA: Diagnosis not present

## 2019-07-11 NOTE — Therapy (Signed)
05/16/19    Time  8    Period  Weeks    Status  On-going      PT LONG TERM GOAL #2   Title  Pt will demonstrate 45 degrees of bilateral lateral cervical flexion to allow pt to return to PLOF.    Baseline  R 25, L 39; Rt 49 and Lt 52 - 05/16/19    Status  Achieved      PT LONG TERM GOAL #3   Title  Pt will receive appropriate compression garments for long term management of neck lymphedema.    Status  Achieved      PT LONG TERM GOAL #4   Title  Pt will be independent with self MLD for long term management of neck lymphedema.    Baseline  Pt is independent with self manual lymph drainage and compression garment is on order, also wears chip pack daily-05/16/19    Status  Achieved      PT LONG TERM GOAL #5   Title  Pt will be independent in a home exercise program for continued strengthening and stetching.    Status  Achieved            Plan - 07/11/19 1620    Clinical Impression Statement  Pt continues to improve.  He has not received his Flexitouch yet and  contacted the sales rep who will follow up with tracking it as it should have been delivered. Pt will have one more visit after Flexitouch is delivered and instructed in use to make sure he is able to manage his lymphedma at home    Personal Factors and Comorbidities  Age;Comorbidity 1    Examination-Activity Limitations  Carry;Reach Overhead;Lift;Squat    Stability/Clinical Decision Making  Stable/Uncomplicated    Rehab Potential  Good    PT Frequency  1x / week    PT Duration  8 weeks    PT Treatment/Interventions  ADLs/Self Care Home Management;Therapeutic activities;Therapeutic exercise;Neuromuscular re-education;Balance training;Manual techniques;Manual lymph drainage;Compression bandaging;Taping;Passive range of motion;Vasopneumatic Device;Joint Manipulations    PT Next Visit Plan  Reassess as pt gets Flexitouch Monitor lymphedema and exercise program and progress as indicated ? discharge??    Consulted and Agree with Plan of Care  Patient       Patient will benefit from skilled therapeutic intervention in order to improve the following deficits and impairments:  Decreased balance, Decreased endurance, Difficulty walking, Decreased knowledge of precautions, Decreased range of motion, Increased edema, Decreased activity tolerance, Decreased strength, Increased fascial restricitons, Impaired UE functional use, Postural dysfunction, Pain  Visit Diagnosis: Lymphedema, not elsewhere classified  Abnormal posture  Muscle weakness (generalized)     Problem List Patient Active Problem List   Diagnosis Date Noted  . Port-A-Cath in place 11/22/2018  . CKD (chronic kidney disease), stage III 10/18/2018  . Cancer of base of tongue ([place]) 10/16/2018   [name]. Owens Shark PT  [name] 07/11/2019, 4:22 PM  [place], [place], [address] Phone: [phone]   Fax:  [phone]  Name: Kellee Kneece MRN:  [id number] Date of Birth: 1945-08-31  [place], [place], [address] Phone: [phone]   Fax:  [phone]  Physical Therapy Treatment  Patient Details  Name: Roberta Farleigh MRN: [id number] Date of Birth: 1945/06/03 Referring Provider (PT): Reita May Date: 07/11/2019  PT End of Session - 07/11/19 1619    Visit Number  12    Number of Visits  17    Date for PT Re-Evaluation  07/18/19    PT Start Time  1400    PT Stop Time  1445    PT Time Calculation (min)  45 min    Activity Tolerance  Patient tolerated treatment well    Behavior During Therapy  [facility] for tasks assessed/performed       Past Medical History:  Diagnosis Date  . Atrial fibrillation ([place])   . Chronic anticoagulation    On Eliquis  . GERD (gastroesophageal reflux disease)   . History of radiation therapy 11/16/18- 01/03/19   Head and Neck, Base of Tongue 35 fractions of 2 Gy each for a total of 70 Gy.   Marland Kitchen Hypertension   . Joint pain     Past Surgical History:  Procedure Laterality Date  . HEMORROIDECTOMY  2004  . HERNIA REPAIR  1960  . IR GASTROSTOMY TUBE MOD SED  11/07/2018  . IR GASTROSTOMY TUBE REMOVAL  05/02/2019  . IR IMAGING GUIDED PORT INSERTION  11/07/2018  . IR PATIENT EVAL TECH 0-60 MINS  03/30/2019  . IR REMOVAL TUN ACCESS W/ PORT W/O FL MOD SED  05/02/2019  . IR [place] TUBE PERCUT W/FLUORO  01/01/2019  . IR US GUIDANCE  09/04/2018  . IR US GUIDE BX ASP/DRAIN  09/04/2018  . MOUTH BIOPSY  09/04/2018   right jaw  . right testical removed    . TONSILLECTOMY      There were no vitals filed for this visit.  Subjective Assessment - 07/11/19 1404    Subjective  Pt says he got discharged by speech therapy.  He is swallowing fine.  He has not received his Flexitouch yet.  He feels that his throat swelling feels pretty good.  He is wearing his Lelan Pons every day but is not able to sleep in it.    Pertinent History  08/2018 diagnosed with Stage I base  of tongue cancer, 11/2018-12/2018 completed chemoradiation, A-fib    Patient Stated Goals  to be able to get out and do something, get swelling down, be able to move and get around    Currently in Pain?  No/denies                       [facility] Adult PT Treatment/Exercise - 07/11/19 0001      Manual Therapy   Manual Therapy  Edema management;Manual Lymphatic Drainage (MLD)    Manual Lymphatic Drainage (MLD)  short neck, supraclavicular fossa, bil shoulder collectors, 5 deep breaths, bil axillary circles. anterior neck to chest across clavicular watershed, antero-lateral neck to lateral neck, posterior-lateral neck to lateral neck , cheeks , around ears and more work on anterior neck.                   PT Long Term Goals - 06/27/19 1625      PT LONG TERM GOAL #1   Title  Pt will be able to complete 13 reps of sit to stands in 30 seconds to improve functional mobility.    Baseline  9; 11 reps -

## 2019-07-18 ENCOUNTER — Encounter: Payer: Medicare Other | Admitting: Physical Therapy

## 2019-08-01 ENCOUNTER — Encounter: Payer: Self-pay | Admitting: Physical Therapy

## 2019-08-01 ENCOUNTER — Ambulatory Visit: Payer: Medicare Other | Admitting: Physical Therapy

## 2019-08-01 ENCOUNTER — Other Ambulatory Visit: Payer: Self-pay

## 2019-08-01 DIAGNOSIS — I890 Lymphedema, not elsewhere classified: Secondary | ICD-10-CM | POA: Diagnosis not present

## 2019-08-01 DIAGNOSIS — M6281 Muscle weakness (generalized): Secondary | ICD-10-CM

## 2019-08-01 DIAGNOSIS — R293 Abnormal posture: Secondary | ICD-10-CM | POA: Diagnosis not present

## 2019-08-01 DIAGNOSIS — R1310 Dysphagia, unspecified: Secondary | ICD-10-CM | POA: Diagnosis not present

## 2019-08-01 DIAGNOSIS — R262 Difficulty in walking, not elsewhere classified: Secondary | ICD-10-CM

## 2019-08-01 NOTE — Therapy (Signed)
Diagnosis Date Noted  . Port-A-Cath in place 11/22/2018  . CKD (chronic kidney disease), stage III 10/18/2018  . Cancer of base of tongue ([place]) 10/16/2018    PHYSICAL THERAPY DISCHARGE SUMMARY  Visits from Start of Care: 12  Current functional level related to goals / functional outcomes: Pt is independent in managing his lymphedema at home    Remaining deficits: Intermittent neck pain    Education / Equipment: Self MLD, use of compression, home exercise  Plan: Patient agrees to discharge.  Patient goals were met. Patient is being discharged due to meeting the stated rehab goals.  ?????     [name]. Owens Shark PT  [name] 08/01/2019, 2:56 PM  [place], [place], [address] Phone: [phone]   Fax:  [phone]  Name: Neil [name] MRN: [id number] Date of Birth: August 18, 1945  cm                 [facility] Adult PT Treatment/Exercise - 08/01/19 0001      Manual Therapy   Manual Therapy  Edema management;Manual Lymphatic Drainage (MLD)    Manual therapy comments  gave pt information about trails to recovery, [employer] center and phone number to call for [facility]    Edema Management  remeasured neck     Manual Lymphatic Drainage (MLD)  short neck, supraclavicular fossa, bil shoulder collectors, 5 deep breaths, bil axillary circles. anterior neck to chest across clavicular watershed, antero-lateral neck to lateral neck, posterior-lateral neck to lateral neck , cheeks , around ears and more work on anterior neck.                   PT Long Term Goals - 08/01/19 1414      PT LONG TERM GOAL #1   Title  Pt will be able to complete 13 reps of sit to stands in 30 seconds to improve functional mobility.    Baseline  9; 11 reps - 05/16/19, 16 reps on 07/31/2017    Status  Achieved      PT LONG TERM GOAL #2   Title  Pt will demonstrate 45 degrees of bilateral lateral cervical flexion to allow pt to  return to PLOF.    Status  Achieved      PT LONG TERM GOAL #3   Title  Pt will receive appropriate compression garments for long term management of neck lymphedema.    Status  Achieved      PT LONG TERM GOAL #4   Title  Pt will be independent with self MLD for long term management of neck lymphedema.    Status  Achieved      PT LONG TERM GOAL #5   Title  Pt will be independent in a home exercise program for continued strengthening and stetching.    Status  Achieved            Plan - 08/01/19 1454    Clinical Impression Statement  Pt is doing extremely well. He is managing his lymphedema with his comrpession garment, self MLD and Flexitouch.  He is increasing daily activities and feels that he has more energy and strength.  He has met all goals and is ready for discharge    Personal Factors and Comorbidities  Age;Comorbidity 1    Comorbidities  a-fib    Examination-Activity Limitations  Carry;Reach Overhead;Lift;Squat    Stability/Clinical Decision Making  Stable/Uncomplicated    Rehab Potential  Good    PT Frequency  1x / week    PT Duration  8 weeks    PT Next Visit Plan  discharge this episode    PT Home Exercise Plan  head and neck ROM exercises, practice sit to stands; supine scapular strength and standing bil LE strength/balance; MedBridge code [id number]    Consulted and Agree with Plan of Care  Patient       Patient will benefit from skilled therapeutic intervention in order to improve the following deficits and impairments:  Decreased balance, Decreased endurance, Difficulty walking, Decreased knowledge of precautions, Decreased range of motion, Increased edema, Decreased activity tolerance, Decreased strength, Increased fascial restricitons, Impaired UE functional use, Postural dysfunction, Pain  Visit Diagnosis: Lymphedema, not elsewhere classified  Abnormal posture  Muscle weakness (generalized)  Difficulty in walking, not elsewhere classified     Problem  List Patient Active Problem List  Diagnosis Date Noted  . Port-A-Cath in place 11/22/2018  . CKD (chronic kidney disease), stage III 10/18/2018  . Cancer of base of tongue ([facility]) 10/16/2018    PHYSICAL THERAPY DISCHARGE SUMMARY  Visits from Start of Care: 12  Current functional level related to goals / functional outcomes: Pt is independent in managing his lymphedema at home    Remaining deficits: Intermittent neck pain    Education / Equipment: Self MLD, use of compression, home exercise  Plan: Patient agrees to discharge.  Patient goals were met. Patient is being discharged due to meeting the stated rehab goals.  ?????     Bayard Hugger. Manson Passey PT  Donnetta Hail 08/01/2019, 2:56 PM  [facility] [address] [place], [place], [address] Phone: [phone]   Fax:  [phone]  Name: Neil [name] MRN: [id number] Date of Birth: February 15, 1946

## 2019-08-13 ENCOUNTER — Ambulatory Visit (HOSPITAL_COMMUNITY)
Admission: RE | Admit: 2019-08-13 | Discharge: 2019-08-13 | Disposition: A | Discharge status: Home / Self Care | Payer: Medicare Other | Source: Ambulatory Visit | Attending: Radiation Oncology | Admitting: Radiation Oncology

## 2019-08-13 ENCOUNTER — Other Ambulatory Visit: Payer: Self-pay

## 2019-08-13 DIAGNOSIS — C01 Malignant neoplasm of base of tongue: Secondary | ICD-10-CM | POA: Diagnosis not present

## 2019-08-13 DIAGNOSIS — I2510 Atherosclerotic heart disease of native coronary artery without angina pectoris: Secondary | ICD-10-CM | POA: Insufficient documentation

## 2019-08-13 DIAGNOSIS — I700 Atherosclerosis of aorta: Secondary | ICD-10-CM | POA: Insufficient documentation

## 2019-08-13 DIAGNOSIS — I517 Cardiomegaly: Secondary | ICD-10-CM | POA: Insufficient documentation

## 2019-08-13 DIAGNOSIS — C029 Malignant neoplasm of tongue, unspecified: Secondary | ICD-10-CM | POA: Diagnosis not present

## 2019-08-13 LAB — GLUCOSE, CAPILLARY: Glucose-Capillary: 104 mg/dL — ABNORMAL HIGH (ref 70–99)

## 2019-08-13 MED ORDER — FLUDEOXYGLUCOSE F - 18 (FDG) INJECTION
11.2500 | Freq: Once | INTRAVENOUS | Status: AC | PRN
  Administered 2019-08-13: 11.25 via INTRAVENOUS

## 2019-08-13 NOTE — Progress Notes (Signed)
[name] presents for follow up of radiation completed 01/03/19 to his base of tongue and bilateral neck nodes.   Pain issues, if any: Patient denies  Using a feeding tube?: PEG tube removed 05/02/2019. Patient feels like he gets "sour stomach" frequently and has to take an antacid to manage. "It doesn't feel as good as it used to. Almost feels like the stomach fused together in a way it shouldn't have"  Weight changes, if any:  Wt Readings from Last 3 Encounters:  08/14/19 227 lb 3.2 oz (103.1 kg)  05/14/19 223 lb 3.2 oz (101.2 kg)  04/18/19 223 lb 9.6 oz (101.4 kg)   Swallowing issues, if any: Patient reports occasional issues, but overall no issues. Was discharged from PT on 08/01/2019: "Pt is doing extremely well. He is managing his lymphedema with his comrpession garment, self MLD and Flexitouch.  He is increasing daily activities and feels that he has more energy and strength.  He has met all goals and is ready for discharge" Smoking or chewing tobacco? None Using fluoride trays daily? N/A--patient's doesn't have any more teeth and is interested in knowing if he should get new dentures/implants Last ENT visit was on: 05/28/2019 Saw Dr. Lind Guest: "My impression is that [name] has  1. 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 Tinnitus aurium, bilateral  No evidence of disease." Plan:  1. Return to clinic in 3 months disease surveillance, patient will see Dr. Constance Holster while I am out on maternity leave. 2. Will undergo audiogram at patient's convenience to further evaluate for his tinnitus. We discussed auditory distraction/masking."  Other notable issues, if any: Going to [place] later this summer for a fitting for new hearing aids.  Vitals:   08/14/19 1415  BP: (!) 145/87  Pulse: (!) 56  Resp: 20  Temp: (!) 97.3 F (36.3 C)  SpO2: 100%

## 2019-08-14 ENCOUNTER — Ambulatory Visit
Admission: RE | Admit: 2019-08-14 | Discharge: 2019-08-14 | Disposition: A | Discharge status: Home / Self Care | Payer: Medicare Other | Source: Ambulatory Visit | Attending: Radiation Oncology | Admitting: Radiation Oncology

## 2019-08-14 ENCOUNTER — Encounter: Payer: Self-pay | Admitting: Radiation Oncology

## 2019-08-14 ENCOUNTER — Other Ambulatory Visit: Payer: Self-pay

## 2019-08-14 VITALS — BP 145/87 | HR 56 | Temp 97.3°F | Resp 20 | Ht 70.0 in | Wt 227.2 lb

## 2019-08-14 DIAGNOSIS — K8020 Calculus of gallbladder without cholecystitis without obstruction: Secondary | ICD-10-CM | POA: Insufficient documentation

## 2019-08-14 DIAGNOSIS — C01 Malignant neoplasm of base of tongue: Secondary | ICD-10-CM | POA: Diagnosis present

## 2019-08-14 DIAGNOSIS — Z08 Encounter for follow-up examination after completed treatment for malignant neoplasm: Secondary | ICD-10-CM | POA: Diagnosis not present

## 2019-08-14 DIAGNOSIS — R590 Localized enlarged lymph nodes: Secondary | ICD-10-CM | POA: Diagnosis not present

## 2019-08-14 DIAGNOSIS — Z923 Personal history of irradiation: Secondary | ICD-10-CM | POA: Insufficient documentation

## 2019-08-14 DIAGNOSIS — Z79899 Other long term (current) drug therapy: Secondary | ICD-10-CM | POA: Diagnosis not present

## 2019-08-14 DIAGNOSIS — I890 Lymphedema, not elsewhere classified: Secondary | ICD-10-CM | POA: Insufficient documentation

## 2019-08-14 DIAGNOSIS — F329 Major depressive disorder, single episode, unspecified: Secondary | ICD-10-CM | POA: Insufficient documentation

## 2019-08-14 DIAGNOSIS — Z85810 Personal history of malignant neoplasm of tongue: Secondary | ICD-10-CM | POA: Diagnosis not present

## 2019-08-14 NOTE — Progress Notes (Signed)
Radiation Oncology         [phone] ________________________________  Name: Neil [name] MRN: [id number]  Date: 08/14/2019  DOB: 23-Jun-1945  Follow-Up Visit Note in person  CC: Verlon Au, MD  Billy Fischer MD  Diagnosis and Prior Radiotherapy:       ICD-10-CM   1. Cancer of base of tongue ([facility]CC)  C01 CT Soft Tissue Neck W Contrast    11/16/2018 through 01/03/2019  Site Technique Total Dose (Gy) Dose per Fx (Gy) Completed Fx Beam Energies  Head & neck: HN_BOT IMRT 70/70 2 35/35 6X    CHIEF COMPLAINT:  Here for follow-up and surveillance of base of tongue cancer  Narrative:   Mr. Hyden presents for follow up of radiation completed 01/03/19 to his base of tongue and bilateral neck nodes.   Pain issues, if any: Patient denies   Using a feeding tube?: PEG tube removed 05/02/2019.  He reports that his taste is still altered.  Weight changes, if any:  Wt Readings from Last 3 Encounters:  08/14/19 227 lb 3.2 oz (103.1 kg)  05/14/19 223 lb 3.2 oz (101.2 kg)  04/18/19 223 lb 9.6 oz (101.4 kg)   Swallowing issues, if any: Patient reports occasional issues, but overall no issues. Was discharged from PT on 08/01/2019: "Pt is doing extremely well. He is managing his lymphedema with his comrpession garment, self MLD and Flexitouch.  He is increasing daily activities and feels that he has more energy and strength.  He has met all goals and is ready for discharge" Smoking or chewing tobacco? None  Last ENT visit was on: 05/28/2019 Saw Dr. Billy Fischer and anticipates seeing Dr. Pollyann Kennedy later this month  He also has medical oncology follow-up with Marga Hoots later this week.  Other notable issues, if any: Going to [place] later this summer for a fitting for new hearing aids.   He reports having depressed mood that waxes and wanes; he is open to talking with the social worker about this and would consider counseling.   ALLERGIES:  is allergic to  lisinopril.  Meds: Current Outpatient Medications  Medication Sig Dispense Refill  . apixaban (ELIQUIS) 5 MG TABS tablet Take 1 tablet (5 mg total) by mouth 2 (two) times daily. 60 tablet 3  . co-enzyme Q-10 30 MG capsule Take 200 mg by mouth 2 (two) times daily.    . hydrochlorothiazide (HYDRODIURIL) 25 MG tablet Take 25 mg by mouth daily.    Marland Kitchen omeprazole (PRILOSEC) 20 MG capsule Take 20 mg by mouth daily.    . tamsulosin (FLOMAX) 0.4 MG CAPS capsule Take 0.4 mg by mouth daily after supper.    . metoprolol succinate (TOPROL-XL) 50 MG 24 hr tablet Take 1 tablet (50 mg total) by mouth daily. Take with or immediately following a meal. 90 tablet 3  . Nutritional Supplements (FEEDING SUPPLEMENT, OSMOLITE 1.5 CAL,) LIQD 1 1/2 bottles Osmolite 1.5 via PEG with 60 mL water before and after each bolus feeding QID. 1 OZ Prosource or equivalent via PEG QID with 60 mL water flush after. Provides 2530 kcal, 129 gm Pro: >90% estimated needs. Please send formula, Prosource and supplies. (Patient not taking: Reported on 08/14/2019) 1422 mL 6  . potassium chloride SA (KLOR-CON) 20 MEQ tablet Take 1 tablet (20 mEq total) by mouth daily for 5 days. 5 tablet 0   No current facility-administered medications for this encounter.    Physical Findings: The patient is in no acute distress. Patient is alert and  oriented. Wt Readings from Last 3 Encounters:  08/14/19 227 lb 3.2 oz (103.1 kg)  05/14/19 223 lb 3.2 oz (101.2 kg)  04/18/19 223 lb 9.6 oz (101.4 kg)    height is 5\' 10"  (1.778 m) and weight is 227 lb 3.2 oz (103.1 kg). His temperature is 97.3 F (36.3 C) (abnormal). His blood pressure is 145/87 (abnormal) and his pulse is 56 (abnormal). His respiration is 20 and oxygen saturation is 100%. .  General: Alert and oriented, in no acute distress HEENT: Head is normocephalic. Extraocular movements are intact. Oropharynx is notable for no thrush and no sign of tumor on external exam.  He is edentulous Neck: Neck  is notable for healed skin and mild lymphedema anteriorly, no palpable adenopathy in the cervical or supraclavicular regions Skin: Skin in treatment fields shows satisfactory healing  Psychiatric: Judgment and insight are intact. Affect is appropriate.   Lab Findings: Lab Results  Component Value Date   WBC 3.5 (L) 05/02/2019   HGB 16.4 05/02/2019   HCT 48.9 05/02/2019   MCV 93.5 05/02/2019   PLT 150 05/02/2019    Lab Results  Component Value Date   TSH 0.528 04/18/2019    Radiographic Findings: NM PET Image Restag (PS) Skull Base To Thigh  Result Date: 08/13/2019 CLINICAL DATA:  Subsequent treatment strategy for tongue cancer. EXAM: NUCLEAR MEDICINE PET SKULL BASE TO THIGH TECHNIQUE: 11.25 mCi F-18 FDG was injected intravenously. Full-ring PET imaging was performed from the skull base to thigh after the radiotracer. CT data was obtained and used for attenuation correction and anatomic localization. Fasting blood glucose: 104 mg/dl COMPARISON:  PET-CT dated 04/16/2019 FINDINGS: Mediastinal blood pool activity: SUV max 3.2 Liver activity: SUV max NA NECK: No residual hypermetabolism involving the right base of tongue. 5 mm short axis right level 2 node with max SUV 3.0, previously 8 mm with max SUV 4.7. Incidental CT findings: none CHEST: No suspicious pulmonary nodules. No hypermetabolic thoracic lymphadenopathy. Incidental CT findings: Cardiomegaly. Mild atherosclerotic calcifications of the aortic arch. Mild coronary atherosclerosis of the LAD. ABDOMEN/PELVIS: No abnormal hypermetabolism in the liver, spleen, pancreas, or adrenal glands. No hypermetabolic lymphadenopathy in the abdomen/pelvis. Incidental CT findings: Cholelithiasis. Atherosclerotic calcifications of the aortic arch. Prostatomegaly, suggesting BPH. Thick-walled bladder, suggesting chronic bladder obstruction. Fat containing left inguinal hernia. SKELETON: No focal hypermetabolic activity to suggest skeletal metastasis.  Incidental CT findings: Degenerative changes of the visualized thoracolumbar spine. IMPRESSION: Residual 5 mm short axis right level 2 cervical lymph node, improved. No evidence of distant metastasis. Electronically Signed   By: Charline Bills M.D.   On: 08/13/2019 11:58    Impression/Plan:    1) Head and Neck Cancer Status: In remission -I will order a CT scan of his neck in 6 months to monitor the lymph node that continues to shrink in the right level 2 neck.  He will be discussed at tumor board next week  2) Nutritional Status: No issues, PEG tube removed  3) Risk Factors: The patient has been educated about risk factors including alcohol and tobacco abuse; they understand that avoidance of alcohol and tobacco is important to prevent recurrences as well as other cancers  4) Swallowing: Functional, follows with SLP  5) Dental - He is edentulous   6) Thyroid function: Check annually Lab Results  Component Value Date   TSH 0.528 04/18/2019    7) Other: He had substantial taste changes that continue to slowly improve -I advised him to continue to try different foods  each day.  8) Referral to social work for depressed mood -this waxes and wanes, he does have a degree of social isolation, I think he would benefit from some counseling.  He is excellent at talking about his emotions.  We discussed ways for him to reintegrate with folks at his facility.  9) Plan for follow-up in 6 months with CT of neck.  10) We discussed measures to reduce the risk of infection during the COVID-19 pandemic.  He has been vaccinated.  On date of service, in total, I spent 45 minutes on this encounter.  This includes review of images personally, reviewing and adding to documentation, ordering follow-up imaging, and discussing all of the issues above with the patient.  He was seen face-to-face. _____________________________________   Lonie Peak, MD

## 2019-08-14 NOTE — Progress Notes (Signed)
Oncology Nurse Navigator Documentation  I met with patient during his follow up appointment with Dr. Isidore Moos today. I discussed with Neil [name] about becoming a mentor when we are able to begin mentor training sessions again. He very much agreed with the idea, and knows to expect a call from me for those sessions. At Dr. Pearlie Oyster request an order was placed for a SW referral. [name] will return in 6 months to see Dr. Isidore Moos again.   Harlow Asa RN, BSN, OCN Head & Neck Oncology Nurse [place] [facility] Phone # [phone]  Fax # [phone]

## 2019-08-15 ENCOUNTER — Telehealth: Payer: Self-pay | Admitting: Emergency Medicine

## 2019-08-15 ENCOUNTER — Encounter: Payer: Self-pay | Admitting: General Practice

## 2019-08-15 ENCOUNTER — Telehealth: Payer: Self-pay | Admitting: Medical

## 2019-08-15 NOTE — Telephone Encounter (Signed)
Contacted pt regarding f/u lab/appt with PA Lucianne Lei tomorrow (08/16/19) per PA request.  Pt reports eating/drinking well, denies any wt loss, and reports improvement overall in fatigue and depression.  Pt denies any questions/concerns at this time, agrees to move lab & f/u appt with PA Lucianne Lei to six weeks out from today.  Scheduling message sent to call pt regarding new appts date/times, appts for 08/16/19 at [facility] cancelled.

## 2019-08-15 NOTE — Telephone Encounter (Signed)
R/s appt per 6/9 sch message - pt is aware of appt.

## 2019-08-15 NOTE — Progress Notes (Signed)
[place] Initial Psychosocial Assessment Clinical Social Work  Clinical Social Work contacted by phone to assess psychosocial, emotional, mental health, and spiritual needs of the patient.   Barriers to care/review of distress screen:  - Transportation:  Do you anticipate any problems getting to appointments?  Do you have someone who can help run errands for you if you need it?  Still drives but can also hire someone to drive.  Limits his driving. - Help at home:  What is your living situation (alone, family, other)?  If you are physically unable to care for yourself, who would you call on to help you?  Lives in senior apartment complex, has part time caregiver who can help as needed.  He and friends in the complex are working on creating a support network for each other and are exploring options to make this happen - Support system:  What does your support system look like?  Who would you call on if you needed some kind of practical help?  What if you needed someone to talk to for emotional support?  Is working w others in his complex to form more social connections.  "We are finding our way in all this."  Pandemic has limited social interactions during [place].  [place]no options for socialization.  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  Has turned his living room into a studio.  Came to this area approx 20 years ago to care for mother w Alzheimers dementia.   - Finances:  Are you concerned about finances.  Considering returning to work?  If not, applying for disability?  No concerns voiced.    What is your understanding of where you are with your cancer? Its cause?  Your treatment plan and what happens next?  Has finished treatment for mouth cancer - "feel the best I have in years."  Is in surveillance for cancer.    What are your hopes and priorities during your treatment? What is important to you? What are your goals for your care?  Has been through medical  issues and COVID isolation for past year.  Now feels much better - "I have been locked away from the world for the past 4 - 5 years."  "Got energy and want to do something w my life and I dont know what to do."  Some mild depression, denies suicidal ideation.  Feels socially isolated, working to reengage and find activities of interest to him.    CSW Summary:  Patient and family psychosocial functioning including strengths, limitations, and coping skills:  Active 74 year old male, in surveillance for previously treated mouth cancer.  Currently feeling much improved healthwise and has more energy.  Emerging from [place], working on plans to reengage w people and activities of interest.  Although he reports some depressed times, especially as he thinks about need to "reinvent" himself in retirement years, he is also making significant plans on how to connect w people and activities.  Is building a good support community post COVID in his senior apartment complex.  Is resilient in coping w changes as a result of cancer treatment (dry mouth, frequent urination).  He has the skills and resources to work things out on his own, is not currently interested in referral for therapist.  Advised patient that he can call back later if his desires/need change and we can help him be linked w possible providers.  He has indicated a willingness to serve as a  mentor to patients newly diagnosed w head/neck cancer as one way to "give back."  When available, he may be a good candidate for Head and Neck FYNN (Finding Your New Normal).  Identifications of barriers to care: social isolation, small town w limited resources for support services for senior citizens  Availability of community resources:  [employer], [place] Social Worker follow up needed: No.  Patient will call as needed. Emailed him information so he has my contact information.  Edwyna Shell, LCSW Clinical Social Worker Phone:   [phone] Cell:  [phone]

## 2019-08-16 ENCOUNTER — Inpatient Hospital Stay: Payer: Medicare Other | Admitting: Medical

## 2019-08-16 ENCOUNTER — Inpatient Hospital Stay: Payer: Medicare Other

## 2019-09-27 ENCOUNTER — Telehealth: Payer: Self-pay | Admitting: Medical

## 2019-09-27 ENCOUNTER — Other Ambulatory Visit: Payer: Self-pay

## 2019-09-27 ENCOUNTER — Inpatient Hospital Stay (HOSPITAL_BASED_OUTPATIENT_CLINIC_OR_DEPARTMENT_OTHER): Payer: Medicare Other | Admitting: Medical

## 2019-09-27 ENCOUNTER — Inpatient Hospital Stay: Payer: Medicare Other | Attending: Medical

## 2019-09-27 VITALS — BP 143/85 | HR 57 | Temp 97.9°F | Resp 17 | Ht 70.0 in | Wt 229.7 lb

## 2019-09-27 DIAGNOSIS — R1310 Dysphagia, unspecified: Secondary | ICD-10-CM | POA: Diagnosis not present

## 2019-09-27 DIAGNOSIS — I890 Lymphedema, not elsewhere classified: Secondary | ICD-10-CM | POA: Diagnosis not present

## 2019-09-27 DIAGNOSIS — C01 Malignant neoplasm of base of tongue: Secondary | ICD-10-CM

## 2019-09-27 DIAGNOSIS — E0789 Other specified disorders of thyroid: Secondary | ICD-10-CM

## 2019-09-27 DIAGNOSIS — Z85810 Personal history of malignant neoplasm of tongue: Secondary | ICD-10-CM | POA: Diagnosis not present

## 2019-09-27 LAB — CMP (CANCER CENTER ONLY)
ALT: 6 U/L (ref 0–44)
AST: 17 U/L (ref 15–41)
Albumin: 3.9 g/dL (ref 3.5–5.0)
Alkaline Phosphatase: 32 U/L — ABNORMAL LOW (ref 38–126)
Anion gap: 11 (ref 5–15)
BUN: 17 mg/dL (ref 8–23)
CO2: 25 mmol/L (ref 22–32)
Calcium: 9.4 mg/dL (ref 8.9–10.3)
Chloride: 104 mmol/L (ref 98–111)
Creatinine: 1.21 mg/dL (ref 0.61–1.24)
GFR, Est AFR Am: >60 mL/min (ref >60)
GFR, Estimated: 58 mL/min — ABNORMAL LOW (ref >60)
Glucose, Bld: 99 mg/dL (ref 70–99)
Potassium: 3.2 mmol/L — ABNORMAL LOW (ref 3.5–5.1)
Sodium: 140 mmol/L (ref 135–145)
Total Bilirubin: 1.3 mg/dL — ABNORMAL HIGH (ref 0.3–1.2)
Total Protein: 6.7 g/dL (ref 6.5–8.1)

## 2019-09-27 LAB — CBC WITH DIFFERENTIAL (CANCER CENTER ONLY)
Abs Immature Granulocytes: 0.03 10*3/uL (ref 0.00–0.07)
Basophils Absolute: 0 10*3/uL (ref 0.0–0.1)
Basophils Relative: 1 %
Eosinophils Absolute: 0.1 10*3/uL (ref 0.0–0.5)
Eosinophils Relative: 2 %
HCT: 45.2 % (ref 39.0–52.0)
Hemoglobin: 15.9 g/dL (ref 13.0–17.0)
Immature Granulocytes: 1 %
Lymphocytes Relative: 14 %
Lymphs Abs: 0.7 10*3/uL (ref 0.7–4.0)
MCH: 31.3 pg (ref 26.0–34.0)
MCHC: 35.2 g/dL (ref 30.0–36.0)
MCV: 89 fL (ref 80.0–100.0)
Monocytes Absolute: 0.5 10*3/uL (ref 0.1–1.0)
Monocytes Relative: 9 %
Neutro Abs: 3.5 10*3/uL (ref 1.7–7.7)
Neutrophils Relative %: 73 %
Platelet Count: 156 10*3/uL (ref 150–400)
RBC: 5.08 MIL/uL (ref 4.22–5.81)
RDW: 13.2 % (ref 11.5–15.5)
WBC Count: 4.8 10*3/uL (ref 4.0–10.5)
nRBC: 0 % (ref 0.0–0.2)

## 2019-09-27 LAB — TSH: TSH: 3.759 u[IU]/mL (ref 0.320–4.118)

## 2019-09-27 NOTE — Telephone Encounter (Signed)
Scheduled per 07/22 los, patient will be notified per My chart.

## 2019-10-01 NOTE — Progress Notes (Signed)
[place]       [phone] Fax:([phone]) [facility]  Oral and Oropharyngeal Cancer     CLINIC:    Survivorship      REASON FOR VISIT:    Routine follow-up for history of oral and oropharyngeal cancer (cancer of base of tongue )      BRIEF ONCOLOGIC HISTORY:    Oncology History  Cancer of base of tongue ([place])  07/29/2018 Imaging   Neck ultrasound: IMPRESSION: 16 x 30 mm solid mass in the right submandibular area. This may represent a submandibular mass or enlarged lymph node. Recommend CT neck with contrast for further evaluation.   08/03/2018 Imaging   CT neck (at [facility]): IMPRESSION: 1. 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 There is a single ipsilateral malignant lymph node measuring 3 cm. 2. Nodular thickening along the anterior right sublingual gland, attention on follow-up PET.   09/04/2018 Procedure   US-guided bx of the R submandibular mass   09/04/2018 Pathology Results   Accession: [id number]  Lymph node, needle/core biopsy, right submandibular - SQUAMOUS CELL CARCINOMA. - SEE MICROSCOPIC DESCRIPTION.   09/11/2018 Imaging   PET: IMPRESSION: Hypermetabolism along the right base of tongue, corresponding to the patient's known primary oropharyngeal cancer.   Ipsilateral level 2 cervical nodal metastasis.   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   11/16/2018 -  Chemotherapy   The patient had palonosetron (ALOXI) injection 0.25 mg, 0.25 mg, Intravenous,  Once, 6 of 6 cycles Administration: 0.25 mg (11/16/2018), 0.25 mg (11/23/2018), 0.25 mg (11/30/2018), 0.25 mg (12/07/2018), 0.25 mg (12/14/2018), 0.25 mg (12/28/2018) CARBOplatin (PARAPLATIN) 200 mg in sodium  chloride 0.9 % 250 mL chemo infusion, 200 mg (100 % of original dose 202.8 mg), Intravenous,  Once, 6 of 6 cycles Dose modification: 202.8 mg (original dose 202.8 mg, Cycle 1), 260 mg (original dose 202.8 mg, Cycle 6, Reason: Provider Judgment) Administration: 200 mg (11/16/2018), 200 mg (11/23/2018), 260 mg (11/30/2018), 260 mg (12/07/2018), 260 mg (12/14/2018), 260 mg (12/28/2018)  for chemotherapy treatment.          INTERVAL HISTORY:    Neil [name] is a 74 y.o. 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 He presents to clinic today for routine follow up having last been seen on 08/14/2019 by Dr. Isidore Moos and on 04/18/2019 by Dr [name]. He he continues to use compression devices for lymphedema of his head and neck.  He was last seen in the lymphedema clinic 2 to 3 months ago.  He was also seen by speech pathology who discharged him several months ago.  He reports that his mouth is dry and that he has some mild swallowing difficulty.  He reports that he eats very small bites of food and has to chew them for extended periods.  Also he reports that he has changes in the taste of food.  He is attempting weight loss.  He is currently 229.7 pounds.  He would like to lose approximately 25 more pounds before seeing a dentist regarding getting new dentures.  He is concerned that his mouth will change if he were to get dentures now that have weight  loss.     REVIEW OF SYSTEMS:   Visual changes:    no   Swelling of the face:    yes   Swelling of the mouth:    no   Swelling of the throat:    yes   Dental problems:    yes   Skin changes at treatment site:  no   Dry mouth:     yes   Difficulty talking:    no  Thickened saliva:    no   Increased mucus:    no  Difficulty opening mouth:   no  Fatigue:     no   Pain or difficulty swallowing or chewing: yes    Loss of appetite:    no   Numbness of the ear:    no  Weakness raising  the arm(s):  no  Lack of lip movement:    no  Facial disfigurement:    no  Numbness of face or neck:   no  Loss of voice or impaired speech:  no  Hearing difficulty:    no  Lymphedema:     yes  Pain:      no       -Weight: has not lost weight      -Last ENT visit:    09/04/2018   -Last Rad Onc visit:   08/14/2019   -Last Dentist visit:   10/16/2018      CURRENT MEDICATIONS:    Current Outpatient Medications on File Prior to Visit  Medication Sig Dispense Refill  . apixaban (ELIQUIS) 5 MG TABS tablet Take 1 tablet (5 mg total) by mouth 2 (two) times daily. 60 tablet 3  . co-enzyme Q-10 30 MG capsule Take 200 mg by mouth 2 (two) times daily.    . hydrochlorothiazide (HYDRODIURIL) 25 MG tablet Take 25 mg by mouth daily.    Marland Kitchen omeprazole (PRILOSEC) 20 MG capsule Take 20 mg by mouth daily.    . tamsulosin (FLOMAX) 0.4 MG CAPS capsule Take 0.4 mg by mouth daily after supper.    . metoprolol succinate (TOPROL-XL) 50 MG 24 hr tablet Take 1 tablet (50 mg total) by mouth daily. Take with or immediately following a meal. 90 tablet 3   No current facility-administered medications on file prior to visit.        ALLERGIES:    Allergies  Allergen Reactions  . Lisinopril Cough        ADDITIONAL REVIEW OF SYSTEMS:    Review of Systems  Constitutional: Negative for chills, diaphoresis, fever, malaise/fatigue and weight loss.  HENT: Negative for hearing loss, sore throat and tinnitus.        Dry mouth with difficulty swallowing and with lymphedema of the head neck.  Respiratory: Negative for cough, shortness of breath and wheezing.   Cardiovascular: Negative for chest pain, palpitations, orthopnea and leg swelling.  Gastrointestinal: Negative for constipation, diarrhea, nausea and vomiting.  Genitourinary: Negative for frequency and urgency.  Musculoskeletal: Negative for back pain and neck pain.  Skin: Negative for rash.  Neurological: Negative for dizziness and  weakness.  Psychiatric/Behavioral: Negative for depression. The patient is not nervous/anxious.         PHYSICAL EXAM:    Vitals:   09/27/19 1123  BP: (!) 143/85  Pulse: 57  Resp: 17  Temp: 97.9 F (36.6 C)  SpO2: 100%    Filed Weights   09/27/19 1123  Weight: (!) 229 lb 11.2 oz (104.2 kg)    Weight Date  229.7 pounds  09/27/2019  223 pounds 3.2 ounces  05/14/2019  223 pounds 9.6 ounces  04/18/2019            Pre-treatment (RT consult date):     General: Kylil Swopes is a 74 y.o. male who appears to be in no acute distress.? Anees Vanecek is unaccompanied today.    Physical Exam Constitutional:      General: He is not in acute distress.    Appearance: Normal appearance. He is not ill-appearing, toxic-appearing or diaphoretic.  HENT:     Head: Normocephalic and atraumatic.     Right Ear: Tympanic membrane, ear canal and external ear normal.     Left Ear: Tympanic membrane, ear canal and external ear normal.     Mouth/Throat:     Mouth: Mucous membranes are moist.     Pharynx: Oropharynx is clear. No oropharyngeal exudate or posterior oropharyngeal erythema.  Eyes:     General: No scleral icterus.       Right eye: No discharge.        Left eye: No discharge.     Conjunctiva/sclera: Conjunctivae normal.  Neck:     Comments: Mild thickening noted of the skin over the anterior cervical neck. Cardiovascular:     Rate and Rhythm: Normal rate and regular rhythm.     Heart sounds: No murmur heard.  No friction rub. No gallop.   Pulmonary:     Effort: Pulmonary effort is normal. No respiratory distress.     Breath sounds: Normal breath sounds. No wheezing, rhonchi or rales.  Abdominal:     General: Abdomen is flat. There is no distension.     Palpations: Abdomen is soft. There is no mass.     Tenderness: There is no abdominal tenderness. There is no guarding or rebound.  Musculoskeletal:     Cervical back: No rigidity or tenderness.     Right lower leg: No edema.       Left lower leg: No edema.  Lymphadenopathy:     Cervical: No cervical adenopathy.  Skin:    General: Skin is warm and dry.     Coloration: Skin is not jaundiced or pale.     Findings: No bruising, erythema, lesion or rash.  Neurological:     Mental Status: He is alert.     Gait: Gait normal.     Deep Tendon Reflexes: Reflexes normal.  Psychiatric:        Mood and Affect: Mood normal.        Behavior: Behavior normal.        Thought Content: Thought content normal.        Judgment: Judgment normal.        LABORATORY DATA:    The patient's complete chemistry panel returned showing:   Lab Results  Component Value Date   NA 140 09/27/2019   K 3.2 (L) 09/27/2019   CL 104 09/27/2019   CO2 25 09/27/2019   GLUCOSE 99 09/27/2019   BUN 17 09/27/2019   CREATININE 1.21 09/27/2019   ALBUMIN 3.9 09/27/2019   ALKPHOS 32 (L) 09/27/2019   ALT 6 09/27/2019   AST 17 09/27/2019   BILITOT 1.3 (H) 09/27/2019        The patient's TSH returned at:  Lab Results  Component Value Date   TSH 3.759 09/27/2019        The patient's complete blood count returned showing:      Component Value Date/Time   WBC 4.8 09/27/2019  1103   WBC 3.5 (L) 05/02/2019 1215   RBC 5.08 09/27/2019 1103   HGB 15.9 09/27/2019 1103   HCT 45.2 09/27/2019 1103   PLT 156 09/27/2019 1103   MCV 89.0 09/27/2019 1103   MCH 31.3 09/27/2019 1103   MCHC 35.2 09/27/2019 1103   RDW 13.2 09/27/2019 1103   LYMPHSABS 0.7 09/27/2019 1103   MONOABS 0.5 09/27/2019 1103   EOSABS 0.1 09/27/2019 1103   BASOSABS 0.0 09/27/2019 1103        DIAGNOSTIC IMAGING:    No results found.       ASSESSMENT & PLAN:    Neil [name] is a 74 y.o. 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 He was treated with concurrent chemoreadiation which he completed treatment on 01/03/2019.  He presents to the survivorship clinic  today for routine follow-up having last been seen on on 08/14/2019 by Dr. Isidore Moos and on 04/18/2019 by Dr [name].      1. 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 Angell is clinically without evidence of residual or recurrence cancer on physical exam today.         2. Nutritional status: Mr. Blanchfield reports that he is currently able to consume adequate nutrition by mouth.  His weight is higher at 229.7 lbs today.  He was encouraged to continue to consume adequate hydration and nutrition, as tolerated.        3. At risk for dysphagia: Given Mr. 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  He reports having mild difficulty with certain foods. He is able to consume most foods and liquids without difficulty.  I encouraged him to continue to perform the swallowing exercises, as directed by Garald Balding, SLP.  If Mr. Spofford requires further swallowing or speech therapy evaluation, I will be happy to place that referral, if needed.  Currently, the patient's reported swallowing concerns are to be expected and stable.        4.  At risk for neck lymphedema:  When patients with head & neck cancers are treated with surgery and/or radiation therapy, there is an associated increased risk of neck lymphedema.  Mr. Bankson reports that currently he is experiencing symptoms. He considers this to be mild. He does have a neck compression garment and reports wearing it, as directed.  I encouraged Mr. Lisenby to continue to wear the compression garment and practice the massage techniques to reduce the presence of lymphedema.  If his symptoms worsen, I would happy to place a formal referral to physical therapy for further evaluation and treatment.         5.  At risk for hypothyroidism: The thyroid gland is often affected  after treatment for head & neck cancer.  Mr. Goodell most recent TSH was as noted below: Lab Results  Component Value Date   TSH 3.759 09/27/2019  We discussed that he will continue to have serial TSH monitoring for at least the next 5 years as part of his routine follow-up and post-cancer treatment care.     6. At risk for tooth decay/dental concerns: After treatment with radiation for head & neck cancers, patients often experience dry mouth which increases  their risk of dental caries. Mr. Hooton was encouraged to see his dentist 3-4 times per year.       DISPOSITION:    -See Dr. Lind Guest  (ENT) as needed.   -Return to cancer center to see Dr. Isidore Moos on 02/15/2020.  -Return to cancer center to see Survivorship PA in October, 2021.       A total of 45 minutes was spent in the face-to-face care of this patient, with greater than 50% of that time spent in counseling and care-coordination.       Sandi Mealy, MHS, PA-C Physician Assistant [facility]    10/01/19   10:28 AM        NOTE: PRIMARY CARE PROVIDER   Bartholome Bill, MD   Phone: [phone]   Fax: [phone]

## 2019-10-04 ENCOUNTER — Other Ambulatory Visit: Payer: Self-pay | Admitting: Cardiovascular Disease

## 2019-11-12 NOTE — Progress Notes (Signed)
Cardiology Office Note:   Date:  11/15/2019  NAME:  Neil [name]    MRN: [id number] DOB:  [birth year]   PCP:  Verlon Au, MD  Cardiologist:  Reatha Harps, MD   Referring MD: Verlon Au, MD   Chief Complaint  Patient presents with  . Follow-up   History of Present Illness:   Neil [name] is a 74 y.o. male with a hx of SCC of the tongue, permanent Afib, HTN who presents for follow-up.  He reports he is doing well.  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  He is undergoing speech therapy.  He reports he is not exercising routinely but has no issues with walking 5 to 10 minutes.  He does have some issues with arthritis in the knees.  He reports no chest pain, shortness of breath or palpitations.  He does report some intermittent burning sensation in his chest related to eating.  Again this is related to his cancer treatment.  He will see ENT soon.  Heart rate is well controlled today.  No issues on Eliquis.  Overall seems to be doing quite well.  He denies any lower extreme edema.  He is watching his salt.  Weights are stable.  Problem List 1. Atrial fibrillation, permanent  -failed multiple cardioversions -CHADSVASC=3  2. HTN 3. SCC tongue   Past Medical History: Past Medical History:  Diagnosis Date  . Atrial fibrillation (HCC)   . Chronic anticoagulation    On Eliquis  . GERD (gastroesophageal reflux disease)   . History of radiation therapy 11/16/18- 01/03/19   Head and Neck, Base of Tongue 35 fractions of 2 Gy each for a total of 70 Gy.   Marland Kitchen Hypertension   . Joint pain     Past Surgical History: Past Surgical History:  Procedure Laterality Date  . HEMORROIDECTOMY  2004  . HERNIA REPAIR  1960  . IR GASTROSTOMY TUBE MOD SED  11/07/2018  . IR GASTROSTOMY TUBE REMOVAL  05/02/2019  . IR IMAGING GUIDED PORT INSERTION  11/07/2018  . IR PATIENT EVAL TECH 0-60 MINS  03/30/2019  . IR REMOVAL TUN ACCESS W/ PORT W/O FL MOD SED  05/02/2019    . IR REPLC GASTRO/COLONIC TUBE PERCUT W/FLUORO  01/01/2019  . IR US GUIDANCE  09/04/2018  . IR US GUIDE BX ASP/DRAIN  09/04/2018  . MOUTH BIOPSY  09/04/2018   right jaw  . right testical removed    . TONSILLECTOMY      Current Medications: Current Meds  Medication Sig  . co-enzyme Q-10 30 MG capsule Take 200 mg by mouth 2 (two) times daily.  Marland Kitchen ELIQUIS 5 MG TABS tablet TAKE 1 TABLET BY MOUTH TWICE A DAY  . hydrochlorothiazide (HYDRODIURIL) 25 MG tablet Take 25 mg by mouth daily.  . metoprolol succinate (TOPROL-XL) 50 MG 24 hr tablet Take 1 tablet (50 mg total) by mouth daily. Take with or immediately following a meal.  . Multiple Vitamin (MULTIVITAMIN) capsule Take 1 capsule by mouth daily.  Marland Kitchen omeprazole (PRILOSEC) 20 MG capsule Take 20 mg by mouth daily.  . Probiotic Product (PROBIOTIC PO) Take by mouth.  . tamsulosin (FLOMAX) 0.4 MG CAPS capsule Take 0.4 mg by mouth daily after supper.     Allergies:    Lisinopril   Social History: Social History   Socioeconomic History  . Marital status: Divorced    Spouse name: Not on file  . Number of children: 1  .  Years of education: Not on file  . Highest education level: Not on file  Occupational History  . Not on file  Tobacco Use  . Smoking status: Former Smoker    Packs/day: 1.00    Years: 5.00    Pack years: 5.00    Quit date: 1970    Years since quitting: 51.7  . Smokeless tobacco: Never Used  Vaping Use  . Vaping Use: Never used  Substance and Sexual Activity  . Alcohol use: Not Currently    Comment: none since recent diagnosis.   . Drug use: No  . Sexual activity: Not on file  Other Topics Concern  . Not on file  Social History Narrative   Patient is divorced with 1 daughter living in [place].   Patient with history of smoking approximately 1 pack/day for 5 years.  Patient quit in 1970.   Patient has never used smokeless tobacco.   Patient with a history of drinking wine occasionally.  None since June 2020.    Patient denies use of illicit drugs.   Social Determinants of Health   Financial Resource Strain:   . Difficulty of Paying Living Expenses: Not on file  Food Insecurity:   . Worried About Programme researcher, broadcasting/film/video in the Last Year: Not on file  . Ran Out of Food in the Last Year: Not on file  Transportation Needs:   . Lack of Transportation (Medical): Not on file  . Lack of Transportation (Non-Medical): Not on file  Physical Activity:   . Days of Exercise per Week: Not on file  . Minutes of Exercise per Session: Not on file  Stress:   . Feeling of Stress : Not on file  Social Connections:   . Frequency of Communication with Friends and Family: Not on file  . Frequency of Social Gatherings with Friends and Family: Not on file  . Attends Religious Services: Not on file  . Active Member of Clubs or Organizations: Not on file  . Attends Banker Meetings: Not on file  . Marital Status: Not on file     Family History: The patient's family history includes Alzheimer's disease in his mother.  ROS:   All other ROS reviewed and negative. Pertinent positives noted in the HPI.     EKGs/Labs/Other Studies Reviewed:   The following studies were personally reviewed by me today:  EKG:  EKG is ordered today.  The ekg ordered today demonstrates atrial fibrillation, heart rate 56, no acute ST-T changes, no evidence of prior infarction, single PVC, and was personally reviewed by me.   TTE 04/05/2019 1. Left ventricular ejection fraction, by visual estimation, is 55 to  60%. The left ventricle has normal function. There is mildly increased  left ventricular hypertrophy.  2. Left ventricular diastolic function could not be evaluated.  3. The left ventricle has no regional wall motion abnormalities.  4. Global right ventricle has normal systolic function.The right  ventricular size is normal.  5. Left atrial size was moderately dilated.  6. Right atrial size was mildly dilated.   7. The mitral valve is normal in structure. Mild mitral valve  regurgitation. No evidence of mitral stenosis.  8. The tricuspid valve is normal in structure. Tricuspid valve  regurgitation is mild.  9. The aortic valve is tricuspid. Aortic valve regurgitation is mild.  Mild aortic valve sclerosis without stenosis.  10. The pulmonic valve was grossly normal. Pulmonic valve regurgitation is  trivial.  11. Aortic dilatation noted.  12. There is mild dilatation of the ascending aorta measuring 40 mm.  13. The inferior vena cava is normal in size with greater than 50%  respiratory variability, suggesting right atrial pressure of 3 mmHg.  14. Normal LV function; mild LVH; mildly dilated ascending aorta; mild AI  and MR; biatrial enlargement.   Recent Labs: 12/27/2018: Magnesium 2.0 09/27/2019: ALT 6; BUN 17; Creatinine 1.21; Hemoglobin 15.9; Platelet Count 156; Potassium 3.2; Sodium 140; TSH 3.759   Recent Lipid Panel No results found for: CHOL, TRIG, HDL, CHOLHDL, VLDL, LDLCALC, LDLDIRECT  Physical Exam:   VS:  BP 134/72   Pulse (!) 56   Ht 5\' 10"  (1.778 m)   Wt 236 lb 6.4 oz (107.2 kg)   SpO2 98%   BMI 33.92 kg/m    Wt Readings from Last 3 Encounters:  11/15/19 236 lb 6.4 oz (107.2 kg)  09/27/19 (!) 229 lb 11.2 oz (104.2 kg)  08/14/19 227 lb 3.2 oz (103.1 kg)    General: Well nourished, well developed, in no acute distress Heart: Atraumatic, normal size  Eyes: PEERLA, EOMI  Neck: Supple, no JVD Endocrine: No thryomegaly Cardiac: Normal S1, S2; RRR; no murmurs, rubs, or gallops Lungs: Clear to auscultation bilaterally, no wheezing, rhonchi or rales  Abd: Soft, nontender, no hepatomegaly  Ext: No edema, pulses 2+ Musculoskeletal: No deformities, BUE and BLE strength normal and equal Skin: Warm and dry, no rashes   Neuro: Alert and oriented to person, place, time, and situation, CNII-XII grossly intact, no focal deficits  Psych: Normal mood and affect   ASSESSMENT:    Neil [name] is a 74 y.o. male who presents for the following: 1. Permanent atrial fibrillation (HCC)   2. Essential hypertension     PLAN:   1. Permanent atrial fibrillation (HCC) -CHADSVASC = 3.  He will remain on Eliquis. -He was followed with weight for system and failed multiple cardioversions.  He is okay to remain in A. fib.  No symptoms from this.  He is doing well with rate control on metoprolol.  2. Essential hypertension -Well-controlled today.  No change in medications.  He will continue his metoprolol as well as his HCTZ.  Disposition: Return in about 6 months (around 05/14/2020).  Medication Adjustments/Labs and Tests Ordered: Current medicines are reviewed at length with the patient today.  Concerns regarding medicines are outlined above.  Orders Placed This Encounter  Procedures  . EKG 12-Lead   No orders of the defined types were placed in this encounter.   Patient Instructions  Medication Instructions:  The current medical regimen is effective;  continue present plan and medications.  *If you need a refill on your cardiac medications before your next appointment, please call your pharmacy*   Follow-Up: At [facility], you and your health needs are our priority.  As part of our continuing mission to provide you with exceptional heart care, we have created designated Provider Care Teams.  These Care Teams include your primary Cardiologist (physician) and Advanced Practice Providers (APPs -  Physician Assistants and Nurse Practitioners) who all work together to provide you with the care you need, when you need it.  We recommend signing up for the patient portal called "MyChart".  Sign up information is provided on this After Visit Summary.  MyChart is used to connect with patients for Virtual Visits (Telemedicine).  Patients are able to view lab/test results, encounter notes, upcoming appointments, etc.  Non-urgent messages can be sent to your provider as well.  To learn more about what you can do with MyChart, go to [email or web address].    Your next appointment:   6 month(s)  The format for your next appointment:   In Person  Provider:   Lennie Odor, MD        Time Spent with Patient: I have spent a total of 25 minutes with patient reviewing hospital notes, telemetry, EKGs, labs and examining the patient as well as establishing an assessment and plan that was discussed with the patient.  > 50% of time was spent in direct patient care.  Signed, Lenna Gilford. Flora Lipps, MD [facility]  [address] [place], [place] [address] [phone]  11/15/2019 1:15 PM

## 2019-11-15 ENCOUNTER — Encounter: Payer: Self-pay | Admitting: Cardiovascular Disease

## 2019-11-15 ENCOUNTER — Other Ambulatory Visit: Payer: Self-pay

## 2019-11-15 ENCOUNTER — Ambulatory Visit (INDEPENDENT_AMBULATORY_CARE_PROVIDER_SITE_OTHER): Payer: Medicare Other | Admitting: Cardiovascular Disease

## 2019-11-15 VITALS — BP 134/72 | HR 56 | Ht 70.0 in | Wt 236.4 lb

## 2019-11-15 DIAGNOSIS — I4821 Permanent atrial fibrillation: Secondary | ICD-10-CM

## 2019-11-15 DIAGNOSIS — I10 Essential (primary) hypertension: Secondary | ICD-10-CM | POA: Diagnosis not present

## 2019-11-15 NOTE — Patient Instructions (Signed)
Medication Instructions:  The current medical regimen is effective;  continue present plan and medications.  *If you need a refill on your cardiac medications before your next appointment, please call your pharmacy*   Follow-Up: At [facility], you and your health needs are our priority.  As part of our continuing mission to provide you with exceptional heart care, we have created designated Provider Care Teams.  These Care Teams include your primary Cardiologist (physician) and Advanced Practice Providers (APPs -  Physician Assistants and Nurse Practitioners) who all work together to provide you with the care you need, when you need it.  We recommend signing up for the patient portal called "MyChart".  Sign up information is provided on this After Visit Summary.  MyChart is used to connect with patients for Virtual Visits (Telemedicine).  Patients are able to view lab/test results, encounter notes, upcoming appointments, etc.  Non-urgent messages can be sent to your provider as well.   To learn more about what you can do with MyChart, go to [email or web address].    Your next appointment:   6 month(s)  The format for your next appointment:   In Person  Provider:   Rives O'Neal, MD      

## 2019-12-26 ENCOUNTER — Inpatient Hospital Stay (HOSPITAL_BASED_OUTPATIENT_CLINIC_OR_DEPARTMENT_OTHER): Payer: Medicare Other | Admitting: Medical

## 2019-12-26 ENCOUNTER — Inpatient Hospital Stay: Payer: Medicare Other | Attending: Medical

## 2019-12-26 ENCOUNTER — Other Ambulatory Visit: Payer: Self-pay

## 2019-12-26 VITALS — BP 138/78 | HR 64 | Temp 96.6°F | Resp 18 | Ht 70.0 in | Wt 232.8 lb

## 2019-12-26 DIAGNOSIS — Z9221 Personal history of antineoplastic chemotherapy: Secondary | ICD-10-CM | POA: Diagnosis not present

## 2019-12-26 DIAGNOSIS — R221 Localized swelling, mass and lump, neck: Secondary | ICD-10-CM | POA: Insufficient documentation

## 2019-12-26 DIAGNOSIS — Z85810 Personal history of malignant neoplasm of tongue: Secondary | ICD-10-CM | POA: Diagnosis not present

## 2019-12-26 DIAGNOSIS — E0789 Other specified disorders of thyroid: Secondary | ICD-10-CM

## 2019-12-26 DIAGNOSIS — E0580 Other thyrotoxicosis without thyrotoxic crisis or storm: Secondary | ICD-10-CM | POA: Insufficient documentation

## 2019-12-26 DIAGNOSIS — C01 Malignant neoplasm of base of tongue: Secondary | ICD-10-CM | POA: Diagnosis not present

## 2019-12-26 LAB — CBC WITH DIFFERENTIAL (CANCER CENTER ONLY)
Abs Immature Granulocytes: 0.02 10*3/uL (ref 0.00–0.07)
Basophils Absolute: 0 10*3/uL (ref 0.0–0.1)
Basophils Relative: 1 %
Eosinophils Absolute: 0.1 10*3/uL (ref 0.0–0.5)
Eosinophils Relative: 2 %
HCT: 44.9 % (ref 39.0–52.0)
Hemoglobin: 15.6 g/dL (ref 13.0–17.0)
Immature Granulocytes: 1 %
Lymphocytes Relative: 16 %
Lymphs Abs: 0.7 10*3/uL (ref 0.7–4.0)
MCH: 31 pg (ref 26.0–34.0)
MCHC: 34.7 g/dL (ref 30.0–36.0)
MCV: 89.3 fL (ref 80.0–100.0)
Monocytes Absolute: 0.5 10*3/uL (ref 0.1–1.0)
Monocytes Relative: 11 %
Neutro Abs: 3 10*3/uL (ref 1.7–7.7)
Neutrophils Relative %: 69 %
Platelet Count: 145 10*3/uL — ABNORMAL LOW (ref 150–400)
RBC: 5.03 MIL/uL (ref 4.22–5.81)
RDW: 13.2 % (ref 11.5–15.5)
WBC Count: 4.4 10*3/uL (ref 4.0–10.5)
nRBC: 0 % (ref 0.0–0.2)

## 2019-12-26 LAB — CMP (CANCER CENTER ONLY)
ALT: 8 U/L (ref 0–44)
AST: 18 U/L (ref 15–41)
Albumin: 3.9 g/dL (ref 3.5–5.0)
Alkaline Phosphatase: 27 U/L — ABNORMAL LOW (ref 38–126)
Anion gap: 9 (ref 5–15)
BUN: 14 mg/dL (ref 8–23)
CO2: 25 mmol/L (ref 22–32)
Calcium: 9.4 mg/dL (ref 8.9–10.3)
Chloride: 105 mmol/L (ref 98–111)
Creatinine: 1.04 mg/dL (ref 0.61–1.24)
GFR, Estimated: >60 mL/min (ref >60)
Glucose, Bld: 94 mg/dL (ref 70–99)
Potassium: 3.4 mmol/L — ABNORMAL LOW (ref 3.5–5.1)
Sodium: 139 mmol/L (ref 135–145)
Total Bilirubin: 1 mg/dL (ref 0.3–1.2)
Total Protein: 6.6 g/dL (ref 6.5–8.1)

## 2019-12-26 LAB — TSH: TSH: 4.303 u[IU]/mL — ABNORMAL HIGH (ref 0.320–4.118)

## 2019-12-26 NOTE — Progress Notes (Signed)
Pt seen by PA Lucianne Lei only, no assessment by [facility] RN at this time d/t time constraints.  PA aware.

## 2020-01-16 ENCOUNTER — Telehealth: Payer: Self-pay | Admitting: Medical

## 2020-01-16 NOTE — Progress Notes (Signed)
[place]       [phone] Fax:([phone]) [facility]  Oral and Oropharyngeal Cancer     CLINIC:    Survivorship      REASON FOR VISIT:    Routine follow-up for history of oral and oropharyngeal cancer (cancer of base of tongue )      BRIEF ONCOLOGIC HISTORY:    Oncology History  Cancer of base of tongue ([place])  07/29/2018 Imaging   Neck ultrasound: IMPRESSION: 16 x 30 mm solid mass in the right submandibular area. This may represent a submandibular mass or enlarged lymph node. Recommend CT neck with contrast for further evaluation.   08/03/2018 Imaging   CT neck (at [facility]): IMPRESSION: 1. 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 There is a single ipsilateral malignant lymph node measuring 3 cm. 2. Nodular thickening along the anterior right sublingual gland, attention on follow-up PET.   09/04/2018 Procedure   US-guided bx of the R submandibular mass   09/04/2018 Pathology Results   Accession: [id number]  Lymph node, needle/core biopsy, right submandibular - SQUAMOUS CELL CARCINOMA. - SEE MICROSCOPIC DESCRIPTION.   09/11/2018 Imaging   PET: IMPRESSION: Hypermetabolism along the right base of tongue, corresponding to the patient's known primary oropharyngeal cancer.   Ipsilateral level 2 cervical nodal metastasis.   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   11/16/2018 -  Chemotherapy   The patient had dexamethasone (DECADRON) 4 MG tablet, 8 mg, Oral, Daily, 2 of 2 cycles palonosetron (ALOXI) injection 0.25 mg, 0.25 mg, Intravenous,  Once, 6 of 6 cycles Administration: 0.25 mg (11/16/2018), 0.25 mg (11/23/2018), 0.25 mg (11/30/2018), 0.25 mg (12/07/2018), 0.25 mg  (12/14/2018), 0.25 mg (12/28/2018) CARBOplatin (PARAPLATIN) 200 mg in sodium chloride 0.9 % 250 mL chemo infusion, 200 mg (100 % of original dose 202.8 mg), Intravenous,  Once, 6 of 6 cycles Dose modification: 202.8 mg (original dose 202.8 mg, Cycle 1), 260 mg (original dose 202.8 mg, Cycle 6, Reason: Provider Judgment) Administration: 200 mg (11/16/2018), 200 mg (11/23/2018), 260 mg (11/30/2018), 260 mg (12/07/2018), 260 mg (12/14/2018), 260 mg (12/28/2018)  for chemotherapy treatment.      Chemotherapy and Radiation dosed from 11/16/2018 through 01/03/2019  Site Technique Total Dose (Gy) Dose per Fx (Gy) Completed Fx Beam Energies  Head & neck: HN_BOT IMRT 70/70 2 35/35 6X        INTERVAL HISTORY:    Neil [name] is a 74 y.o. 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 He presents to clinic today for his 2nd survivorship visit having last been seen on 09/27/2019. He was last been seen on 08/14/2019 by Dr. Isidore Moos and on 04/18/2019 by Dr [name]. His neck lymphedema is stable.  He has decreased the use of his compression device which he uses for his lymphedema.  He reports having a little pain in his neck.  He continues to have oral dryness which is improved with his continued intake of water.  He is swallowing okay.  He continues to note changes in the taste of foods.  He is added apples and bananas to his diet.  He is making changes in his food choices and  is increasing his intake of vegetables.  He reports that he is eating well.  He states that his depression is better as he has excepted that he simply has new changes in his life after his diagnosis.  He continues to see doctors at the [facility].  He will be seeing Dr. Isidore Moos on 02/20/2020 after having a CT scan completed on 02/19/2020.  He will see Dr. Constance Holster in early November 2021.  He is being seen by cardiology in September 2022.  He does not know when his follow-up is with his  primary care provider.  He has not yet had new dentures made.    REVIEW OF SYSTEMS:   Visual changes:    no   Swelling of the face:    no   Swelling of the mouth:    no   Swelling of the throat:    yes   Dental problems:    yes   Skin changes at treatment site:  no   Dry mouth:     yes   Difficulty talking:    no  Thickened saliva:    no   Increased mucus:    no  Difficulty opening mouth:   no  Fatigue:     no   Pain or difficulty swallowing or chewing: Occasionally    Loss of appetite:    no   Numbness of the ear:    no  Weakness raising the arm(s):  no  Lack of lip movement:    no  Facial disfigurement:    no  Numbness of face or neck:   no  Loss of voice or impaired speech:  no  Hearing difficulty:    no  Lymphedema:     yes  Pain:      no       -Weight: has not lost weight      -Last ENT visit:    09/04/2018   -Last Rad Onc visit:   08/14/2019   -Last Dentist visit:   10/16/2018      CURRENT MEDICATIONS:    Current Outpatient Medications on File Prior to Visit  Medication Sig Dispense Refill  . co-enzyme Q-10 30 MG capsule Take 200 mg by mouth 2 (two) times daily.    Marland Kitchen ELIQUIS 5 MG TABS tablet TAKE 1 TABLET BY MOUTH TWICE A DAY 180 tablet 1  . hydrochlorothiazide (HYDRODIURIL) 25 MG tablet Take 25 mg by mouth daily.    . metoprolol succinate (TOPROL-XL) 50 MG 24 hr tablet Take 1 tablet (50 mg total) by mouth daily. Take with or immediately following a meal. 90 tablet 3  . Multiple Vitamin (MULTIVITAMIN) capsule Take 1 capsule by mouth daily.    Marland Kitchen omeprazole (PRILOSEC) 20 MG capsule Take 20 mg by mouth daily.    . Probiotic Product (PROBIOTIC PO) Take by mouth.    . tamsulosin (FLOMAX) 0.4 MG CAPS capsule Take 0.4 mg by mouth daily after supper.     No current facility-administered medications on file prior to visit.        ALLERGIES:    Allergies  Allergen Reactions  . Lisinopril Cough        ADDITIONAL REVIEW OF SYSTEMS:     Review of Systems  Constitutional: Negative for chills, diaphoresis, fever, malaise/fatigue and weight loss.  HENT: Negative for hearing loss, sore throat and tinnitus.        Dry mouth with difficulty swallowing and with lymphedema of the head neck.  Respiratory: Negative  for cough, shortness of breath and wheezing.   Cardiovascular: Negative for chest pain, palpitations, orthopnea and leg swelling.  Gastrointestinal: Negative for constipation, diarrhea, nausea and vomiting.  Genitourinary: Negative for frequency and urgency.  Musculoskeletal: Negative for back pain and neck pain.  Skin: Negative for rash.  Neurological: Negative for dizziness and weakness.  Psychiatric/Behavioral: Negative for depression. The patient is not nervous/anxious.         PHYSICAL EXAM:    Vitals:   12/26/19 1322  BP: 138/78  Pulse: 64  Resp: 18  Temp: (!) 96.6 F (35.9 C)  SpO2: 100%    Filed Weights   12/26/19 1322  Weight: 232 lb 12.8 oz (105.6 kg)    Weight Date  232.8 pounds  12/27/2019  229.7 pounds  09/27/2019  223 pounds 9.6 ounces   05/14/2019  223 pounds 9.6 ounces    04/18/2019         Pre-treatment (RT consult date):     General: Neil [name] is a 74 y.o. male who appears to be in no acute distress.? Neil [name] is unaccompanied today.    Physical Exam Constitutional:      General: He is not in acute distress.    Appearance: Normal appearance. He is not ill-appearing, toxic-appearing or diaphoretic.  HENT:     Head: Normocephalic and atraumatic.     Right Ear: Tympanic membrane, ear canal and external ear normal.     Left Ear: Tympanic membrane, ear canal and external ear normal.     Mouth/Throat:     Mouth: Mucous membranes are moist.     Pharynx: Oropharynx is clear. No oropharyngeal exudate or posterior oropharyngeal erythema.  Eyes:     General: No scleral icterus.       Right eye: No discharge.        Left eye: No discharge.     Conjunctiva/sclera:  Conjunctivae normal.  Neck:     Comments: Mild thickening noted of the skin over the anterior cervical neck which is stable. Cardiovascular:     Rate and Rhythm: Normal rate and regular rhythm.     Heart sounds: No murmur heard.  No friction rub. No gallop.   Pulmonary:     Effort: Pulmonary effort is normal. No respiratory distress.     Breath sounds: Normal breath sounds. No wheezing, rhonchi or rales.  Abdominal:     General: Abdomen is flat. There is no distension.     Palpations: Abdomen is soft. There is no mass.     Tenderness: There is no abdominal tenderness. There is no guarding or rebound.  Musculoskeletal:     Cervical back: No rigidity or tenderness.     Right lower leg: No edema.     Left lower leg: No edema.  Lymphadenopathy:     Cervical: No cervical adenopathy.  Skin:    General: Skin is warm and dry.     Coloration: Skin is not jaundiced or pale.     Findings: No bruising, erythema, lesion or rash.  Neurological:     Mental Status: He is alert.     Coordination: Coordination normal.     Gait: Gait normal.  Psychiatric:        Mood and Affect: Mood normal.        Behavior: Behavior normal.        Thought Content: Thought content normal.        Judgment: Judgment normal.        LABORATORY DATA:  The patient's complete chemistry panel returned showing:   Lab Results  Component Value Date   NA 139 12/26/2019   K 3.4 (L) 12/26/2019   CL 105 12/26/2019   CO2 25 12/26/2019   GLUCOSE 94 12/26/2019   BUN 14 12/26/2019   CREATININE 1.04 12/26/2019   ALBUMIN 3.9 12/26/2019   ALKPHOS 27 (L) 12/26/2019   ALT 8 12/26/2019   AST 18 12/26/2019   BILITOT 1.0 12/26/2019        The patient's TSH returned at:  Lab Results  Component Value Date   TSH 4.303 (H) 12/26/2019        The patient's complete blood count returned showing:      Component Value Date/Time   WBC 4.4 12/26/2019 1225   WBC 3.5 (L) 05/02/2019 1215   RBC 5.03 12/26/2019 1225    HGB 15.6 12/26/2019 1225   HCT 44.9 12/26/2019 1225   PLT 145 (L) 12/26/2019 1225   MCV 89.3 12/26/2019 1225   MCH 31.0 12/26/2019 1225   MCHC 34.7 12/26/2019 1225   RDW 13.2 12/26/2019 1225   LYMPHSABS 0.7 12/26/2019 1225   MONOABS 0.5 12/26/2019 1225   EOSABS 0.1 12/26/2019 1225   BASOSABS 0.0 12/26/2019 1225        DIAGNOSTIC IMAGING:    No results found.       ASSESSMENT & PLAN:    Neil [name] is a 74 y.o. 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 He was treated with concurrent chemoradiation which he completed treatment on 01/03/2019.  He presents to the survivorship clinic today for his 2nd survivorship visit routine follow-up having last been seen on 07/22/202.      1. 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 He has restaging scans scheduled for 02/19/2020 and will be seen in follow-up by Dr. Isidore Moos on 02/20/2020.  Prior to that he will be seen by Dr. Constance Holster in early November 2021.        2. Nutritional status: Neil [name] reports that he is currently able to consume adequate nutrition by mouth.  His weight is higher at 232.8 lbs today.      3. 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  He reports having mild difficulty with certain foods.  He has been expanding his dietary choices without any new difficulties. He is able to consume most foods and liquids without difficulty.  He was again encouraged to continue to perform swallowing exercises.  If Neil [name] requires further swallowing or speech therapy evaluation, I will be happy to place  that referral, if needed.  Currently, the patient's reported swallowing concerns are to be expected and stable.        4.  At risk for neck lymphedema:  When patients with head & neck cancers are treated with surgery and/or radiation therapy, there is an associated increased risk of neck lymphedema.  Neil [name] reports that currently he is experiencing fewer symptoms which are mild. He has a neck compression garment and reports wearing it, as directed all though he has been using  it less recently.  I encouraged Neil [name] to continue to wear the compression garment and practice the massage techniques to reduce the presence of lymphedema.  If his symptoms worsen, I would happy to place a formal referral to physical therapy for further evaluation and treatment.         5.  At risk for hypothyroidism: The thyroid gland is often affected after treatment for head & neck cancer.  Neil [name] most recent TSH was as noted below: Lab Results  Component Value Date   TSH 4.303 (H) 12/26/2019  We discussed that he will continue to have serial TSH monitoring for at least the next 5 years as part of his routine follow-up and post-cancer treatment care.  His TSH is only mildly elevated. He prefers to continue to watch this for now without being placed on levothyroxine.     6. At risk for tooth decay/dental concerns: After treatment with radiation for head & neck cancers, patients often experience dry mouth which increases their risk of dental caries. Neil [name] was encouraged to see his dentist 3-4 times per year.       DISPOSITION:    -See Dr. Constance Holster  (ENT) in November, 2021.   -Return to cancer center to see Dr. Isidore Moos on 02/20/2020.  -Return to the cancer center to see Dr. Chryl Heck in March, 2022  -Return to cancer center to see Survivorship PA in June, 2022.       A total of 45 minutes was spent in the face-to-face care of this patient, with greater than 50% of that time spent in counseling and  care-coordination.       Sandi Mealy, MHS, PA-C Physician Assistant [facility]    01/16/20   11:24 AM        NOTE: PRIMARY CARE PROVIDER   Bartholome Bill, MD   Phone: [phone]   Fax: [phone]

## 2020-01-16 NOTE — Telephone Encounter (Signed)
Scheduled appt per 11/10 sch message - mailed reminder letter with appt date and time

## 2020-02-05 DIAGNOSIS — C01 Malignant neoplasm of base of tongue: Secondary | ICD-10-CM | POA: Diagnosis not present

## 2020-02-07 ENCOUNTER — Other Ambulatory Visit: Payer: Self-pay | Admitting: Cardiovascular Disease

## 2020-02-12 ENCOUNTER — Other Ambulatory Visit: Payer: Self-pay

## 2020-02-12 DIAGNOSIS — C01 Malignant neoplasm of base of tongue: Secondary | ICD-10-CM

## 2020-02-13 DIAGNOSIS — Z23 Encounter for immunization: Secondary | ICD-10-CM | POA: Diagnosis not present

## 2020-02-15 ENCOUNTER — Ambulatory Visit: Payer: Self-pay | Admitting: Radiation Oncology

## 2020-02-18 ENCOUNTER — Other Ambulatory Visit: Payer: Self-pay

## 2020-02-18 ENCOUNTER — Ambulatory Visit
Admission: RE | Admit: 2020-02-18 | Discharge: 2020-02-18 | Disposition: A | Discharge status: Home / Self Care | Payer: Medicare Other | Source: Ambulatory Visit | Attending: Radiation Oncology | Admitting: Radiation Oncology

## 2020-02-18 DIAGNOSIS — C01 Malignant neoplasm of base of tongue: Secondary | ICD-10-CM

## 2020-02-18 LAB — BUN & CREATININE (CHCC)
BUN: 15 mg/dL (ref 8–23)
Creatinine: 1.06 mg/dL (ref 0.61–1.24)
GFR, Estimated: >60 mL/min (ref >60)

## 2020-02-19 ENCOUNTER — Encounter (HOSPITAL_COMMUNITY): Payer: Self-pay

## 2020-02-19 ENCOUNTER — Other Ambulatory Visit: Payer: Self-pay | Admitting: Cardiovascular Disease

## 2020-02-19 ENCOUNTER — Ambulatory Visit (HOSPITAL_COMMUNITY)
Admission: RE | Admit: 2020-02-19 | Discharge: 2020-02-19 | Disposition: A | Discharge status: Home / Self Care | Payer: Medicare Other | Source: Ambulatory Visit | Attending: Radiation Oncology | Admitting: Radiation Oncology

## 2020-02-19 DIAGNOSIS — C01 Malignant neoplasm of base of tongue: Secondary | ICD-10-CM | POA: Diagnosis not present

## 2020-02-19 DIAGNOSIS — Z5111 Encounter for antineoplastic chemotherapy: Secondary | ICD-10-CM | POA: Diagnosis not present

## 2020-02-19 DIAGNOSIS — I6529 Occlusion and stenosis of unspecified carotid artery: Secondary | ICD-10-CM | POA: Diagnosis not present

## 2020-02-19 DIAGNOSIS — K118 Other diseases of salivary glands: Secondary | ICD-10-CM | POA: Diagnosis not present

## 2020-02-19 HISTORY — DX: Malignant (primary) neoplasm, unspecified: C80.1

## 2020-02-19 MED ORDER — IOHEXOL 300 MG/ML  SOLN
75.0000 mL | Freq: Once | INTRAMUSCULAR | Status: AC | PRN
  Administered 2020-02-19: 75 mL via INTRAVENOUS

## 2020-02-20 ENCOUNTER — Other Ambulatory Visit: Payer: Self-pay

## 2020-02-20 ENCOUNTER — Ambulatory Visit
Admission: RE | Admit: 2020-02-20 | Discharge: 2020-02-20 | Disposition: A | Discharge status: Home / Self Care | Payer: Medicare Other | Source: Ambulatory Visit | Attending: Radiation Oncology | Admitting: Radiation Oncology

## 2020-02-20 VITALS — BP 130/88 | HR 59 | Temp 97.6°F | Resp 18 | Ht 70.0 in | Wt 230.1 lb

## 2020-02-20 DIAGNOSIS — I6529 Occlusion and stenosis of unspecified carotid artery: Secondary | ICD-10-CM | POA: Insufficient documentation

## 2020-02-20 DIAGNOSIS — Z08 Encounter for follow-up examination after completed treatment for malignant neoplasm: Secondary | ICD-10-CM | POA: Diagnosis not present

## 2020-02-20 DIAGNOSIS — M5030 Other cervical disc degeneration, unspecified cervical region: Secondary | ICD-10-CM | POA: Insufficient documentation

## 2020-02-20 DIAGNOSIS — Z79899 Other long term (current) drug therapy: Secondary | ICD-10-CM | POA: Diagnosis not present

## 2020-02-20 DIAGNOSIS — R682 Dry mouth, unspecified: Secondary | ICD-10-CM | POA: Diagnosis not present

## 2020-02-20 DIAGNOSIS — Z7901 Long term (current) use of anticoagulants: Secondary | ICD-10-CM | POA: Insufficient documentation

## 2020-02-20 DIAGNOSIS — Z923 Personal history of irradiation: Secondary | ICD-10-CM | POA: Diagnosis not present

## 2020-02-20 DIAGNOSIS — R609 Edema, unspecified: Secondary | ICD-10-CM | POA: Diagnosis not present

## 2020-02-20 DIAGNOSIS — C01 Malignant neoplasm of base of tongue: Secondary | ICD-10-CM | POA: Insufficient documentation

## 2020-02-20 NOTE — Progress Notes (Signed)
Mr. Gullett presents for follow up of radiation completed 01/03/19 to his base of tongue and bilateral neck nodes  Pain issues, if any: Patient denies, but reports occasionally his lymphedema puts pressure on his throat that is uncomfortable Using a feeding tube?: N/A; Stomach issues seem to have resolved as well now that he's not taking so many supplements of his omeprazole prescription Weight changes, if any:  Wt Readings from Last 3 Encounters:  02/20/20 230 lb 2 oz (104.4 kg)  12/26/19 232 lb 12.8 oz (105.6 kg)  11/15/19 236 lb 6.4 oz (107.2 kg)   Swallowing issues, if any: Occasionally gets strangled if he eats too quickly. He has a list of foods that he is comfortable eating, and those he knows he needs to avoid.  Smoking or chewing tobacco? None Using fluoride trays daily? N/A; He is hoping to get implants for higher quality dentures, he is interested in seeing Dr. Benson Norway for a recommendation Last ENT visit was on:  02/05/2020 Saw Dr. Izora Gala: "--Physical Exam:  Healthy-appearing gentleman in no distress. Breathing and voice are clear. No palpable neck masses. Minimal neck edema. Oral cavity and pharynx look healthy and clear. He is totally edentulous. Base of tongue looks healthy. Mucous membranes are little bit dry. Indirect exam is clear.  --Impression & Plans:  Stable, no evidence of recurrent disease. Follow-up 4 months or sooner as needed."  Other notable issues, if any: Continues to deal with dry mouth and thick saliva. Sense of taste still has not returned. Continues to deal with lymphedema, but is diligent to do his exercises and wear his compression device as needed. Reports occasional ear/jaw discomfort, and difficulty fully opening his jaw. Denies any mouth sores or ulcers.   Vitals:   02/20/20 1055  BP: 130/88  Pulse: (!) 59  Resp: 18  Temp: 97.6 F (36.4 C)  SpO2: 100%

## 2020-02-22 ENCOUNTER — Encounter: Payer: Self-pay | Admitting: Radiation Oncology

## 2020-02-22 NOTE — Progress Notes (Signed)
Radiation Oncology         [phone] ________________________________  Name: Neil [name] MRN: [id number]  Date: 02/20/2020  DOB: [birth year]  Follow-Up Visit Note in person  CC: Neil Au, MD  Neil Fischer MD  Diagnosis and Prior Radiotherapy:       ICD-10-CM   1. Cancer of base of tongue (HCC)  C01     11/16/2018 through 01/03/2019  Site Technique Total Dose (Gy) Dose per Fx (Gy) Completed Fx Beam Energies  Head & neck: HN_BOT IMRT 70/70 2 35/35 6X    CHIEF COMPLAINT:  Here for follow-up and surveillance of base of tongue cancer  Narrative:    [name] presents for follow up of radiation completed 01/03/19 to his base of tongue and bilateral neck nodes  Pain issues, if any: Patient denies, but reports occasionally his lymphedema puts pressure on his throat that is uncomfortable Using a feeding tube?: N/A; Stomach issues seem to have resolved as well now that he's not taking so many supplements of his omeprazole prescription Weight changes, if any:  Wt Readings from Last 3 Encounters:  02/20/20 230 lb 2 oz (104.4 kg)  12/26/19 232 lb 12.8 oz (105.6 kg)  11/15/19 236 lb 6.4 oz (107.2 kg)   Swallowing issues, if any: Occasionally gets strangled if he eats too quickly. He has a list of foods that he is comfortable eating, and those he knows he needs to avoid.  Smoking or chewing tobacco? None Using fluoride trays daily? N/A; He is hoping to get implants for higher quality dentures, he is interested in seeing Dr. Chales [name] for a recommendation Last ENT visit was on:  02/05/2020 Saw Dr. Serena [name]: "--Physical Exam:  Healthy-appearing gentleman in no distress. Breathing and voice are clear. No palpable neck masses. Minimal neck edema. Oral cavity and pharynx look healthy and clear. He is totally edentulous. Base of tongue looks healthy. Mucous membranes are little bit dry. Indirect exam is clear.  --Impression & Plans:  Stable, no evidence of recurrent disease.  Follow-up 4 months or sooner as needed."  Other notable issues, if any: Continues to deal with dry mouth and thick saliva. Sense of taste still has not returned. Continues to deal with lymphedema, but is diligent to do his exercises and wear his compression device as needed. Reports occasional ear/jaw discomfort, and difficulty fully opening his jaw. Denies any mouth sores or ulcers.   Recent CT of neck was reviewed today at tumor board. Shows no evidence of disease. The lymph node in his neck that we have been keeping an eye on appears to have no active disease and is very reassuring  Vitals:   02/20/20 1055  BP: 130/88  Pulse: (!) 59  Resp: 18  Temp: 97.6 F (36.4 C)  SpO2: 100%     ALLERGIES:  is allergic to lisinopril.  Meds: Current Outpatient Medications  Medication Sig Dispense Refill  . docusate sodium (COLACE) 250 MG capsule Take 250 mg by mouth daily.    Marland Kitchen ELIQUIS 5 MG TABS tablet TAKE 1 TABLET BY MOUTH TWICE A DAY 180 tablet 1  . hydrochlorothiazide (HYDRODIURIL) 25 MG tablet TAKE 1 TABLET BY MOUTH EVERY DAY 90 tablet 3  . co-enzyme Q-10 30 MG capsule Take 200 mg by mouth 2 (two) times daily. (Patient not taking: Reported on 02/20/2020)    . metoprolol succinate (TOPROL-XL) 50 MG 24 hr tablet Take 1 tablet (50 mg total) by mouth daily. Take with or immediately following a meal.  90 tablet 3  . Multiple Vitamin (MULTIVITAMIN) capsule Take 1 capsule by mouth daily. (Patient not taking: Reported on 02/20/2020)    . omeprazole (PRILOSEC) 20 MG capsule Take 20 mg by mouth daily. (Patient not taking: Reported on 02/20/2020)    . Probiotic Product (PROBIOTIC PO) Take by mouth. (Patient not taking: Reported on 02/20/2020)    . tamsulosin (FLOMAX) 0.4 MG CAPS capsule Take 0.4 mg by mouth daily after supper. (Patient not taking: Reported on 02/20/2020)     No current facility-administered medications for this encounter.    Physical Findings: The patient is in no acute distress.  Patient is alert and oriented. Wt Readings from Last 3 Encounters:  02/20/20 230 lb 2 oz (104.4 kg)  12/26/19 232 lb 12.8 oz (105.6 kg)  11/15/19 236 lb 6.4 oz (107.2 kg)    height is 5\' 10"  (1.778 m) and weight is 230 lb 2 oz (104.4 kg). His temporal temperature is 97.6 F (36.4 C). His blood pressure is 130/88 and his pulse is 59 (abnormal). His respiration is 18 and oxygen saturation is 100%. .  General: Alert and oriented, in no acute distress HEENT: Head is normocephalic. Extraocular movements are intact. Oropharynx is notable for no thrush and no sign of tumor on external exam.  He is edentulous Neck: Neck is notable for healed skin with no palpable adenopathy in the cervical or supraclavicular regions Skin: Skin in treatment fields shows satisfactory healing  Psychiatric: Judgment and insight are intact. Affect is appropriate.   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 EXAM: CT NECK WITH CONTRAST TECHNIQUE: Multidetector CT imaging of the neck was performed using the standard protocol following the bolus administration of intravenous contrast. CONTRAST:  75mL OMNIPAQUE IOHEXOL 300 MG/ML  SOLN COMPARISON:  PET-CT 08/13/2019.  Neck CT 08/02/2018. FINDINGS: Pharynx and larynx: Post treatment volume loss in the right tongue base. No recurrent mass identified within limitations of motion artifact. Salivary glands: Post radiation changes without a submandibular or parotid mass. Focal nodular soft tissue thickening and associated calcification along the right sublingual gland measuring 7 mm in transverse diameter,  decreased from the prior CT where it measured 11 mm. Thyroid: Calcified and noncalcified thyroid nodules measuring up to 11 mm. These have been evaluated on previous imaging including thyroid ultrasound. Lymph nodes: 5 mm short axis right level IIA lymph node, unchanged in size from the prior PET-CT. No evidence of new cervical lymphadenopathy. Vascular: Major vascular structures of the neck are grossly patent. Moderate carotid atherosclerosis. Limited intracranial: Unremarkable. Visualized orbits: Unremarkable. Mastoids and visualized paranasal sinuses: Minimal mucosal thickening in the maxillary sinuses. Clear mastoid air cells. Skeleton: No suspicious osseous lesion. Moderate cervical disc degeneration. Upper chest: Clear lung apices. Other: None. IMPRESSION: Post treatment changes with unchanged size of a subcentimeter right level II lymph node. No evidence of new or progressive metastatic disease in the neck. Electronically Signed   By: Sebastian Ache M.D.   On: 02/19/2020 09:15    Impression/Plan:    1) Head and Neck Cancer Status: No evidence of disease  Continue to follow with physical exams, imaging only as clinically indicated  2) Nutritional Status: No issues, PEG tube removed  3) Risk Factors: The patient has been educated about risk factors including alcohol and tobacco abuse; they understand that avoidance of alcohol and tobacco is important to prevent recurrences as well as other cancers  4) Swallowing: Functional, continue speech-language pathology exercises  5) Dental - He is edentulous and is interested in receiving dental implants to insert lower dentures. He received a  Quote from a local oral surgeon that was not realistic for him. I will ask Dr Alda Berthold for her recommendations.  6) Thyroid function: Followed regularly by medical oncology Lab Results  Component Value Date   TSH 4.303 (H) 12/26/2019    7) Other: He had substantial taste changes that continue to slowly  improve   8) Mood has improved. He is making some life changes including where he is going to live and what types of people he lives would like to surround himself with. We will continue to follow.  9) Plan for follow-up with me in 12 months. He will continue to follow with ENT and medical oncology (survivorship program) in the interim  10) We discussed measures to reduce the risk of infection during the COVID-19 pandemic.  He has been vaccinated and boosted.  On date of service, in total, I spent 30 minutes on this encounter.  This includes review of images personally, reviewing and adding to documentation, and discussing all of the issues above with the patient.  He was seen face-to-face. _____________________________________   Lonie Peak, MD

## 2020-04-04 ENCOUNTER — Telehealth: Payer: Self-pay | Admitting: Medical

## 2020-04-04 NOTE — Telephone Encounter (Signed)
Patient has been called and notified of upcoming appointment change.

## 2020-04-28 IMAGING — XA IR REPLACE G-TUBE/COLONIC TUBE
4 series · 12 of 15 positions shown · non-contrast
Comparison: none

INDICATION: 73-year-old male with a history of head neck carcinoma, with
percutaneous gastrostomy placed 11/07/2018.

[Series 2: fl - angio · 3 of 96 frames shown (1 of 3)]
[frame 14/96]
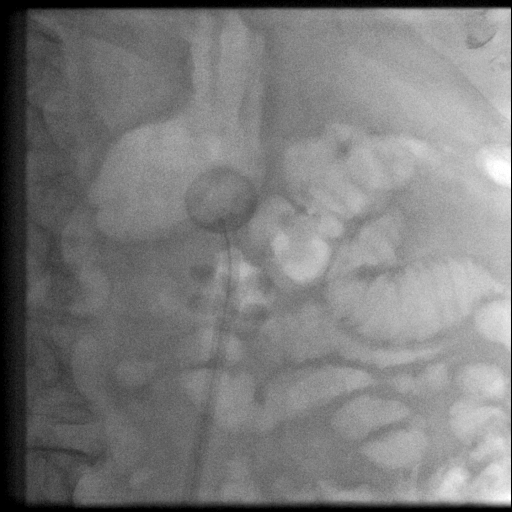
[frame 15/96]
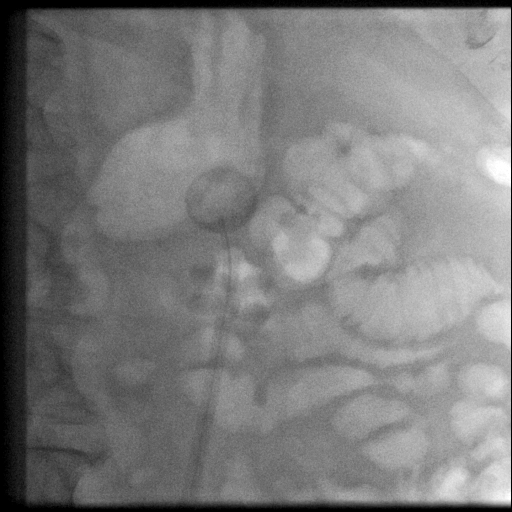
[frame 82/96]
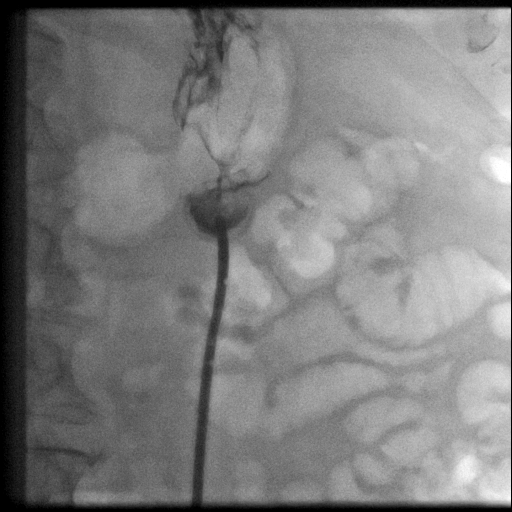

[Series 3: fl - angio · 3 of 136 frames shown (2 of 3)]
[frame 21/136]
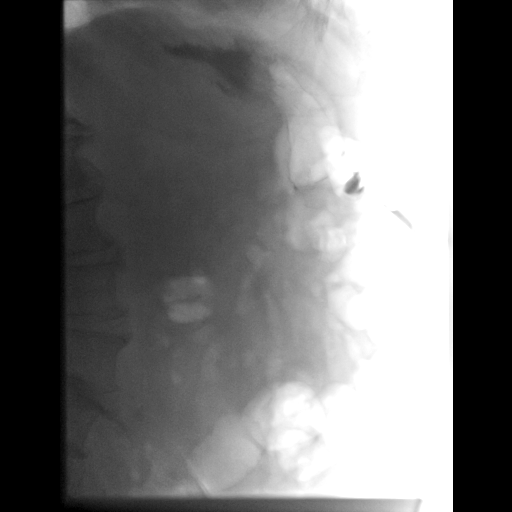
[frame 69/136]
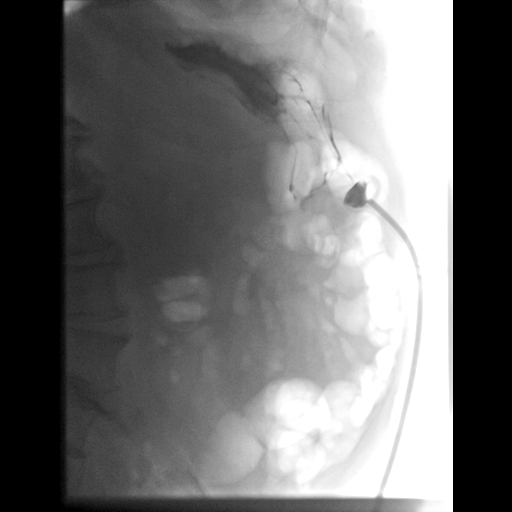
[frame 103/136]
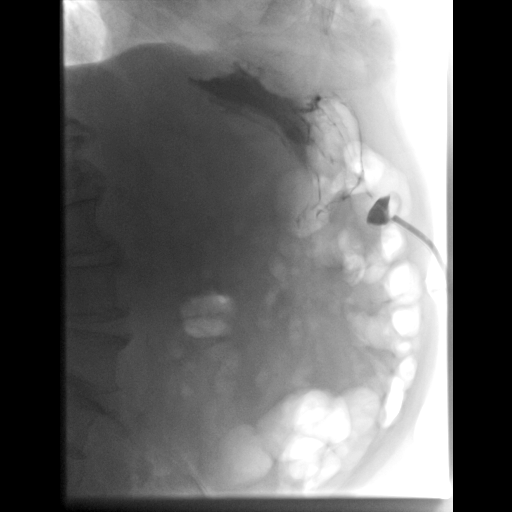

[Series 4: fl - angio · 4 of 58 frames shown (3 of 3)]
[frame 9/58]
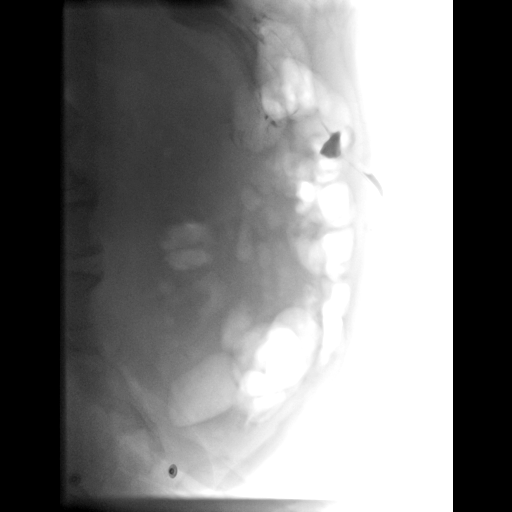
[frame 26/58]
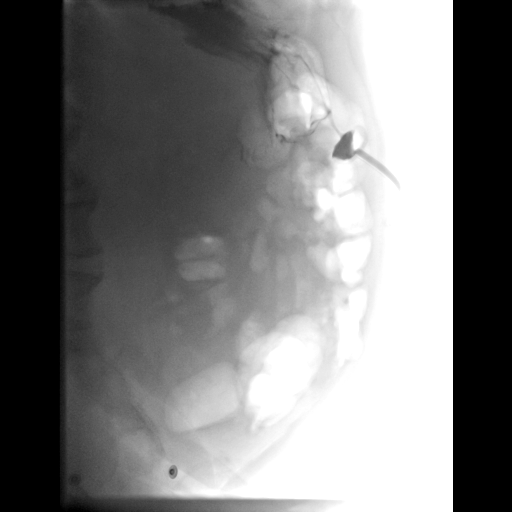
[frame 30/58]
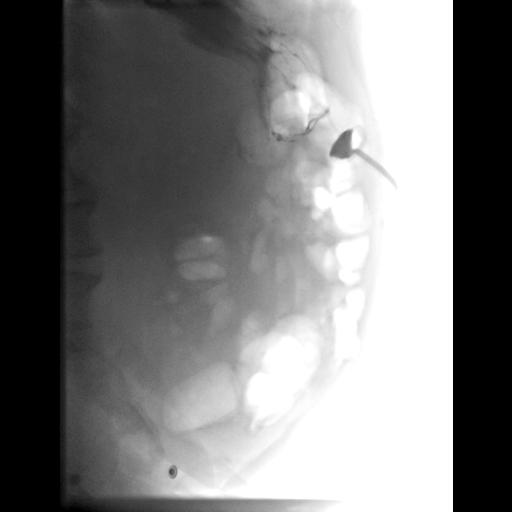
[frame 50/58]
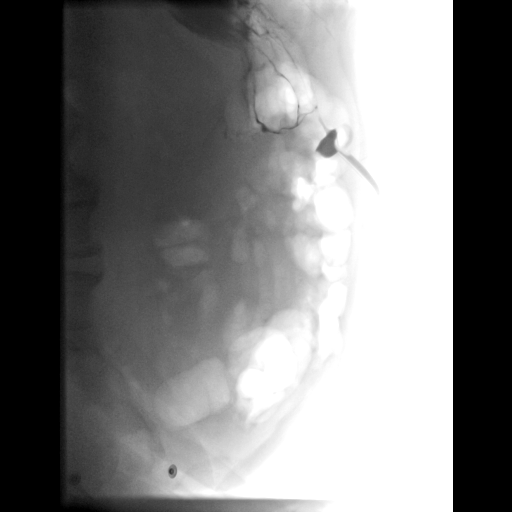

[Series 300: ld dsa body · 2 of 3 slices shown]
[im 2/3]
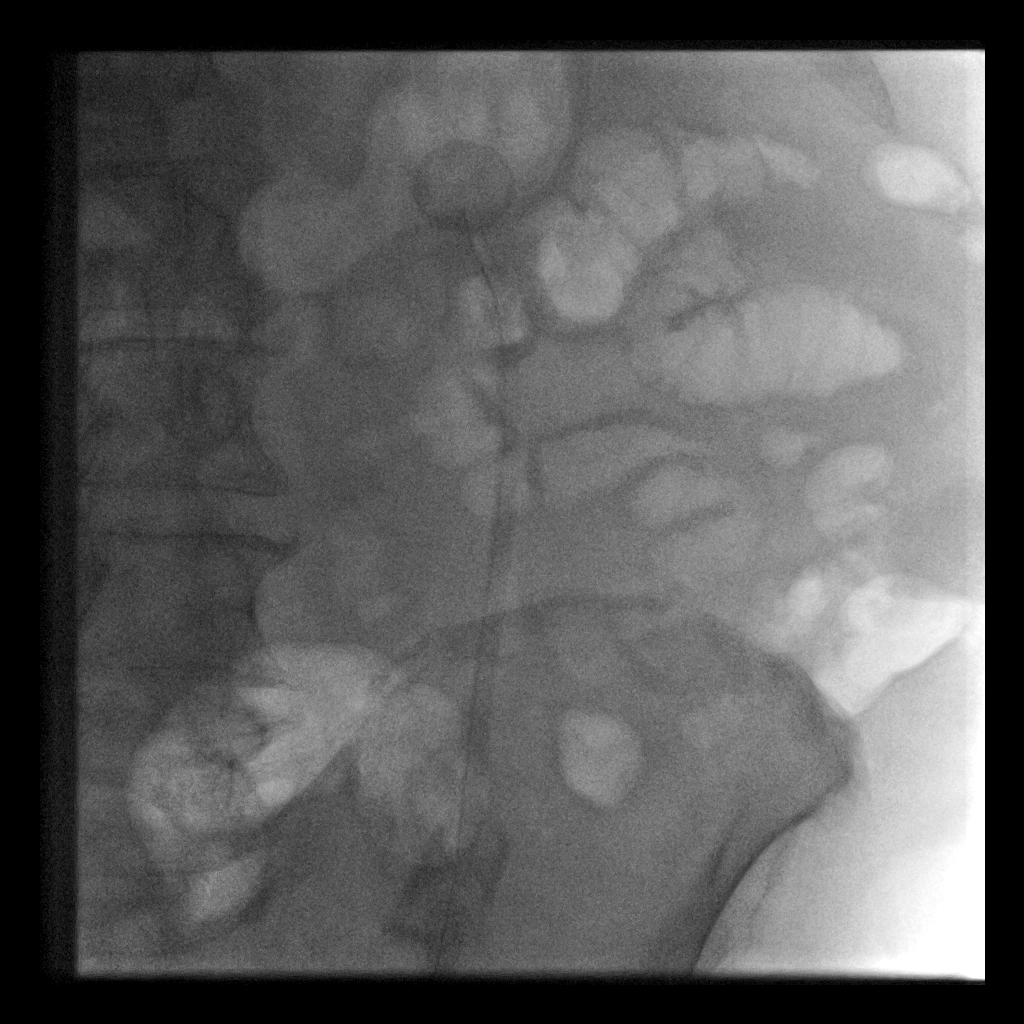
[im 3/3]
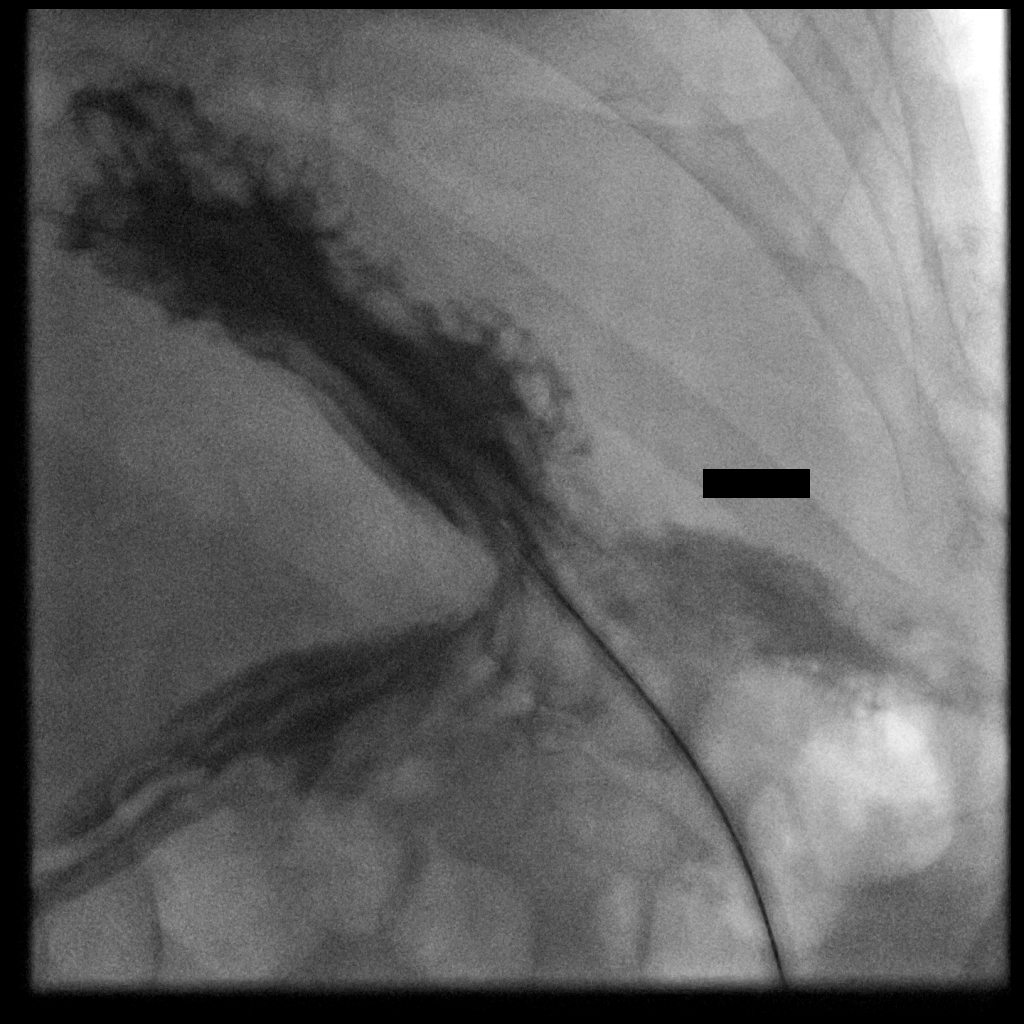

[12 of 15 positions shown; findings below may reference images not displayed]

Increasing resistance to usage has been encountered with concern for
either occluded tube or displacement.

EXAM:
RESCUE AND REPLACEMENT OF PERCUTANEOUS GASTROSTOMY

MEDICATIONS:
None

ANESTHESIA/SEDATION:
None

CONTRAST:  15 cc-administered into the gastric lumen.

FLUOROSCOPY TIME:  Fluoroscopy Time: 3 minutes 6 seconds

COMPLICATIONS:
None

PROCEDURE:
Informed written consent was obtained from the patient after a
thorough discussion of the procedural risks, benefits and
alternatives. All questions were addressed. Maximal Sterile Barrier
Technique was utilized including caps, mask, sterile gowns, sterile
gloves, sterile drape, hand hygiene and skin antiseptic. A timeout
was performed prior to the initiation of the procedure.

Initial images with injection of the tube demonstrated that the tube
has been withdrawn from the lumen with a small tract remaining
connecting the lumen of the tube with the lumen of the stomach.

Removal of the gastrostomy tube was performed with an attempt at
placing a new 20 French balloon retention gastrostomy. Resistance
was encountered.

A Kumpe the catheter was then navigated through the ostomy site into
the lumen of the stomach using navigation with contrast and
manipulation under fluoroscopy. Amplatz wire was placed into the
stomach and the Kumpe the was removed.

Eighteen French and then 20 French dilation of the tract performed.

Ultimately a new 20 French balloon retention into it percutaneous
gastrostomy was placed with a coaxial 35 cm 8 French introducer from
a sheath [DATE] cc of saline was used to inflate the balloon, and contrast
confirmed location within the stomach.

Final images were stored.

Sterile bandage was placed.

Patient tolerated the procedure well and remained hemodynamically
stable throughout.

No complications were encountered and no significant blood loss.
IMPRESSION: Status post placement of a new 20 French balloon retention
gastrostomy tube as a rescue for a displaced pull-through
gastrostomy tube.

## 2020-04-30 ENCOUNTER — Other Ambulatory Visit: Payer: Self-pay | Admitting: Cardiovascular Disease

## 2020-05-21 ENCOUNTER — Other Ambulatory Visit: Payer: Self-pay

## 2020-05-21 ENCOUNTER — Encounter: Payer: Self-pay | Admitting: Hematology and Oncology

## 2020-05-21 ENCOUNTER — Inpatient Hospital Stay: Payer: Medicare HMO | Attending: Hematology and Oncology | Admitting: Hematology and Oncology

## 2020-05-21 VITALS — BP 163/93 | HR 63 | Temp 98.2°F | Resp 18 | Ht 70.0 in | Wt 232.5 lb

## 2020-05-21 DIAGNOSIS — Z85810 Personal history of malignant neoplasm of tongue: Secondary | ICD-10-CM | POA: Diagnosis present

## 2020-05-21 DIAGNOSIS — C01 Malignant neoplasm of base of tongue: Secondary | ICD-10-CM

## 2020-05-21 NOTE — Progress Notes (Signed)
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  R cervical LN substantially reduced in size to 0.8cm w/ SUV 4.7 (prev 1.7cm w/ SUV 5.8).  There was no new or metastatic disease.  -Imaging in June showed improving residual 5 mm short axis right level 2 cervical LN -Imaging in December again with post treatment changes with unchanged size of a subcentimeter right level II lN.  No evidence of new or progressive metastatic disease in the neck. ROS today with no concerns for progression PE today with no concerns of progression, ENT and neck exam without any concerns for recurrence. I encouraged him to FU with survivorship clinic in June as scheduled. At this point, we could consider another imaging in Dec 2022 unless he has new clinical concerns. He is agreeable to the plan.  -No orders of the defined types were placed in this encounter.   The total time spent in the encounter was 30 minutes, including face-to-face time with the patient, review of various tests results, order additional studies/medications, documentation, and coordination of care plan.   All questions were answered. The patient knows to call the clinic with any problems, questions  or concerns. No barriers to learning was detected.  Return in 3 months for labs and clinic follow-up with H&N survivorship clinic.   Neil Pike, MD 3/16/20221:40 PM  CHIEF COMPLAINT:     Oncology History  Cancer of base of tongue ([place])  07/29/2018 Imaging   Neck ultrasound: IMPRESSION: 16 x 30 mm solid mass in the right submandibular area. This may represent a submandibular mass or enlarged lymph node. Recommend CT neck with contrast for further evaluation.   08/03/2018 Imaging   CT neck (at [facility]): IMPRESSION: 1. 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 There is a single ipsilateral malignant lymph node measuring 3 cm. 2. Nodular thickening along the anterior right sublingual gland, attention on follow-up PET.   09/04/2018 Procedure   US-guided bx of the R submandibular mass   09/04/2018 Pathology Results   Accession: [id number]  Lymph node, needle/core biopsy, right submandibular - SQUAMOUS CELL CARCINOMA. - SEE MICROSCOPIC DESCRIPTION.   09/11/2018 Imaging   PET: IMPRESSION: Hypermetabolism along the right base of tongue, corresponding to the patient's known primary oropharyngeal cancer.   Ipsilateral level 2 cervical nodal metastasis.   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   11/16/2018 -  Chemotherapy   The patient had dexamethasone (DECADRON) 4 MG tablet, 8 mg, Oral, Daily, 2 of 2 cycles palonosetron (ALOXI) injection 0.25 mg, 0.25 mg, Intravenous,  Once, 6 of 6 cycles Administration: 0.25 mg (11/16/2018), 0.25 mg (11/23/2018), 0.25 mg (11/30/2018), 0.25 mg (12/07/2018), 0.25 mg (12/14/2018), 0.25 mg (12/28/2018) CARBOplatin (  PARAPLATIN) 200 mg in sodium chloride 0.9 % 250 mL chemo infusion, 200 mg (100 % of original dose 202.8 mg), Intravenous,  Once, 6 of 6 cycles Dose modification: 202.8 mg  (original dose 202.8 mg, Cycle 1), 260 mg (original dose 202.8 mg, Cycle 6, Reason: Provider Judgment) Administration: 200 mg (11/16/2018), 200 mg (11/23/2018), 260 mg (11/30/2018), 260 mg (12/07/2018), 260 mg (12/14/2018), 260 mg (12/28/2018)  for chemotherapy treatment.      INTERVAL HISTORY: Neil [name] returns clinic for follow-up of squamous cell carcinoma of the right base of the tongue s/p definitive chemoradiation.    He is doing remarkably well. No complaints. He has some baseline lymphedema in his neck. He is able to swallow everything, no problems. He is worried about his abdominal obesity. He was wondering if we can engage him with a dentist to help with dentures. Some mild SOB with exertion Mechanical fall 2 months ago without much trauma. Rest of the pertinent 10 point ROS reviewed and negative.  REVIEW OF SYSTEMS:    Constitutional: ( - ) fevers, ( - )  chills , ( - ) night sweats Eyes: ( - ) blurriness of vision, ( - ) double vision, ( - ) watery eyes Ears, nose, mouth, throat, and face: ( - ) mucositis, ( - ) sore throat Respiratory: ( - ) cough, ( - ) dyspnea, ( - ) wheezes Cardiovascular: ( - ) palpitation, ( - ) chest discomfort, ( - ) lower extremity swelling Gastrointestinal:  ( - ) nausea, ( - ) heartburn, ( - ) change in bowel habits Skin: ( - ) abnormal skin rashes Lymphatics: ( - ) new lymphadenopathy, ( - ) easy bruising Neurological: ( - ) numbness, ( - ) tingling, ( - ) new weaknesses Behavioral/Psych: ( - ) mood change, ( - ) new changes  All other systems were reviewed with the patient and are negative.   I have reviewed the past medical history, past surgical history, social history and family history with the patient and they are unchanged from previous note.  ALLERGIES:  is allergic to lisinopril.  MEDICATIONS:  Current Outpatient Medications  Medication Sig Dispense Refill  . co-enzyme Q-10 30 MG capsule Take 200 mg by mouth 2 (two) times daily.  (Patient not taking: Reported on 02/20/2020)    . docusate sodium (COLACE) 250 MG capsule Take 250 mg by mouth daily.    Marland Kitchen ELIQUIS 5 MG TABS tablet TAKE 1 TABLET BY MOUTH TWICE A DAY 180 tablet 1  . hydrochlorothiazide (HYDRODIURIL) 25 MG tablet TAKE 1 TABLET BY MOUTH EVERY DAY 90 tablet 3  . metoprolol succinate (TOPROL-XL) 50 MG 24 hr tablet TAKE 1 TABLET (50 MG TOTAL) BY MOUTH DAILY. TAKE WITH OR IMMEDIATELY FOLLOWING A MEAL. 90 tablet 3  . Multiple Vitamin (MULTIVITAMIN) capsule Take 1 capsule by mouth daily. (Patient not taking: Reported on 02/20/2020)    . omeprazole (PRILOSEC) 20 MG capsule Take 20 mg by mouth daily. (Patient not taking: Reported on 02/20/2020)    . Probiotic Product (PROBIOTIC PO) Take by mouth. (Patient not taking: Reported on 02/20/2020)    . tamsulosin (FLOMAX) 0.4 MG CAPS capsule Take 0.4 mg by mouth daily after supper. (Patient not taking: Reported on 02/20/2020)     No current facility-administered medications for this visit.    PHYSICAL EXAMINATION: ECOG PERFORMANCE STATUS: 1 - Symptomatic but completely ambulatory  There were no vitals filed for this visit. There is no height or weight on file to  calculate BMI.  There were no vitals filed for this visit.  GENERAL: alert, no distress and comfortable SKIN: skin color, texture, turgor are normal, no rashes or significant lesions EYES: conjunctiva are pink and non-injected, sclera clear OROPHARYNX: no exudate, no erythema; lips, buccal mucosa, and tongue normal  NECK: supple, non-tender LYMPH:  no palpable lymphadenopathy in the cervical region LUNGS: clear to auscultation with normal breathing effort HEART: regular rate & rhythm and no murmurs and no lower extremity edema ABDOMEN: soft, non-tender, non-distended, normal bowel sounds Musculoskeletal: no cyanosis of digits and no clubbing  PSYCH: alert & oriented x 3, fluent speech  LABORATORY DATA:  I have reviewed the data as listed    Component Value  Date/Time   NA 139 12/26/2019 1225   K 3.4 (L) 12/26/2019 1225   CL 105 12/26/2019 1225   CO2 25 12/26/2019 1225   GLUCOSE 94 12/26/2019 1225   BUN 15 02/18/2020 1103   CREATININE 1.06 02/18/2020 1103   CALCIUM 9.4 12/26/2019 1225   PROT 6.6 12/26/2019 1225   ALBUMIN 3.9 12/26/2019 1225   AST 18 12/26/2019 1225   ALT 8 12/26/2019 1225   ALKPHOS 27 (L) 12/26/2019 1225   BILITOT 1.0 12/26/2019 1225   GFRNONAA >60 02/18/2020 1103   GFRAA >60 09/27/2019 1103    No results found for: SPEP, UPEP  Lab Results  Component Value Date   WBC 4.4 12/26/2019   NEUTROABS 3.0 12/26/2019   HGB 15.6 12/26/2019   HCT 44.9 12/26/2019   MCV 89.3 12/26/2019   PLT 145 (L) 12/26/2019      Chemistry      Component Value Date/Time   NA 139 12/26/2019 1225   K 3.4 (L) 12/26/2019 1225   CL 105 12/26/2019 1225   CO2 25 12/26/2019 1225   BUN 15 02/18/2020 1103   CREATININE 1.06 02/18/2020 1103      Component Value Date/Time   CALCIUM 9.4 12/26/2019 1225   ALKPHOS 27 (L) 12/26/2019 1225   AST 18 12/26/2019 1225   ALT 8 12/26/2019 1225   BILITOT 1.0 12/26/2019 1225     Reviewed pertinent imaging  RADIOGRAPHIC STUDIES: I have personally reviewed the radiological images as listed below and agreed with the findings in the report. No results found.   Neil Pike MD

## 2020-06-11 ENCOUNTER — Other Ambulatory Visit: Payer: Self-pay

## 2020-06-11 ENCOUNTER — Ambulatory Visit (INDEPENDENT_AMBULATORY_CARE_PROVIDER_SITE_OTHER): Payer: Self-pay | Admitting: Dentistry

## 2020-06-11 VITALS — BP 139/83 | HR 49 | Temp 98.3°F

## 2020-06-11 DIAGNOSIS — R432 Parageusia: Secondary | ICD-10-CM

## 2020-06-11 DIAGNOSIS — Z9189 Other specified personal risk factors, not elsewhere classified: Secondary | ICD-10-CM

## 2020-06-11 DIAGNOSIS — Z923 Personal history of irradiation: Secondary | ICD-10-CM

## 2020-06-11 DIAGNOSIS — Z972 Presence of dental prosthetic device (complete) (partial): Secondary | ICD-10-CM

## 2020-06-11 DIAGNOSIS — C01 Malignant neoplasm of base of tongue: Secondary | ICD-10-CM

## 2020-06-11 DIAGNOSIS — K0889 Other specified disorders of teeth and supporting structures: Secondary | ICD-10-CM

## 2020-06-11 DIAGNOSIS — K08109 Complete loss of teeth, unspecified cause, unspecified class: Secondary | ICD-10-CM

## 2020-06-11 DIAGNOSIS — K117 Disturbances of salivary secretion: Secondary | ICD-10-CM

## 2020-06-11 DIAGNOSIS — Z0120 Encounter for dental examination and cleaning without abnormal findings: Secondary | ICD-10-CM

## 2020-06-11 DIAGNOSIS — Z7901 Long term (current) use of anticoagulants: Secondary | ICD-10-CM

## 2020-06-11 NOTE — Progress Notes (Signed)
Department of Dental Medicine     NEW PATIENT EXAM  Service Date:   06/11/2020  Patient Name:  Neil [name] Date of Birth:   December 07, 1945 Medical Record Number: [id number]  Referring Provider:              Rachel Moulds, MD  S  ASSESSMENT & PLAN   >> Soft tissue: Xerostomia- thicker saliva and less in quantity. Otherwise healthy, pink mucosa. >> Hard tissue: All edentulous anatomy is adequate and there is very little alveolar ridge loss. >> Other: The patient does have ill-fitting maxillary complete and mandibular partial dentures. Recommend fabrication of new upper and lower dentures.   After a discussion and exploring various treatment options, the following treatment plan was finalized:  1. Upper and lower complete denture fabrication >>  Plan to submit treatment plan to patient's dental insurance for prior approval.  Once pre-authorization is received, we will proceed with denture fabrication.  2. Recall interval: 1 year periodic exams  NEXT VISIT: Initial impressions for C/C dentures    06/11/2020      COMPREHENSIVE EXAM NOTE   COVID 19 SCREENING: The patient denies symptoms concerning for COVID-19 infection including fever, chills, cough, or newly developed shortness of breath.   HISTORY OF PRESENT ILLNESS: >> Neil [name] is a very pleasant 75 y.o. 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   DENTAL HISTORY: > The patient is a patient of record at the hospital dental clinic.  He was seen in 2020 prior to starting radiation by Dr. Kristin Bruins who referred him to an oral surgeon, Dr. Dutch Quint, who extracted multiple teeth. Today the patient would like to discuss options for complete dentures.  He has an upper complete and lower partial denture that were made by [facility] several years ago.  After his radiation therapy, his weight loss and  missing lower teeth have caused his dentures not to fit well anymore and he is having difficulty chewing healthy foods. He currently denies any dental/orofacial pain or sensitivity. >> From Dr. Remonia Richter progress note on 05/21/2020:  Continues to deal with dry mouth and thick saliva. Sense of taste still has not returned. Continues to deal with lymphedema, but is diligent to do his exercises and wear his compression device as needed. Reports occasional ear/jaw discomfort, and difficulty fully opening his jaw. Denies any mouth sores or ulcers.    CHIEF COMPLAINT:  Here for a comprehensive dental exam.   Patient Active Problem List   Diagnosis Date Noted  . Port-A-Cath in place 11/22/2018  . CKD (chronic kidney disease), stage III (HCC) 10/18/2018  . Cancer of base of tongue (HCC) 10/16/2018   Past Medical History:  Diagnosis Date  . Atrial fibrillation (HCC)   . base of throat ca dx'd 07/2017  . Chronic anticoagulation    On Eliquis  . GERD (gastroesophageal reflux disease)   . History of radiation therapy 11/16/18- 01/03/19   Head and Neck, Base of Tongue 35 fractions of 2 Gy each for a total of 70 Gy.   Marland Kitchen Hypertension   . Joint pain    Past Surgical History:  Procedure Laterality Date  . HEMORROIDECTOMY  2004  . HERNIA REPAIR  1960  . IR GASTROSTOMY TUBE MOD SED  11/07/2018  . IR GASTROSTOMY TUBE REMOVAL  05/02/2019  . IR IMAGING GUIDED PORT INSERTION  11/07/2018  . IR  PATIENT EVAL TECH 0-60 MINS  03/30/2019  . IR REMOVAL TUN ACCESS W/ PORT W/O FL MOD SED  05/02/2019  . IR REPLC GASTRO/COLONIC TUBE PERCUT W/FLUORO  01/01/2019  . IR US GUIDANCE  09/04/2018  . IR US GUIDE BX ASP/DRAIN  09/04/2018  . MOUTH BIOPSY  09/04/2018   right jaw  . right testical removed    . TONSILLECTOMY     Allergies  Allergen Reactions  . Lisinopril Cough   Current Outpatient Medications  Medication Sig Dispense Refill  . co-enzyme Q-10 30 MG capsule Take 200 mg by mouth 2 (two) times daily. (Patient not  taking: Reported on 02/20/2020)    . docusate sodium (COLACE) 250 MG capsule Take 250 mg by mouth daily.    Marland Kitchen ELIQUIS 5 MG TABS tablet TAKE 1 TABLET BY MOUTH TWICE A DAY 180 tablet 1  . hydrochlorothiazide (HYDRODIURIL) 25 MG tablet TAKE 1 TABLET BY MOUTH EVERY DAY 90 tablet 3  . metoprolol succinate (TOPROL-XL) 50 MG 24 hr tablet TAKE 1 TABLET (50 MG TOTAL) BY MOUTH DAILY. TAKE WITH OR IMMEDIATELY FOLLOWING A MEAL. 90 tablet 3  . Multiple Vitamin (MULTIVITAMIN) capsule Take 1 capsule by mouth daily. (Patient not taking: Reported on 02/20/2020)    . omeprazole (PRILOSEC) 20 MG capsule Take 20 mg by mouth daily. (Patient not taking: Reported on 02/20/2020)    . Probiotic Product (PROBIOTIC PO) Take by mouth. (Patient not taking: Reported on 02/20/2020)    . tamsulosin (FLOMAX) 0.4 MG CAPS capsule Take 0.4 mg by mouth daily after supper. (Patient not taking: Reported on 02/20/2020)     No current facility-administered medications for this visit.    LABS: Lab Results  Component Value Date   WBC 4.4 12/26/2019   HGB 15.6 12/26/2019   HCT 44.9 12/26/2019   MCV 89.3 12/26/2019   PLT 145 (L) 12/26/2019      Component Value Date/Time   NA 139 12/26/2019 1225   K 3.4 (L) 12/26/2019 1225   CL 105 12/26/2019 1225   CO2 25 12/26/2019 1225   GLUCOSE 94 12/26/2019 1225   BUN 15 02/18/2020 1103   CREATININE 1.06 02/18/2020 1103   CALCIUM 9.4 12/26/2019 1225   GFRNONAA >60 02/18/2020 1103   GFRAA >60 09/27/2019 1103   Lab Results  Component Value Date   INR 1.0 05/02/2019   INR 1.1 11/07/2018   INR 0.9 09/04/2018   No results found for: PTT  Social History   Socioeconomic History  . Marital status: Divorced    Spouse name: Not on file  . Number of children: 1  . Years of education: Not on file  . Highest education level: Not on file  Occupational History  . Not on file  Tobacco Use  . Smoking status: Former Smoker    Packs/day: 1.00    Years: 5.00    Pack years: 5.00    Quit  date: 1970    Years since quitting: 52.2  . Smokeless tobacco: Never Used  Vaping Use  . Vaping Use: Never used  Substance and Sexual Activity  . Alcohol use: Not Currently    Comment: none since recent diagnosis.   . Drug use: No  . Sexual activity: Not on file  Other Topics Concern  . Not on file  Social History Narrative   Patient is divorced with 1 daughter living in [place].   Patient with history of smoking approximately 1 pack/day for 5 years.  Patient quit in 1970.  Patient has never used smokeless tobacco.   Patient with a history of drinking wine occasionally.  None since June 2020.   Patient denies use of illicit drugs.   Social Determinants of Health   Financial Resource Strain: Not on file  Food Insecurity: Not on file  Transportation Needs: Not on file  Physical Activity: Not on file  Stress: Not on file  Social Connections: Not on file  Intimate Partner Violence: Not on file   Family History  Problem Relation Age of Onset  . Alzheimer's disease Mother      REVIEW OF SYSTEMS: Reviewed with the patient as per HPI. PSYCH: Patient denies having dental phobia.   VITAL SIGNS: BP 139/83 (BP Location: Right Arm)   Pulse (!) 49   Temp 98.3 F (36.8 C) (Oral)    PHYSICAL EXAM: >> General:  Well-developed, comfortable and in no apparent distress. >> Neurological:  Alert and oriented to person, place and  time. >> Extraoral:  Head size, head shape, facial symmetry, skin, complexion, pigmentation, conjunctiva, oral labia, parotid gland, submandibular gland, thyroid, cervical and preauricular lymph nodes, submandibular and submental lymph nodes, tonsillar and occipital lymph nodes and supraclavicular lymph nodes WNL.  No swelling or lymphadenopathy.  TMJ asymptomatic without clicks or crepitations.  >> The patient reports occasional pain on opening underneath his mandible on the right side. >> Intraoral:  Soft tissues appear well-perfused and mucous membranes  moist.  FOM and vestibules soft and not raised. Oral cavity without mass or lesion. No signs of infection, parulis, sinus tract, edema or erythema evident upon exam.   >> Quality of saliva is thick and quantity significantly less   DENTAL EXAM: Hard tissue exam completed and charted.  >> Dentition:  Completely edentulous maxilla and mandible. >> The patient is maintaining good oral hygiene.   >> Removable/Fixed Prosthodontics: Existing upper complete denture and lower partial denture.  Upper denture is adequate, but does not fit as well since the patient went through radiation therapy d/t weight loss/gain and xerostomia.  Lower partial does not fit since the patient had the rest of his teeth extracted prior to starting radiation.  >> Occlusion: Unable to assess.   RADIOGRAPHIC EXAM: PAN exposed and interpreted today.  >> Condyles seated bilaterally in fossas.  No evidence of abnormal pathology.  All visualized osseous structures appear WNL.   ASSESSMENT:  1. 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 New patient dental exam 3. Long-term (current) use of anticoagulants (on Eliquis) 4. Complete loss of teeth 5. Xerostomia 6. Dysgeusia 7. Ill-fitting prosthesis 8. Postoperative bleeding risk   PLAN AND RECOMMENDATIONS: >  I discussed the risks, benefits, and complications of various scenarios with the patient in relationship to their medical and dental conditions.  I explained all significant findings of the dental consultation with the patient including how his dry mouth can make wearing complete dentures more difficult than normal as well as poor retention with a lower complete denture in comparison to an upper complete denture and the recommended care including making a new maxillary and mandibular complete removable prostheses which will fit his mouth better following his radiation therapy and hopefully allow him to speak and chew comfortably again. The patient  verbalized understanding of all findings, discussion, and recommendations. >>  We then discussed various treatment options to include no treatment, replacement of missing teeth with new upper and lower denture fabrication as indicated and returning for adjustments when needed and annual recall visits.  The  patient verbalized understanding of all options, and currently wishes to proceed with making a new set of dentures and returning for routine care as recommended.  Explained to the patient that his insurance may require a prior approval before we can begin, so we will schedule his next visit as soon as this is confirmed.  He is agreeable to the plan. >>>  Plan to discuss all findings and recommendations with medical team and coordinate future care as needed.   <> The patient tolerated today's visit well.  All questions and concerns were addressed and answered, and the patient departed in stable condition.  Pavan Bring B. Chales Salmon, D.M.D.

## 2020-07-06 NOTE — Progress Notes (Signed)
Cardiology Office Note:   Date:  07/07/2020  NAME:  Neil [name]    MRN: [id number] DOB:  Jul 12, 1945   PCP:  Verlon Au, MD  Cardiologist:  Reatha Harps, MD   Referring MD: Verlon Au, MD   Chief Complaint  Patient presents with  . Follow-up   History of Present Illness:   Neil [name] is a 75 y.o. male with a hx of permanent Afib, HTN, SCC of tongue who presents for follow-up.  He reports he is doing well.  Denies any chest pain or shortness of breath.  He does report low energy.  He has gained 20 pounds.  He reports this is self-inflicted.  He has plans to improve his diet as well as to get more active.  He recently removed [place] a new apartment in [place].  [place]  He hopes to get more active.  Overall doing well today in office.  Denies any bleeding with Eliquis.  Heart rate well controlled on metoprolol.  Problem List 1. Atrial fibrillation, permanent  -failed multiple cardioversions -CHADSVASC=3  2. HTN 3. SCC tongue  Past Medical History: Past Medical History:  Diagnosis Date  . Atrial fibrillation (HCC)   . base of throat ca dx'd 07/2017  . Chronic anticoagulation    On Eliquis  . GERD (gastroesophageal reflux disease)   . History of radiation therapy 11/16/18- 01/03/19   Head and Neck, Base of Tongue 35 fractions of 2 Gy each for a total of 70 Gy.   Marland Kitchen Hypertension   . Joint pain     Past Surgical History: Past Surgical History:  Procedure Laterality Date  . HEMORROIDECTOMY  2004  . HERNIA REPAIR  1960  . IR GASTROSTOMY TUBE MOD SED  11/07/2018  . IR GASTROSTOMY TUBE REMOVAL  05/02/2019  . IR IMAGING GUIDED PORT INSERTION  11/07/2018  . IR PATIENT EVAL TECH 0-60 MINS  03/30/2019  . IR REMOVAL TUN ACCESS W/ PORT W/O FL MOD SED  05/02/2019  . IR REPLC GASTRO/COLONIC TUBE PERCUT W/FLUORO  01/01/2019  . IR US GUIDANCE  09/04/2018  . IR US GUIDE BX ASP/DRAIN  09/04/2018  . MOUTH BIOPSY  09/04/2018    right jaw  . right testical removed    . TONSILLECTOMY      Current Medications: Current Meds  Medication Sig  . co-enzyme Q-10 30 MG capsule Take 200 mg by mouth 2 (two) times daily.  Marland Kitchen docusate sodium (COLACE) 250 MG capsule Take 250 mg by mouth daily.  Marland Kitchen ELIQUIS 5 MG TABS tablet TAKE 1 TABLET BY MOUTH TWICE A DAY  . hydrochlorothiazide (HYDRODIURIL) 25 MG tablet TAKE 1 TABLET BY MOUTH EVERY DAY  . metoprolol succinate (TOPROL-XL) 50 MG 24 hr tablet TAKE 1 TABLET (50 MG TOTAL) BY MOUTH DAILY. TAKE WITH OR IMMEDIATELY FOLLOWING A MEAL.  . Multiple Vitamin (MULTIVITAMIN) capsule Take 1 capsule by mouth daily.  . multivitamin-iron-minerals-folic acid (CENTRUM) chewable tablet Chew 1 tablet by mouth daily.  . Omega-3 Fatty Acids (FISH OIL) 1000 MG CAPS Take by mouth.  Marland Kitchen omeprazole (PRILOSEC) 20 MG capsule Take 20 mg by mouth daily.  . Probiotic Product (PROBIOTIC PO) Take by mouth.  . tamsulosin (FLOMAX) 0.4 MG CAPS capsule Take 0.4 mg by mouth daily after supper.     Allergies:    Lisinopril   Social History: Social History   Socioeconomic History  . Marital status: Divorced    Spouse  name: Not on file  . Number of children: 1  . Years of education: Not on file  . Highest education level: Not on file  Occupational History  . Not on file  Tobacco Use  . Smoking status: Former Smoker    Packs/day: 1.00    Years: 5.00    Pack years: 5.00    Quit date: 1970    Years since quitting: 52.3  . Smokeless tobacco: Never Used  Vaping Use  . Vaping Use: Never used  Substance and Sexual Activity  . Alcohol use: Not Currently    Comment: none since recent diagnosis.   . Drug use: No  . Sexual activity: Not on file  Other Topics Concern  . Not on file  Social History Narrative   Patient is divorced with 1 daughter living in [place].   Patient with history of smoking approximately 1 pack/day for 5 years.  Patient quit in 1970.   Patient has never used smokeless tobacco.    Patient with a history of drinking wine occasionally.  None since June 2020.   Patient denies use of illicit drugs.   Social Determinants of Health   Financial Resource Strain: Not on file  Food Insecurity: Not on file  Transportation Needs: Not on file  Physical Activity: Not on file  Stress: Not on file  Social Connections: Not on file     Family History: The patient's family history includes Alzheimer's disease in his mother.  ROS:   All other ROS reviewed and negative. Pertinent positives noted in the HPI.     EKGs/Labs/Other Studies Reviewed:   The following studies were personally reviewed by me today:  TTE 04/05/2019 1. Left ventricular ejection fraction, by visual estimation, is 55 to  60%. The left ventricle has normal function. There is mildly increased  left ventricular hypertrophy.  2. Left ventricular diastolic function could not be evaluated.  3. The left ventricle has no regional wall motion abnormalities.  4. Global right ventricle has normal systolic function.The right  ventricular size is normal.  5. Left atrial size was moderately dilated.  6. Right atrial size was mildly dilated.  7. The mitral valve is normal in structure. Mild mitral valve  regurgitation. No evidence of mitral stenosis.  8. The tricuspid valve is normal in structure. Tricuspid valve  regurgitation is mild.  9. The aortic valve is tricuspid. Aortic valve regurgitation is mild.  Mild aortic valve sclerosis without stenosis.  10. The pulmonic valve was grossly normal. Pulmonic valve regurgitation is  trivial.  11. Aortic dilatation noted.  12. There is mild dilatation of the ascending aorta measuring 40 mm.  13. The inferior vena cava is normal in size with greater than 50%  respiratory variability, suggesting right atrial pressure of 3 mmHg.  14. Normal LV function; mild LVH; mildly dilated ascending aorta; mild AI  and MR; biatrial enlargement.   Recent Labs: 12/26/2019:  ALT 8; Hemoglobin 15.6; Platelet Count 145; Potassium 3.4; Sodium 139; TSH 4.303 02/18/2020: BUN 15; Creatinine 1.06   Recent Lipid Panel No results found for: CHOL, TRIG, HDL, CHOLHDL, VLDL, LDLCALC, LDLDIRECT  Physical Exam:   VS:  BP 140/70   Pulse 97   Ht 5\' 10"  (1.778 m)   Wt 240 lb 12.8 oz (109.2 kg)   SpO2 96%   BMI 34.55 kg/m    Wt Readings from Last 3 Encounters:  07/07/20 240 lb 12.8 oz (109.2 kg)  05/21/20 232 lb 8 oz (105.5 kg)  02/20/20 230 lb 2 oz (104.4 kg)    General: Well nourished, well developed, in no acute distress Head: Atraumatic, normal size  Eyes: PEERLA, EOMI  Neck: Supple, no JVD Endocrine: No thryomegaly Cardiac: Normal S1, S2; irregular rhythm, no murmurs rubs or gallops Lungs: Clear to auscultation bilaterally, no wheezing, rhonchi or rales  Abd: Soft, nontender, no hepatomegaly  Ext: No edema, pulses 2+ Musculoskeletal: No deformities, BUE and BLE strength normal and equal Skin: Warm and dry, no rashes   Neuro: Alert and oriented to person, place, time, and situation, CNII-XII grossly intact, no focal deficits  Psych: Normal mood and affect   ASSESSMENT:   Neil [name] is a 75 y.o. male who presents for the following: 1. Permanent atrial fibrillation (HCC)   2. Essential hypertension     PLAN:   1. Permanent atrial fibrillation (HCC) -Failed cardioversions in the past.  We have decided on a rate control strategy.  We will continue metoprolol succinate for rate control.  He is on Eliquis.  No significant bleeding.  He is really without symptoms.  2. Essential hypertension -Well-controlled today.  No change in medications.   Disposition: Return in about 6 months (around 01/07/2021).  Medication Adjustments/Labs and Tests Ordered: Current medicines are reviewed at length with the patient today.  Concerns regarding medicines are outlined above.  No orders of the defined types were placed in this encounter.  No orders of the defined types  were placed in this encounter.   Patient Instructions  Medication Instructions:  The current medical regimen is effective;  continue present plan and medications.  *If you need a refill on your cardiac medications before your next appointment, please call your pharmacy*   Follow-Up: At [facility], you and your health needs are our priority.  As part of our continuing mission to provide you with exceptional heart care, we have created designated Provider Care Teams.  These Care Teams include your primary Cardiologist (physician) and Advanced Practice Providers (APPs -  Physician Assistants and Nurse Practitioners) who all work together to provide you with the care you need, when you need it.  We recommend signing up for the patient portal called "MyChart".  Sign up information is provided on this After Visit Summary.  MyChart is used to connect with patients for Virtual Visits (Telemedicine).  Patients are able to view lab/test results, encounter notes, upcoming appointments, etc.  Non-urgent messages can be sent to your provider as well.   To learn more about what you can do with MyChart, go to [email or web address].    Your next appointment:   6 month(s)  The format for your next appointment:   In Person  Provider:   Lennie Odor, MD       Time Spent with Patient: I have spent a total of 25 minutes with patient reviewing hospital notes, telemetry, EKGs, labs and examining the patient as well as establishing an assessment and plan that was discussed with the patient.  > 50% of time was spent in direct patient care.  Signed, Lenna Gilford. Flora Lipps, MD, [facility]  [facility]  [address] [place], [place] [address] [phone]  07/07/2020 11:47 AM

## 2020-07-07 ENCOUNTER — Encounter: Payer: Self-pay | Admitting: Cardiovascular Disease

## 2020-07-07 ENCOUNTER — Ambulatory Visit (INDEPENDENT_AMBULATORY_CARE_PROVIDER_SITE_OTHER): Payer: Medicare HMO | Admitting: Cardiovascular Disease

## 2020-07-07 ENCOUNTER — Other Ambulatory Visit: Payer: Self-pay

## 2020-07-07 VITALS — BP 140/70 | HR 97 | Ht 70.0 in | Wt 240.8 lb

## 2020-07-07 DIAGNOSIS — I4821 Permanent atrial fibrillation: Secondary | ICD-10-CM

## 2020-07-07 DIAGNOSIS — I10 Essential (primary) hypertension: Secondary | ICD-10-CM | POA: Diagnosis not present

## 2020-07-07 NOTE — Patient Instructions (Signed)
Medication Instructions:  The current medical regimen is effective;  continue present plan and medications.  *If you need a refill on your cardiac medications before your next appointment, please call your pharmacy*   Follow-Up: At [facility], you and your health needs are our priority.  As part of our continuing mission to provide you with exceptional heart care, we have created designated Provider Care Teams.  These Care Teams include your primary Cardiologist (physician) and Advanced Practice Providers (APPs -  Physician Assistants and Nurse Practitioners) who all work together to provide you with the care you need, when you need it.  We recommend signing up for the patient portal called "MyChart".  Sign up information is provided on this After Visit Summary.  MyChart is used to connect with patients for Virtual Visits (Telemedicine).  Patients are able to view lab/test results, encounter notes, upcoming appointments, etc.  Non-urgent messages can be sent to your provider as well.   To learn more about what you can do with MyChart, go to [email or web address].    Your next appointment:   6 month(s)  The format for your next appointment:   In Person  Provider:   [place] O'Neal, MD      

## 2020-07-29 ENCOUNTER — Telehealth: Payer: Self-pay | Admitting: Hematology and Oncology

## 2020-07-29 NOTE — Telephone Encounter (Signed)
R/s per prov req, 11/16 los, pt aware

## 2020-07-30 ENCOUNTER — Encounter (HOSPITAL_COMMUNITY): Payer: Self-pay | Admitting: Dentistry

## 2020-07-30 ENCOUNTER — Other Ambulatory Visit: Payer: Self-pay

## 2020-07-30 ENCOUNTER — Ambulatory Visit (INDEPENDENT_AMBULATORY_CARE_PROVIDER_SITE_OTHER): Payer: Dental | Admitting: Dentistry

## 2020-07-30 DIAGNOSIS — K08109 Complete loss of teeth, unspecified cause, unspecified class: Secondary | ICD-10-CM

## 2020-07-30 NOTE — Progress Notes (Signed)
Department of Dental Medicine    INITIAL IMPRESSIONS  Service Date:   07/30/2020  Patient Name:   Neil [name] Date of Birth:   03-25-1945 Medical Record Number: [id number]  TODAY'S VISIT   Procedures:   . Initial impressions taken for C/C dentures.  Plan: . Next visit:  Border molding and final impressions  PROGRESS NOTE:   COVID-19 SCREENING:  The patient denies symptoms concerning for COVID-19 infection including fever, chills, cough, or newly developed shortness of breath.   HISTORY OF PRESENT ILLNESS: . Neil [name] presents today for complete upper and lower dentures preliminary impressions.  . Medical and dental history reviewed with the patient.  No changes reported.   CHIEF COMPLAINT:   . Here for a routine dental appointment.   Patient Active Problem List   Diagnosis Date Noted  . Port-A-Cath in place 11/22/2018  . CKD (chronic kidney disease), stage III (HCC) 10/18/2018  . Cancer of base of tongue (HCC) 10/16/2018   Past Medical History:  Diagnosis Date  . Atrial fibrillation (HCC)   . base of throat ca dx'd 07/2017  . Chronic anticoagulation    On Eliquis  . GERD (gastroesophageal reflux disease)   . History of radiation therapy 11/16/18- 01/03/19   Head and Neck, Base of Tongue 35 fractions of 2 Gy each for a total of 70 Gy.   Marland Kitchen Hypertension   . Joint pain    Current Outpatient Medications  Medication Sig Dispense Refill  . co-enzyme Q-10 30 MG capsule Take 200 mg by mouth 2 (two) times daily.    Marland Kitchen docusate sodium (COLACE) 250 MG capsule Take 250 mg by mouth daily.    Marland Kitchen ELIQUIS 5 MG TABS tablet TAKE 1 TABLET BY MOUTH TWICE A DAY 180 tablet 1  . hydrochlorothiazide (HYDRODIURIL) 25 MG tablet TAKE 1 TABLET BY MOUTH EVERY DAY 90 tablet 3  . metoprolol succinate (TOPROL-XL) 50 MG 24 hr tablet TAKE 1 TABLET (50 MG TOTAL) BY MOUTH DAILY. TAKE WITH OR IMMEDIATELY FOLLOWING A MEAL. 90 tablet 3  . Multiple Vitamin (MULTIVITAMIN) capsule Take 1 capsule by  mouth daily.    . multivitamin-iron-minerals-folic acid (CENTRUM) chewable tablet Chew 1 tablet by mouth daily.    . Omega-3 Fatty Acids (FISH OIL) 1000 MG CAPS Take by mouth.    Marland Kitchen omeprazole (PRILOSEC) 20 MG capsule Take 20 mg by mouth daily.    . Probiotic Product (PROBIOTIC PO) Take by mouth.    . tamsulosin (FLOMAX) 0.4 MG CAPS capsule Take 0.4 mg by mouth daily after supper.     No current facility-administered medications for this visit.   Allergies  Allergen Reactions  . Lisinopril Cough    VITALS: BP 134/76 (BP Location: Right Arm)   Pulse (!) 48   Temp 98 F (36.7 C) (Oral)    ASSESSMENT: . Completely edentulous   PROCEDURES: . Maxillary and mandibular complete dentures initial impressions. 1. Upper and lower impressions taken in alginate.  2. Impressions poured up with Type IV Microstone in lab. 3. Fabrication of upper and lower custom trays.   PLAN: . Next visit: Border molding and final impressions with custom trays.  o All questions and concerns were invited and addressed.  The patient tolerated today's visit well and departed in stable condition.  Ayat Drenning B. Chales Salmon, D.M.D.

## 2020-08-06 ENCOUNTER — Encounter (HOSPITAL_COMMUNITY): Payer: Self-pay | Admitting: Dentistry

## 2020-08-06 ENCOUNTER — Ambulatory Visit (INDEPENDENT_AMBULATORY_CARE_PROVIDER_SITE_OTHER): Payer: Dental | Admitting: Dentistry

## 2020-08-06 ENCOUNTER — Other Ambulatory Visit: Payer: Self-pay

## 2020-08-06 DIAGNOSIS — K08109 Complete loss of teeth, unspecified cause, unspecified class: Secondary | ICD-10-CM

## 2020-08-06 NOTE — Progress Notes (Signed)
Department of Dental Medicine    FINAL IMPRESSIONS  Service Date:   08/06/2020  Patient Name:   Neil [name] Date of Birth:   02-29-1946 Medical Record Number: [id number]  TODAY'S VISIT   Procedures:   . Final impressions for C/C dentures  Plan: . Next visit:  Bite registration/MMR and tooth shade selection  PROGRESS NOTE:   COVID-19 SCREENING:  The patient denies symptoms concerning for COVID-19 infection including fever, chills, cough, or newly developed shortness of breath.   HISTORY OF PRESENT ILLNESS: . [name] presents today for border molding and final impressions for maxillary and mandibular complete dentures.  . Medical and dental history reviewed with the patient.  No changes reported.   CHIEF COMPLAINT: . Here for a routine dental appointment.  Patient with no complaints.   Patient Active Problem List   Diagnosis Date Noted  . Port-A-Cath in place 11/22/2018  . CKD (chronic kidney disease), stage III (HCC) 10/18/2018  . Cancer of base of tongue (HCC) 10/16/2018   Past Medical History:  Diagnosis Date  . Atrial fibrillation (HCC)   . base of throat ca dx'd 07/2017  . Chronic anticoagulation    On Eliquis  . GERD (gastroesophageal reflux disease)   . History of radiation therapy 11/16/18- 01/03/19   Head and Neck, Base of Tongue 35 fractions of 2 Gy each for a total of 70 Gy.   Marland Kitchen Hypertension   . Joint pain    Current Outpatient Medications  Medication Sig Dispense Refill  . co-enzyme Q-10 30 MG capsule Take 200 mg by mouth 2 (two) times daily.    Marland Kitchen docusate sodium (COLACE) 250 MG capsule Take 250 mg by mouth daily.    Marland Kitchen ELIQUIS 5 MG TABS tablet TAKE 1 TABLET BY MOUTH TWICE A DAY 180 tablet 1  . hydrochlorothiazide (HYDRODIURIL) 25 MG tablet TAKE 1 TABLET BY MOUTH EVERY DAY 90 tablet 3  . metoprolol succinate (TOPROL-XL) 50 MG 24 hr tablet TAKE 1 TABLET (50 MG TOTAL) BY MOUTH DAILY. TAKE WITH OR IMMEDIATELY FOLLOWING A MEAL. 90 tablet 3  . Multiple  Vitamin (MULTIVITAMIN) capsule Take 1 capsule by mouth daily.    . multivitamin-iron-minerals-folic acid (CENTRUM) chewable tablet Chew 1 tablet by mouth daily.    . Omega-3 Fatty Acids (FISH OIL) 1000 MG CAPS Take by mouth.    Marland Kitchen omeprazole (PRILOSEC) 20 MG capsule Take 20 mg by mouth daily.    . Probiotic Product (PROBIOTIC PO) Take by mouth.    . tamsulosin (FLOMAX) 0.4 MG CAPS capsule Take 0.4 mg by mouth daily after supper.     No current facility-administered medications for this visit.   Allergies  Allergen Reactions  . Lisinopril Cough    VITALS: BP 123/70 (BP Location: Right Arm)   Pulse (!) 49   Temp 98.4 F (36.9 C) (Oral)    ASSESSMENT: . Completely edentulous.   PROCEDURES: .  Border molding and final impressions for maxillary and mandibular complete dentures.  1. Customs trays were seated and adjusted as needed.   2. Edentulous regions and posterior palatal seal were border molded with heavy body Genie PVS material.  Excess material trimmed away. 3. Reseated custom trays to verify border molding and proper seating. 4. PVS adhesive was added to the intaglio surfaces of custom trays.   5. Medium Body Genie material was added to the custom trays.  Trays were subsequently seated and allowed to set.   6. Removed custom trays and evaluated for accuracy.  Final impressions appear to have captured all important anatomy and thus deemed adequate.    PLAN . Next visit:  Jaw relations/bite registration and tooth shade selection for upper and lower dentures  o All questions and concerns were invited and addressed.  The patient tolerated today's visit well and departed in stable condition.   Shaheen Mende B. Chales Salmon, D.M.D.

## 2020-08-21 ENCOUNTER — Other Ambulatory Visit: Payer: Self-pay

## 2020-08-21 ENCOUNTER — Telehealth: Payer: Self-pay | Admitting: Hematology and Oncology

## 2020-08-21 ENCOUNTER — Encounter: Payer: Self-pay | Admitting: Hematology and Oncology

## 2020-08-21 ENCOUNTER — Inpatient Hospital Stay: Payer: Medicare HMO | Attending: Hematology and Oncology | Admitting: Hematology and Oncology

## 2020-08-21 ENCOUNTER — Ambulatory Visit: Payer: Medicare Other | Admitting: Medical

## 2020-08-21 VITALS — BP 143/83 | HR 61 | Temp 97.6°F | Resp 19 | Ht 70.0 in | Wt 239.0 lb

## 2020-08-21 DIAGNOSIS — Z85810 Personal history of malignant neoplasm of tongue: Secondary | ICD-10-CM | POA: Insufficient documentation

## 2020-08-21 DIAGNOSIS — Z79899 Other long term (current) drug therapy: Secondary | ICD-10-CM | POA: Insufficient documentation

## 2020-08-21 DIAGNOSIS — Z9221 Personal history of antineoplastic chemotherapy: Secondary | ICD-10-CM | POA: Diagnosis not present

## 2020-08-21 DIAGNOSIS — Z7901 Long term (current) use of anticoagulants: Secondary | ICD-10-CM | POA: Diagnosis not present

## 2020-08-21 DIAGNOSIS — Z923 Personal history of irradiation: Secondary | ICD-10-CM | POA: Diagnosis not present

## 2020-08-21 DIAGNOSIS — C01 Malignant neoplasm of base of tongue: Secondary | ICD-10-CM | POA: Diagnosis not present

## 2020-08-21 NOTE — Telephone Encounter (Signed)
Scheduled per los. Patient decilined printout

## 2020-08-21 NOTE — Progress Notes (Signed)
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  R cervical LN substantially reduced in size to 0.8cm w/ SUV 4.7 (prev 1.7cm w/ SUV 5.8).  There was no new or metastatic disease.  - Imaging in June showed improving residual 5 mm short axis right level 2 cervical LN - Imaging in last December again with post treatment changes with unchanged size of a subcentimeter right level II lN.  No evidence of new or progressive metastatic disease in the neck. ROS today with no concerns for progression PE unremarkable CT imaging neck ordered Dec 2022. He can return to clinic in Dec to see Dr Isidore Moos,  We will see him back in June 2023 Labs to be done. Ordered CBC, CMP and TSH  - Orders Placed This Encounter  Procedures   CT Soft Tissue Neck W Contrast    Standing Status:   Future    Standing Expiration Date:   08/21/2021    Order Specific Question:   If indicated for the ordered procedure, I authorize the administration of contrast media per Radiology protocol    Answer:   Yes    Order Specific Question:   Preferred imaging location?    Answer:   [facility]   CBC with Differential/Platelet    Standing Status:   Standing    Number of  Occurrences:   22    Standing Expiration Date:   08/21/2021   CMP ([place] only)    Standing Status:   Future    Standing Expiration Date:   08/21/2021   TSH    Standing Status:   Standing    Number of Occurrences:   22    Standing Expiration Date:   08/21/2021     [name], MD 6/16/20223:00 PM  CHIEF COMPLAINT:     Oncology History  Cancer of base of tongue ([place])  07/29/2018 Imaging   Neck ultrasound: IMPRESSION: 16 x 30 mm solid mass in the right submandibular area. This may represent a submandibular mass or enlarged lymph node. Recommend CT neck with contrast for further evaluation.   08/03/2018 Imaging   CT neck (at [facility]): IMPRESSION: 1. 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 There is a single ipsilateral malignant lymph node measuring 3 cm. 2. Nodular thickening along the anterior right sublingual gland, attention on follow-up PET.   09/04/2018 Procedure   US-guided bx of the R submandibular mass   09/04/2018 Pathology Results   Accession: [id number]  Lymph node, needle/core biopsy, right submandibular - SQUAMOUS CELL CARCINOMA. - SEE MICROSCOPIC DESCRIPTION.   09/11/2018 Imaging   PET: IMPRESSION: Hypermetabolism along the right base of tongue, corresponding to the patient's known primary oropharyngeal cancer.   Ipsilateral level 2 cervical nodal metastasis.  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   11/16/2018 -  Chemotherapy   The patient had dexamethasone (DECADRON) 4 MG tablet, 8 mg, Oral, Daily, 2 of 2 cycles palonosetron (ALOXI) injection 0.25 mg, 0.25 mg, Intravenous,  Once, 6 of 6 cycles Administration: 0.25 mg (11/16/2018), 0.25 mg (11/23/2018), 0.25 mg (11/30/2018), 0.25 mg (12/07/2018), 0.25 mg (12/14/2018), 0.25 mg (12/28/2018) CARBOplatin (PARAPLATIN) 200 mg in sodium  chloride 0.9 % 250 mL chemo infusion, 200 mg (100 % of original dose 202.8 mg), Intravenous,  Once, 6 of 6 cycles Dose modification: 202.8 mg (original dose 202.8 mg, Cycle 1), 260 mg (original dose 202.8 mg, Cycle 6, Reason: Provider Judgment) Administration: 200 mg (11/16/2018), 200 mg (11/23/2018), 260 mg (11/30/2018), 260 mg (12/07/2018), 260 mg (12/14/2018), 260 mg (12/28/2018)  for chemotherapy treatment.      INTERVAL HISTORY:  Neil [name] returns clinic for follow-up of squamous cell carcinoma of the right base of the tongue s/p definitive chemoradiation.    Neil [name] is here for FU. Since last visit he is doing well. Mild fatigue, mild dry mouth otherwise no new problems. He tries to drink or sip on water to help dry mouth He saw Dr Benson Norway and working on his dentures. No change in breathing, bowel habits or urinary habits No new neurological complaints. Rest of the pertinent 10 point ROS reviewed and neg.  REVIEW OF SYSTEMS:    Constitutional: ( - ) fevers, ( - )  chills , ( - ) night sweats Eyes: ( - ) blurriness of vision, ( - ) double vision, ( - ) watery eyes Ears, nose, mouth, throat, and face: ( - ) mucositis, ( - ) sore throat Respiratory: ( - ) cough, ( - ) dyspnea, ( - ) wheezes Cardiovascular: ( - ) palpitation, ( - ) chest discomfort, ( - ) lower extremity swelling Gastrointestinal:  ( - ) nausea, ( - ) heartburn, ( - ) change in bowel habits Skin: ( - ) abnormal skin rashes Lymphatics: ( - ) new lymphadenopathy, ( - ) easy bruising Neurological: ( - ) numbness, ( - ) tingling, ( - ) new weaknesses Behavioral/Psych: ( - ) mood change, ( - ) new changes  All other systems were reviewed with the patient and are negative.   I have reviewed the past medical history, past surgical history, social history and family history with the patient and they are unchanged from previous note.  ALLERGIES:  is allergic to lisinopril.  MEDICATIONS:  Current Outpatient Medications   Medication Sig Dispense Refill   co-enzyme Q-10 30 MG capsule Take 200 mg by mouth 2 (two) times daily.     ELIQUIS 5 MG TABS tablet TAKE 1 TABLET BY MOUTH TWICE A DAY 180 tablet 1   Ginkgo Biloba (GINKGO PO) Take 1 tablet by mouth daily.     hydrochlorothiazide (HYDRODIURIL) 25 MG tablet TAKE 1 TABLET BY MOUTH EVERY DAY 90 tablet 3   Multiple Vitamin (MULTIVITAMIN) capsule Take 1 capsule by mouth daily.     Omega-3 Fatty Acids (FISH OIL) 1000 MG CAPS Take by mouth.     omeprazole (PRILOSEC) 20 MG capsule Take 20 mg by mouth daily.     Probiotic Product (PROBIOTIC PO) Take by mouth.     tamsulosin (FLOMAX) 0.4 MG CAPS capsule Take 0.4 mg by mouth daily after supper.     docusate sodium (COLACE) 250 MG capsule Take 250 mg by mouth daily. (  Patient not taking: Reported on 08/21/2020)     metoprolol succinate (TOPROL-XL) 50 MG 24 hr tablet TAKE 1 TABLET (50 MG TOTAL) BY MOUTH DAILY. TAKE WITH OR IMMEDIATELY FOLLOWING A MEAL. 90 tablet 3   multivitamin-iron-minerals-folic acid (CENTRUM) chewable tablet Chew 1 tablet by mouth daily.     No current facility-administered medications for this visit.    PHYSICAL EXAMINATION: ECOG PERFORMANCE STATUS: 1 - Symptomatic but completely ambulatory  Today's Vitals   08/21/20 1240  BP: (!) 143/83  Pulse: 61  Resp: 19  Temp: 97.6 F (36.4 C)  TempSrc: Tympanic  SpO2: 98%  Weight: 239 lb (108.4 kg)  Height: 5\' 10"  (1.778 m)  PainSc: 2    Body mass index is 34.29 kg/m.  Filed Weights   08/21/20 1240  Weight: 239 lb (108.4 kg)   Physical Exam Constitutional:      Appearance: Normal appearance. He is normal weight.  HENT:     Head: Normocephalic and atraumatic.  Cardiovascular:     Rate and Rhythm: Normal rate and regular rhythm.  Pulmonary:     Effort: Pulmonary effort is normal.     Breath sounds: Normal breath sounds.  Musculoskeletal:     Cervical back: No rigidity or tenderness.  Neurological:     Mental Status: He is alert.      LABORATORY DATA:  I have reviewed the data as listed    Component Value Date/Time   NA 139 12/26/2019 1225   K 3.4 (L) 12/26/2019 1225   CL 105 12/26/2019 1225   CO2 25 12/26/2019 1225   GLUCOSE 94 12/26/2019 1225   BUN 15 02/18/2020 1103   CREATININE 1.06 02/18/2020 1103   CALCIUM 9.4 12/26/2019 1225   PROT 6.6 12/26/2019 1225   ALBUMIN 3.9 12/26/2019 1225   AST 18 12/26/2019 1225   ALT 8 12/26/2019 1225   ALKPHOS 27 (L) 12/26/2019 1225   BILITOT 1.0 12/26/2019 1225   GFRNONAA >60 02/18/2020 1103   GFRAA >60 09/27/2019 1103    No results found for: SPEP, UPEP  Lab Results  Component Value Date   WBC 4.4 12/26/2019   NEUTROABS 3.0 12/26/2019   HGB 15.6 12/26/2019   HCT 44.9 12/26/2019   MCV 89.3 12/26/2019   PLT 145 (L) 12/26/2019      Chemistry      Component Value Date/Time   NA 139 12/26/2019 1225   K 3.4 (L) 12/26/2019 1225   CL 105 12/26/2019 1225   CO2 25 12/26/2019 1225   BUN 15 02/18/2020 1103   CREATININE 1.06 02/18/2020 1103      Component Value Date/Time   CALCIUM 9.4 12/26/2019 1225   ALKPHOS 27 (L) 12/26/2019 1225   AST 18 12/26/2019 1225   ALT 8 12/26/2019 1225   BILITOT 1.0 12/26/2019 1225     Reviewed pertinent imaging  RADIOGRAPHIC STUDIES: I have personally reviewed the radiological images as listed below and agreed with the findings in the report. No results found.   [name] MD

## 2020-08-21 NOTE — Telephone Encounter (Signed)
Scheduled  appointment per 06/16 sch msg. Patient is aware. 

## 2020-08-22 ENCOUNTER — Encounter (HOSPITAL_COMMUNITY): Payer: Medicare HMO | Admitting: Dentistry

## 2020-08-22 ENCOUNTER — Ambulatory Visit: Payer: Medicare Other | Admitting: Medical

## 2020-08-25 ENCOUNTER — Other Ambulatory Visit: Payer: Self-pay

## 2020-08-25 ENCOUNTER — Inpatient Hospital Stay: Payer: Medicare HMO

## 2020-08-25 DIAGNOSIS — C01 Malignant neoplasm of base of tongue: Secondary | ICD-10-CM

## 2020-08-25 DIAGNOSIS — Z85810 Personal history of malignant neoplasm of tongue: Secondary | ICD-10-CM | POA: Diagnosis not present

## 2020-08-25 LAB — CBC WITH DIFFERENTIAL/PLATELET
Abs Immature Granulocytes: 0.02 10*3/uL (ref 0.00–0.07)
Basophils Absolute: 0.1 10*3/uL (ref 0.0–0.1)
Basophils Relative: 1 %
Eosinophils Absolute: 0.1 10*3/uL (ref 0.0–0.5)
Eosinophils Relative: 2 %
HCT: 43.8 % (ref 39.0–52.0)
Hemoglobin: 15.2 g/dL (ref 13.0–17.0)
Immature Granulocytes: 1 %
Lymphocytes Relative: 21 %
Lymphs Abs: 0.9 10*3/uL (ref 0.7–4.0)
MCH: 30.5 pg (ref 26.0–34.0)
MCHC: 34.7 g/dL (ref 30.0–36.0)
MCV: 88 fL (ref 80.0–100.0)
Monocytes Absolute: 0.4 10*3/uL (ref 0.1–1.0)
Monocytes Relative: 10 %
Neutro Abs: 2.7 10*3/uL (ref 1.7–7.7)
Neutrophils Relative %: 65 %
Platelets: 148 10*3/uL — ABNORMAL LOW (ref 150–400)
RBC: 4.98 MIL/uL (ref 4.22–5.81)
RDW: 13.6 % (ref 11.5–15.5)
WBC: 4.2 10*3/uL (ref 4.0–10.5)
nRBC: 0 % (ref 0.0–0.2)

## 2020-08-25 LAB — CMP (CANCER CENTER ONLY)
ALT: 11 U/L (ref 0–44)
AST: 20 U/L (ref 15–41)
Albumin: 4.1 g/dL (ref 3.5–5.0)
Alkaline Phosphatase: 27 U/L — ABNORMAL LOW (ref 38–126)
Anion gap: 11 (ref 5–15)
BUN: 19 mg/dL (ref 8–23)
CO2: 27 mmol/L (ref 22–32)
Calcium: 9 mg/dL (ref 8.9–10.3)
Chloride: 102 mmol/L (ref 98–111)
Creatinine: 1.05 mg/dL (ref 0.61–1.24)
GFR, Estimated: >60 mL/min (ref >60)
Glucose, Bld: 92 mg/dL (ref 70–99)
Potassium: 3.3 mmol/L — ABNORMAL LOW (ref 3.5–5.1)
Sodium: 140 mmol/L (ref 135–145)
Total Bilirubin: 0.8 mg/dL (ref 0.3–1.2)
Total Protein: 6.9 g/dL (ref 6.5–8.1)

## 2020-08-25 LAB — TSH: TSH: 6.884 u[IU]/mL — ABNORMAL HIGH (ref 0.350–4.500)

## 2020-08-26 ENCOUNTER — Other Ambulatory Visit: Payer: Medicare HMO

## 2020-08-27 ENCOUNTER — Other Ambulatory Visit: Payer: Self-pay

## 2020-08-27 ENCOUNTER — Other Ambulatory Visit: Payer: Self-pay | Admitting: Hematology and Oncology

## 2020-08-27 DIAGNOSIS — C01 Malignant neoplasm of base of tongue: Secondary | ICD-10-CM

## 2020-08-27 DIAGNOSIS — Z85810 Personal history of malignant neoplasm of tongue: Secondary | ICD-10-CM | POA: Diagnosis not present

## 2020-08-27 LAB — T4, FREE: Free T4: 0.84 ng/dL (ref 0.61–1.12)

## 2020-08-27 NOTE — Progress Notes (Signed)
t4

## 2020-09-02 ENCOUNTER — Encounter (HOSPITAL_COMMUNITY): Payer: Medicare HMO | Admitting: Dentistry

## 2020-09-03 ENCOUNTER — Other Ambulatory Visit: Payer: Self-pay

## 2020-09-03 ENCOUNTER — Ambulatory Visit (INDEPENDENT_AMBULATORY_CARE_PROVIDER_SITE_OTHER): Payer: Dental | Admitting: Dentistry

## 2020-09-03 ENCOUNTER — Encounter (HOSPITAL_COMMUNITY): Payer: Self-pay | Admitting: Dentistry

## 2020-09-03 DIAGNOSIS — K08109 Complete loss of teeth, unspecified cause, unspecified class: Secondary | ICD-10-CM

## 2020-09-03 NOTE — Progress Notes (Signed)
Department of Dental Medicine    JAW RELATIONS  Service Date:   09/03/2020  Patient Name:   Neil [name] Date of Birth:   [birth year] Medical Record Number: [id number]        TODAY'S VISIT:   Procedures:   C/C dentures-  MMR and tooth shade selection  Plan: Next visit:  Wax try-in and gingival shade selection       PROGRESS NOTE:   COVID-19 SCREENING:  The patient denies symptoms concerning for COVID-19 infection including fever, chills, cough, or newly developed shortness of breath.   HISTORY OF PRESENT ILLNESS: Neil [name] presents today for upper and lower complete dentures jaw relations appointment (MMR).  Medical and dental history reviewed with the patient.  No changes reported.   CHIEF COMPLAINT:   Patient with no complaints.  Here for a routine dental appointment.   Patient Active Problem List   Diagnosis Date Noted   Port-A-Cath in place 11/22/2018   CKD (chronic kidney disease), stage III (HCC) 10/18/2018   Cancer of base of tongue (HCC) 10/16/2018   Past Medical History:  Diagnosis Date   Atrial fibrillation (HCC)    base of throat ca dx'd 07/2017   Chronic anticoagulation    On Eliquis   GERD (gastroesophageal reflux disease)    History of radiation therapy 11/16/18- 01/03/19   Head and Neck, Base of Tongue 35 fractions of 2 Gy each for a total of 70 Gy.    Hypertension    Joint pain    Current Outpatient Medications  Medication Sig Dispense Refill   co-enzyme Q-10 30 MG capsule Take 200 mg by mouth 2 (two) times daily.     docusate sodium (COLACE) 250 MG capsule Take 250 mg by mouth daily. (Patient not taking: Reported on 08/21/2020)     ELIQUIS 5 MG TABS tablet TAKE 1 TABLET BY MOUTH TWICE A DAY 180 tablet 1   Ginkgo Biloba (GINKGO PO) Take 1 tablet by mouth daily.     hydrochlorothiazide (HYDRODIURIL) 25 MG tablet TAKE 1 TABLET BY MOUTH EVERY DAY 90 tablet 3   metoprolol succinate (TOPROL-XL) 50 MG 24 hr tablet TAKE 1 TABLET (50 MG TOTAL) BY  MOUTH DAILY. TAKE WITH OR IMMEDIATELY FOLLOWING A MEAL. 90 tablet 3   Multiple Vitamin (MULTIVITAMIN) capsule Take 1 capsule by mouth daily.     multivitamin-iron-minerals-folic acid (CENTRUM) chewable tablet Chew 1 tablet by mouth daily.     Omega-3 Fatty Acids (FISH OIL) 1000 MG CAPS Take by mouth.     omeprazole (PRILOSEC) 20 MG capsule Take 20 mg by mouth daily.     Probiotic Product (PROBIOTIC PO) Take by mouth.     tamsulosin (FLOMAX) 0.4 MG CAPS capsule Take 0.4 mg by mouth daily after supper.     No current facility-administered medications for this visit.   Allergies  Allergen Reactions   Lisinopril Cough    VITALS: BP 124/72 (BP Location: Right Arm, Patient Position: Sitting, Cuff Size: Normal)   Pulse (!) 54   Temp 98.1 F (36.7 C) (Oral)    ASSESSMENT: Completely edentulous maxilla and mandible.   PROCEDURES: MMR for maxillary and mandibular complete dentures.  Tried in maxillary and mandibular record bases with occlusal rim(s).  Verified maxillary occlusal plane with Foxplane and adjusted until parallel with ala-tragus line.   Adjusted maxillary and mandibular rims until bilateral occlusion, appropriate lip support and appropriate incisal edge positions were achieved.   Marked maxillary midline based on nasolabial fold.  Verified even  distribution of occlusal forces along edentulous areas.  Adjusted the record bases until the patient was comfortable with fit and speech.   CR was reproduced and had the patient practice biting in CR until comfortable. Placed notches in the occlusal rims and recorded bite in CR with Genie bite registration material. Picked out tooth shade B2 which was verified by the patient.     PLAN: Next visit:  C/C wax try-in and gingival shade selection  All questions and concerns were invited and addressed.  The patient tolerated today's visit well and departed in stable condition.  Aslin Farinas B. Chales Salmon, D.M.D.

## 2020-09-10 ENCOUNTER — Encounter (HOSPITAL_COMMUNITY): Payer: Medicare HMO | Admitting: Dentistry

## 2020-09-16 ENCOUNTER — Encounter (HOSPITAL_COMMUNITY): Payer: Medicare HMO | Admitting: Dentistry

## 2020-09-24 ENCOUNTER — Other Ambulatory Visit: Payer: Self-pay

## 2020-09-24 ENCOUNTER — Ambulatory Visit (INDEPENDENT_AMBULATORY_CARE_PROVIDER_SITE_OTHER): Payer: Dental | Admitting: Dentistry

## 2020-09-24 DIAGNOSIS — K08109 Complete loss of teeth, unspecified cause, unspecified class: Secondary | ICD-10-CM

## 2020-09-24 NOTE — Progress Notes (Signed)
Department of Dental Medicine     WAX TRY-IN  Service Date:   09/24/2020  Patient Name:   Neil [name] Date of Birth:   [birth year] Medical Record Number: [id number]        TODAY'S VISIT:   Procedures:   Wax try-in for C/C dentures  Plan: Next visit:  C/C denture delivery       PROGRESS NOTE:   COVID-19 SCREENING:  The patient denies symptoms concerning for COVID-19 infection including fever, chills, cough, or newly developed shortness of breath.   HISTORY OF PRESENT ILLNESS: Neil [name] presents today for maxillary and mandibular complete dentures wax try-in appointment. Medical and dental history reviewed with the patient.  No changes reported.   CHIEF COMPLAINT:   Patient with no complaints.  Here for a routine appointment.   Patient Active Problem List   Diagnosis Date Noted   Port-A-Cath in place 11/22/2018   CKD (chronic kidney disease), stage III (HCC) 10/18/2018   Cancer of base of tongue (HCC) 10/16/2018   Past Medical History:  Diagnosis Date   Atrial fibrillation (HCC)    base of throat ca dx'd 07/2017   Chronic anticoagulation    On Eliquis   GERD (gastroesophageal reflux disease)    History of radiation therapy 11/16/18- 01/03/19   Head and Neck, Base of Tongue 35 fractions of 2 Gy each for a total of 70 Gy.    Hypertension    Joint pain    Current Outpatient Medications  Medication Sig Dispense Refill   co-enzyme Q-10 30 MG capsule Take 200 mg by mouth 2 (two) times daily.     docusate sodium (COLACE) 250 MG capsule Take 250 mg by mouth daily. (Patient not taking: Reported on 08/21/2020)     ELIQUIS 5 MG TABS tablet TAKE 1 TABLET BY MOUTH TWICE A DAY 180 tablet 1   Ginkgo Biloba (GINKGO PO) Take 1 tablet by mouth daily.     hydrochlorothiazide (HYDRODIURIL) 25 MG tablet TAKE 1 TABLET BY MOUTH EVERY DAY 90 tablet 3   metoprolol succinate (TOPROL-XL) 50 MG 24 hr tablet TAKE 1 TABLET (50 MG TOTAL) BY MOUTH DAILY. TAKE WITH OR IMMEDIATELY FOLLOWING A  MEAL. 90 tablet 3   Multiple Vitamin (MULTIVITAMIN) capsule Take 1 capsule by mouth daily.     multivitamin-iron-minerals-folic acid (CENTRUM) chewable tablet Chew 1 tablet by mouth daily.     Omega-3 Fatty Acids (FISH OIL) 1000 MG CAPS Take by mouth.     omeprazole (PRILOSEC) 20 MG capsule Take 20 mg by mouth daily.     Probiotic Product (PROBIOTIC PO) Take by mouth.     tamsulosin (FLOMAX) 0.4 MG CAPS capsule Take 0.4 mg by mouth daily after supper.     No current facility-administered medications for this visit.   Allergies  Allergen Reactions   Lisinopril Cough     VITALS: BP (!) 142/83 (BP Location: Right Arm, Patient Position: Sitting, Cuff Size: Normal)   Pulse (!) 55   Temp 98 F (36.7 C)    ASSESSMENT: Completely edentulous.   PROCEDURES: Wax-try in for maxillary and mandibular complete dentures.  Tried in Maxillary and Mandibular wax try-in dentures.   Verified plane of occlusion.  Maxillary plane of occlusion parallel with ala-tragus line.  No rocking of the wax dentures noted.   Guided the patient into CR and no interferences noted.  Bilateral balanced occlusion verified with Accufilm. Patient was given a mirror for input on overall appearance and fit of denture try-in.  The  patient is satisfied with the denture(s) teeth shape, size, color/shade, and overall appearance and fit of the the wax try-in dentures. Gingival shade L-[id number] LRP was picked out and verified by the patient.   PLAN: Next visit:  C/C denture delivery  All questions and concerns were invited and addressed.  The patient tolerated today's visit well and departed in stable condition.  Alinna Siple B. Chales Salmon, DMD

## 2020-10-08 ENCOUNTER — Other Ambulatory Visit: Payer: Self-pay

## 2020-10-08 ENCOUNTER — Encounter (HOSPITAL_COMMUNITY): Payer: Self-pay | Admitting: Dentistry

## 2020-10-08 ENCOUNTER — Ambulatory Visit (INDEPENDENT_AMBULATORY_CARE_PROVIDER_SITE_OTHER): Payer: Dental | Admitting: Dentistry

## 2020-10-08 DIAGNOSIS — K08109 Complete loss of teeth, unspecified cause, unspecified class: Secondary | ICD-10-CM

## 2020-10-08 NOTE — Progress Notes (Signed)
Department of Dental Medicine     DENTURE DELIVERY  Service Date:   10/08/2020  Patient Name:   Neil [name] Date of Birth:   1945/03/31 Medical Record Number: [id number]        TODAY'S VISIT:   Procedures:   Delivered upper and lower complete dentures.  Plan/recommendations: Wear your new dentures as much as possible (or as tolerable) before your next dental visit.  Practice eating soft foods and speaking.  We will make adjustments to your dentures as needed at your next visit where you notice discomfort or soreness. Do not wear your dentures if they are causing too much pain in one or more areas and would cause sore spots if you continued to wear them.  If this happens, you should remove them and remember where they were hurting so we can adjust these areas at the next visit. Remove dentures at night.  Next visit:  Denture adjustment as scheduled       PROGRESS NOTE:   COVID-19 SCREENING:  The patient denies symptoms concerning for COVID-19 infection including fever, chills, cough, or newly developed shortness of breath.   HISTORY OF PRESENT ILLNESS: Neil [name] presents today for maxillary and mandibular complete denture delivery.  Medical and dental history reviewed with the patient.  No changes reported.   CHIEF COMPLAINT:   Patient with no complaints.  Here for a routine dental appointment.   Patient Active Problem List   Diagnosis Date Noted   Port-A-Cath in place 11/22/2018   CKD (chronic kidney disease), stage III (HCC) 10/18/2018   Cancer of base of tongue (HCC) 10/16/2018   Past Medical History:  Diagnosis Date   Atrial fibrillation (HCC)    base of throat ca dx'd 07/2017   Chronic anticoagulation    On Eliquis   GERD (gastroesophageal reflux disease)    History of radiation therapy 11/16/18- 01/03/19   Head and Neck, Base of Tongue 35 fractions of 2 Gy each for a total of 70 Gy.    Hypertension    Joint pain    Current Outpatient Medications   Medication Sig Dispense Refill   co-enzyme Q-10 30 MG capsule Take 200 mg by mouth 2 (two) times daily.     docusate sodium (COLACE) 250 MG capsule Take 250 mg by mouth daily. (Patient not taking: Reported on 08/21/2020)     ELIQUIS 5 MG TABS tablet TAKE 1 TABLET BY MOUTH TWICE A DAY 180 tablet 1   Ginkgo Biloba (GINKGO PO) Take 1 tablet by mouth daily.     hydrochlorothiazide (HYDRODIURIL) 25 MG tablet TAKE 1 TABLET BY MOUTH EVERY DAY 90 tablet 3   metoprolol succinate (TOPROL-XL) 50 MG 24 hr tablet TAKE 1 TABLET (50 MG TOTAL) BY MOUTH DAILY. TAKE WITH OR IMMEDIATELY FOLLOWING A MEAL. 90 tablet 3   Multiple Vitamin (MULTIVITAMIN) capsule Take 1 capsule by mouth daily.     multivitamin-iron-minerals-folic acid (CENTRUM) chewable tablet Chew 1 tablet by mouth daily.     Omega-3 Fatty Acids (FISH OIL) 1000 MG CAPS Take by mouth.     omeprazole (PRILOSEC) 20 MG capsule Take 20 mg by mouth daily.     Probiotic Product (PROBIOTIC PO) Take by mouth.     tamsulosin (FLOMAX) 0.4 MG CAPS capsule Take 0.4 mg by mouth daily after supper.     No current facility-administered medications for this visit.   Allergies  Allergen Reactions   Lisinopril Cough    VITALS: BP 132/66 (BP Location: Right Arm, Patient  Position: Sitting, Cuff Size: Normal)   Pulse (!) 49   Temp 97.6 F (36.4 C) (Oral)    ASSESSMENT: Completely edentulous   PROCEDURES: Maxillary and mandibular complete denture delivery.   Maxillary and mandibular dentures seated.  No rocking noted.  Applied PIP paste and seated maxillary and mandibular dentures.  Noted heavy spots and adjusted with acrylic bur as needed.  Guided patient's mandible into CR and verified occlusion using Accufilm.  Adjusted occlusion as needed until bilateral balanced occlusion achieved.  Polished areas where adjustments were made.  Good esthetics, phonetics, overall retention and function noted. Had patient demonstrate placing and removing dentures.   Discussed home care with the patient including brushing with dish soap with denture toothbrush in the evenings and storing in a bowl of water at night.  Restrictions and limitations of dentures reviewed with the patient in verbal and written format.  The patient verbalized understanding of instructions and is satisfied with overall appearance and fit of dentures.   Denture toothbrush and container given to patient.   PLAN: Follow-up early next week (in ~5 days) for denture adjustment(s). Call should any questions or concerns arise.  All questions and concerns were invited and addressed.  The patient tolerated today's visit well and departed in stable condition.  Heiley Shaikh B. Chales Salmon, DMD

## 2020-10-08 NOTE — Patient Instructions (Signed)
Elsberry Benson Norway, D.M.D. Phone: [phone] Fax: [phone]   Congratulations, you are on the way to oral rehabilitation!  You have just received a new set of complete or partial dentures.  Dentures give you many of the benefits of a full set of teeth.  They help restore your ability to bite and chew food.  They can help you speak more clearly.  And having dentures can improve the shape of your jaw line and give you a natural smile.    Getting used to dentures can take a while.  At first, you will be aware of a new feeling in your mouth.  Biting and chewing on your food and even talking may seem a little different.  But soon they will feel like a natural part of your mouth.  These instructions will help you get adjusted to your dentures as well as care for them properly.  Please read these instructions carefully and completely as soon as you get home.  If you or your caregiver have any questions please call the [facility] at [phone].    INSTRUCTIONS FOR DENTURE USE AND CARE   How Will Your Dentures Look and Feel? Soon after you begin wearing your dentures, you may feel that your dentures are too large or even loose.  As our mouth and facial muscles become accustomed to the dentures, these feelings will go away.   You also may feel that you are salivating more than you normally do.  This feeling should go away as you get used to having the dentures in your mouth.   You may bite your cheek or your tongue; this will eventually resolve itself as you wear your dentures.  Some soreness is to be expected, but you should not hurt.  If your mouth hurts, call your dentist. A denture adhesive may occasionally be necessary to hold your dentures in place more securely.  The dentist will let you know when one is recommended for you. Speaking Wearing dentures will change the sound of your voice initially.  This will be noticed by you more  than anyone else.  Bite and swallow before you speak, in order to place your dentures in position so that you may speak more clearly.  Practice speaking by reading aloud or counting from 1 to 100 very slowly and distinctly.  After some practice, your mouth will become accustomed to your dentures and you will speak more clearly. Eating Chewing will definitely feel different after you receive your dentures.  With a little practice and patience, you should be able to eat just about any kind of food.  Begin by eating small quantities of food that are cut into small pieces.  Start with soft foods such as eggs, cooked vegetables, or puddings.  As you gain confidence, try more foods.   How do you care for your dentures? Dentures can collect plaque and calculus much the same as natural teeth can.  If not removed on a regular basis, your dentures will not look or feel clean, and you will experience denture odor.  It is very important that you remove your dentures at bedtime and clean them thoroughly.   You should: Clean your dentures over a sink full of water so if dropped, they will not break.  You can also stand over a folded towel. Rinse your dentures with cool water to remove any large food particles. Use soap and water or a denture cleanser or paste  to clean the dentures.  Do not use regular toothpaste as it may abrade the denture base or teeth. Use a moistened denture brush to clean all surfaces (inside and outside). Rinse thoroughly to remove any remaining soap or denture cleanser. Use a soft bristle toothbrush to gently brush any natural teeth, gums, tongue, and palate at bedtime and before reinserting your dentures. Do not sleep with your dentures in your mouth at night.  Remove your dentures and soak them overnight in a denture cup filled with water or denture solution as recommended by your dentist. This routine will become second nature and will increase the life and comfort of your dentures.      When should you call for help? Watch closely for changes in your health, and be sure to contact your dentist if: Your dentures do not fit well. You have sores in your mouth. Your dentures cause pain.   Follow-up Your dentist will adjust your dentures from time to time to help them continue to fit well and to see you at least once a year for a check-up and examination.  Please do not try to adjust your dentures yourself; you could damage them.

## 2020-10-13 ENCOUNTER — Other Ambulatory Visit: Payer: Self-pay

## 2020-10-13 ENCOUNTER — Ambulatory Visit (INDEPENDENT_AMBULATORY_CARE_PROVIDER_SITE_OTHER): Payer: Dental | Admitting: Dentistry

## 2020-10-13 DIAGNOSIS — K08109 Complete loss of teeth, unspecified cause, unspecified class: Secondary | ICD-10-CM

## 2020-10-13 NOTE — Progress Notes (Signed)
Department of Dental Medicine     DENTURE ADJUSTMENT  Service Date:   10/13/2020  Patient Name:   Neil [name] Date of Birth:   [birth year] Medical Record Number: [id number]        TODAY'S VISIT:   Procedures:   C/C denture adjustment  Plan/recommendations: Denture adjustments- 1 week       PROGRESS NOTE:   COVID-19 SCREENING:  The patient denies symptoms concerning for COVID-19 infection including fever, chills, cough, or newly developed shortness of breath.   HISTORY OF PRESENT ILLNESS: Neil [name] presents today for upper and lower complete dentures adjustment.  Delivery of dentures was completed on 10/08/20. Medical and dental history reviewed with the patient.   CHIEF COMPLAINT:   Patient reports that he has been able to wear his dentures for about 10 hours a day.  He says that he has had some difficulty eating, but as of yesterday he has improved and is learning how to chew with them.  He has not needed any Fixodent to hold them in.  He reports having only one or two small sore spots that he thinks need to be adjusted, but overall is very happy with them.   Patient Active Problem List   Diagnosis Date Noted   Port-A-Cath in place 11/22/2018   CKD (chronic kidney disease), stage III (HCC) 10/18/2018   Cancer of base of tongue (HCC) 10/16/2018   Past Medical History:  Diagnosis Date   Atrial fibrillation (HCC)    base of throat ca dx'd 07/2017   Chronic anticoagulation    On Eliquis   GERD (gastroesophageal reflux disease)    History of radiation therapy 11/16/18- 01/03/19   Head and Neck, Base of Tongue 35 fractions of 2 Gy each for a total of 70 Gy.    Hypertension    Joint pain    Current Outpatient Medications  Medication Sig Dispense Refill   co-enzyme Q-10 30 MG capsule Take 200 mg by mouth 2 (two) times daily.     docusate sodium (COLACE) 250 MG capsule Take 250 mg by mouth daily. (Patient not taking: Reported on 08/21/2020)     ELIQUIS 5 MG TABS tablet  TAKE 1 TABLET BY MOUTH TWICE A DAY 180 tablet 1   Ginkgo Biloba (GINKGO PO) Take 1 tablet by mouth daily.     hydrochlorothiazide (HYDRODIURIL) 25 MG tablet TAKE 1 TABLET BY MOUTH EVERY DAY 90 tablet 3   metoprolol succinate (TOPROL-XL) 50 MG 24 hr tablet TAKE 1 TABLET (50 MG TOTAL) BY MOUTH DAILY. TAKE WITH OR IMMEDIATELY FOLLOWING A MEAL. 90 tablet 3   Multiple Vitamin (MULTIVITAMIN) capsule Take 1 capsule by mouth daily.     multivitamin-iron-minerals-folic acid (CENTRUM) chewable tablet Chew 1 tablet by mouth daily.     Omega-3 Fatty Acids (FISH OIL) 1000 MG CAPS Take by mouth.     omeprazole (PRILOSEC) 20 MG capsule Take 20 mg by mouth daily.     Probiotic Product (PROBIOTIC PO) Take by mouth.     tamsulosin (FLOMAX) 0.4 MG CAPS capsule Take 0.4 mg by mouth daily after supper.     No current facility-administered medications for this visit.   Allergies  Allergen Reactions   Lisinopril Cough    VITALS: BP 133/77 (BP Location: Right Arm, Patient Position: Sitting, Cuff Size: Normal)   Pulse 61   Temp 98.4 F (36.9 C) (Oral)    ASSESSMENT: Completely edentulous   PROCEDURES: Maxillary and mandibular complete dentures adjustment.   Applied PIP  paste and seated maxillary and mandibular dentures.  Noted heavy spots and adjusted with acrylic bur as needed.   Verified occlusion using Accufilm.  Adjusted occlusion as needed until bilateral balanced occlusion achieved.  Polished areas where adjustments were made.  Good esthetics, phonetics, overall retention and function noted. Patient reported that after adjustments sore areas felt much better.   PLAN: Follow-up again in about 1 week for adjustments. Call should any questions or concerns arise.  All questions and concerns were invited and addressed.  The patient tolerated today's visit well and departed in stable condition.  Kirin Brandenburger B. Chales Salmon, DMD

## 2020-10-22 ENCOUNTER — Other Ambulatory Visit: Payer: Self-pay

## 2020-10-22 ENCOUNTER — Ambulatory Visit (INDEPENDENT_AMBULATORY_CARE_PROVIDER_SITE_OTHER): Payer: Dental | Admitting: Dentistry

## 2020-10-22 ENCOUNTER — Encounter (HOSPITAL_COMMUNITY): Payer: Self-pay | Admitting: Dentistry

## 2020-10-22 DIAGNOSIS — K08109 Complete loss of teeth, unspecified cause, unspecified class: Secondary | ICD-10-CM

## 2020-10-22 NOTE — Progress Notes (Signed)
Department of Dental Medicine     DENTURE ADJUSTMENT  Service Date:   10/22/2020  Patient Name:   Neil [name] Date of Birth:   [birth year] Medical Record Number: [id number]        TODAY'S VISIT:   Procedures:   C/C denture adjustment  Plan/recommendations: Denture adjustment in 2 weeks.       PROGRESS NOTE:   COVID-19 SCREENING:  The patient denies symptoms concerning for COVID-19 infection including fever, chills, cough, or newly developed shortness of breath.   HISTORY OF PRESENT ILLNESS: Neil [name] presents today for 2nd C/C denture adjustment. Medical and dental history reviewed with the patient.   CHIEF COMPLAINT:   Patient reports having one area on the bottom in the front vestibule (points to area) that has really been hurting when he chews and inserts his dentures.   Patient Active Problem List   Diagnosis Date Noted   Port-A-Cath in place 11/22/2018   CKD (chronic kidney disease), stage III (HCC) 10/18/2018   Cancer of base of tongue (HCC) 10/16/2018   Past Medical History:  Diagnosis Date   Atrial fibrillation (HCC)    base of throat ca dx'd 07/2017   Chronic anticoagulation    On Eliquis   GERD (gastroesophageal reflux disease)    History of radiation therapy 11/16/18- 01/03/19   Head and Neck, Base of Tongue 35 fractions of 2 Gy each for a total of 70 Gy.    Hypertension    Joint pain    Current Outpatient Medications  Medication Sig Dispense Refill   co-enzyme Q-10 30 MG capsule Take 200 mg by mouth 2 (two) times daily.     docusate sodium (COLACE) 250 MG capsule Take 250 mg by mouth daily. (Patient not taking: Reported on 08/21/2020)     ELIQUIS 5 MG TABS tablet TAKE 1 TABLET BY MOUTH TWICE A DAY 180 tablet 1   Ginkgo Biloba (GINKGO PO) Take 1 tablet by mouth daily.     hydrochlorothiazide (HYDRODIURIL) 25 MG tablet TAKE 1 TABLET BY MOUTH EVERY DAY 90 tablet 3   metoprolol succinate (TOPROL-XL) 50 MG 24 hr tablet TAKE 1 TABLET (50 MG TOTAL) BY  MOUTH DAILY. TAKE WITH OR IMMEDIATELY FOLLOWING A MEAL. 90 tablet 3   Multiple Vitamin (MULTIVITAMIN) capsule Take 1 capsule by mouth daily.     multivitamin-iron-minerals-folic acid (CENTRUM) chewable tablet Chew 1 tablet by mouth daily.     Omega-3 Fatty Acids (FISH OIL) 1000 MG CAPS Take by mouth.     omeprazole (PRILOSEC) 20 MG capsule Take 20 mg by mouth daily.     Probiotic Product (PROBIOTIC PO) Take by mouth.     tamsulosin (FLOMAX) 0.4 MG CAPS capsule Take 0.4 mg by mouth daily after supper.     No current facility-administered medications for this visit.   Allergies  Allergen Reactions   Lisinopril Cough    VITALS: BP 135/78 (BP Location: Right Arm, Patient Position: Sitting, Cuff Size: Normal)   Pulse (!) 51   Temp 98.3 F (36.8 C) (Oral)    ASSESSMENT: Completely edentulous.   PROCEDURES: Denture adjustment of C/C dentures.   Mandibular denture seated.   Applied PIP paste and seated mandibular dentures.  Noted heavy spots and adjusted with acrylic bur as needed.  Anterior mandibular vestibule notable for ulceration at midline.  Guided patient's mandible into CR and verified occlusion using Accufilm.  Adjusted occlusion as needed until bilateral balanced occlusion achieved.  Polished areas where adjustments were made.  Good  esthetics, phonetics, overall retention and function noted. The patient reported that his dentures felt much better upon insertion and biting down.   PLAN: Follow-up in 2 weeks for denture adjustment. Call should any questions or concerns arise.   All questions and concerns were invited and addressed.  The patient tolerated today's visit well and departed in stable condition.  Keierra Nudo B. Chales Salmon, DMD

## 2020-11-05 ENCOUNTER — Ambulatory Visit (INDEPENDENT_AMBULATORY_CARE_PROVIDER_SITE_OTHER): Payer: Dental | Admitting: Dentistry

## 2020-11-05 ENCOUNTER — Encounter (HOSPITAL_COMMUNITY): Payer: Self-pay | Admitting: Dentistry

## 2020-11-05 ENCOUNTER — Other Ambulatory Visit: Payer: Self-pay

## 2020-11-05 DIAGNOSIS — K08109 Complete loss of teeth, unspecified cause, unspecified class: Secondary | ICD-10-CM

## 2020-11-05 NOTE — Progress Notes (Signed)
Department of Dental Medicine     DENTURE ADJUSTMENT  Service Date:   11/05/2020  Patient Name:   Neil [name] Date of Birth:   1945/07/12 Medical Record Number: [id number]        TODAY'S VISIT:   Procedures:   C/C denture adjustment(s)  Plan/recommendations: Denture adjustments as needed in 4 weeks       PROGRESS NOTE:   COVID-19 SCREENING:  The patient denies symptoms concerning for COVID-19 infection including fever, chills, cough, or newly developed shortness of breath.   HISTORY OF PRESENT ILLNESS: Neil [name] presents today for complete maxillary and mandibular denture adjustment. Medical and dental history reviewed with the patient. No changes reported.   CHIEF COMPLAINT:   Patient reports that his lower denture has been doing much better since his last adjustment.  He reports that he does still have a sore area in the anterior region of his lower jaw, but otherwise he is happy with them.   Patient Active Problem List   Diagnosis Date Noted   Port-A-Cath in place 11/22/2018   CKD (chronic kidney disease), stage III (HCC) 10/18/2018   Cancer of base of tongue (HCC) 10/16/2018   Past Medical History:  Diagnosis Date   Atrial fibrillation (HCC)    base of throat ca dx'd 07/2017   Chronic anticoagulation    On Eliquis   GERD (gastroesophageal reflux disease)    History of radiation therapy 11/16/18- 01/03/19   Head and Neck, Base of Tongue 35 fractions of 2 Gy each for a total of 70 Gy.    Hypertension    Joint pain    Current Outpatient Medications  Medication Sig Dispense Refill   co-enzyme Q-10 30 MG capsule Take 200 mg by mouth 2 (two) times daily.     docusate sodium (COLACE) 250 MG capsule Take 250 mg by mouth daily. (Patient not taking: Reported on 08/21/2020)     ELIQUIS 5 MG TABS tablet TAKE 1 TABLET BY MOUTH TWICE A DAY 180 tablet 1   Ginkgo Biloba (GINKGO PO) Take 1 tablet by mouth daily.     hydrochlorothiazide (HYDRODIURIL) 25 MG tablet TAKE 1  TABLET BY MOUTH EVERY DAY 90 tablet 3   metoprolol succinate (TOPROL-XL) 50 MG 24 hr tablet TAKE 1 TABLET (50 MG TOTAL) BY MOUTH DAILY. TAKE WITH OR IMMEDIATELY FOLLOWING A MEAL. 90 tablet 3   Multiple Vitamin (MULTIVITAMIN) capsule Take 1 capsule by mouth daily.     multivitamin-iron-minerals-folic acid (CENTRUM) chewable tablet Chew 1 tablet by mouth daily.     Omega-3 Fatty Acids (FISH OIL) 1000 MG CAPS Take by mouth.     omeprazole (PRILOSEC) 20 MG capsule Take 20 mg by mouth daily.     Probiotic Product (PROBIOTIC PO) Take by mouth.     tamsulosin (FLOMAX) 0.4 MG CAPS capsule Take 0.4 mg by mouth daily after supper.     No current facility-administered medications for this visit.   Allergies  Allergen Reactions   Lisinopril Cough    VITALS: BP (!) 143/88 (BP Location: Right Arm, Patient Position: Sitting, Cuff Size: Normal)   Pulse 64   Temp 98.1 F (36.7 C) (Oral)    ASSESSMENT: Completely edentulous maxilla and mandible   PROCEDURES: C/C denture adjustment(s).   Mandibular sore spot noted in the lower anterior region.  Applied PIP paste and seated mandibular denture. Noted heavy spots and adjusted with acrylic bur as needed.  Guided patient's mandible into CR and verified occlusion using Accufilm.  Adjusted  occlusion as needed until bilateral balanced occlusion achieved.  Polished areas where adjustments were made.  Good esthetics, phonetics, overall retention and function noted. The patient expressed satisfaction with adjustments made and stated that they feel much better.     PLAN: Follow-up in 4 weeks for denture adjustments as needed. Call should any questions or concerns arise before next visit.  All questions and concerns were invited and addressed.  The patient tolerated today's visit well and departed in stable condition.  Brandye Inthavong B. Chales Salmon, DMD

## 2020-11-11 ENCOUNTER — Other Ambulatory Visit: Payer: Self-pay | Admitting: Cardiovascular Disease

## 2020-11-11 NOTE — Telephone Encounter (Signed)
Prescription refill request for Eliquis received. Indication:atrial fib Last office visit:5/22 Scr:1.0 Age: 75 Weight:108.4 kg  Prescription refilled

## 2020-11-13 ENCOUNTER — Other Ambulatory Visit: Payer: Self-pay | Admitting: Cardiovascular Disease

## 2020-12-03 ENCOUNTER — Other Ambulatory Visit: Payer: Self-pay

## 2020-12-03 ENCOUNTER — Ambulatory Visit (INDEPENDENT_AMBULATORY_CARE_PROVIDER_SITE_OTHER): Payer: Dental | Admitting: Dentistry

## 2020-12-03 DIAGNOSIS — K08109 Complete loss of teeth, unspecified cause, unspecified class: Secondary | ICD-10-CM

## 2020-12-03 NOTE — Progress Notes (Signed)
Department of Dental Medicine     DENTURE ADJUSTMENT  Service Date:   12/03/2020  Patient Name:   Neil [name] Date of Birth:   Nov 18, 1945 Medical Record Number: [id number]        TODAY'S VISIT:   Procedures:   C/C denture adjustment  Plan/recommendations: Denture adjustments as needed 6 month follow-up       PROGRESS NOTE:   COVID-19 SCREENING:  The patient denies symptoms concerning for COVID-19 infection including fever, chills, cough, or newly developed shortness of breath.   HISTORY OF PRESENT ILLNESS: Neil [name] presents today for maxillary and mandibular complete denture adjustment. Medical and dental history reviewed with the patient.   CHIEF COMPLAINT:   Patient reports that his dentures have been doing very well and he has no complaints or sore areas.  He says that at night he sometimes forgets that they are in and he forgets to take them out they are so comfortable to him.  He says that he has not needed to use any adhesive to hold the upper or lower denture in.   Patient Active Problem List   Diagnosis Date Noted   Port-A-Cath in place 11/22/2018   CKD (chronic kidney disease), stage III (HCC) 10/18/2018   Cancer of base of tongue (HCC) 10/16/2018   Past Medical History:  Diagnosis Date   Atrial fibrillation (HCC)    base of throat ca dx'd 07/2017   Chronic anticoagulation    On Eliquis   GERD (gastroesophageal reflux disease)    History of radiation therapy 11/16/18- 01/03/19   Head and Neck, Base of Tongue 35 fractions of 2 Gy each for a total of 70 Gy.    Hypertension    Joint pain    Current Outpatient Medications  Medication Sig Dispense Refill   co-enzyme Q-10 30 MG capsule Take 200 mg by mouth 2 (two) times daily.     docusate sodium (COLACE) 250 MG capsule Take 250 mg by mouth daily. (Patient not taking: Reported on 08/21/2020)     ELIQUIS 5 MG TABS tablet TAKE 1 TABLET BY MOUTH TWICE A DAY 180 tablet 1   Ginkgo Biloba (GINKGO PO) Take 1  tablet by mouth daily.     hydrochlorothiazide (HYDRODIURIL) 25 MG tablet TAKE 1 TABLET BY MOUTH EVERY DAY 90 tablet 0   metoprolol succinate (TOPROL-XL) 50 MG 24 hr tablet TAKE 1 TABLET (50 MG TOTAL) BY MOUTH DAILY. TAKE WITH OR IMMEDIATELY FOLLOWING A MEAL. 90 tablet 3   Multiple Vitamin (MULTIVITAMIN) capsule Take 1 capsule by mouth daily.     multivitamin-iron-minerals-folic acid (CENTRUM) chewable tablet Chew 1 tablet by mouth daily.     Omega-3 Fatty Acids (FISH OIL) 1000 MG CAPS Take by mouth.     omeprazole (PRILOSEC) 20 MG capsule Take 20 mg by mouth daily.     Probiotic Product (PROBIOTIC PO) Take by mouth.     tamsulosin (FLOMAX) 0.4 MG CAPS capsule Take 0.4 mg by mouth daily after supper.     No current facility-administered medications for this visit.   Allergies  Allergen Reactions   Lisinopril Cough    VITALS: BP 127/80 (BP Location: Right Arm, Patient Position: Sitting, Cuff Size: Normal)   Pulse (!) 52   Temp 97.8 F (36.6 C) (Oral)    ASSESSMENT: Completely edentulous   PROCEDURES: Denture adjustment of complete upper and lower dentures.  Maxillary and mandibular dentures seated.  Good esthetics, phonetics, overall retention and function noted.  No adjustments needed.  PLAN: Follow-up as needed for denture adjustment or 6 month follow-up. Call should any questions or concerns arise.   All questions and concerns were invited and addressed.  The patient tolerated today's visit well and departed in stable condition.  Laurie Lovejoy B. Chales Salmon, DMD

## 2021-02-16 ENCOUNTER — Ambulatory Visit (HOSPITAL_COMMUNITY): Payer: Medicare HMO

## 2021-02-16 ENCOUNTER — Inpatient Hospital Stay: Payer: Medicare HMO | Attending: Family Medicine

## 2021-02-18 ENCOUNTER — Telehealth: Payer: Self-pay | Admitting: *Deleted

## 2021-02-18 NOTE — Telephone Encounter (Signed)
RETURNED PATIENT'S PHONE CALL, SPOKE WITH PATIENT. ?

## 2021-02-20 ENCOUNTER — Ambulatory Visit: Payer: Medicare HMO | Admitting: Radiation Oncology

## 2021-04-14 ENCOUNTER — Telehealth: Payer: Self-pay | Admitting: *Deleted

## 2021-04-14 NOTE — Telephone Encounter (Signed)
RETURNED PATIENT'S PHONE CALL, SPOKE WITH PATIENT. ?

## 2021-04-16 NOTE — Progress Notes (Signed)
Mr. Gilham presents for follow up of radiation completed 01/03/19 to his base of tongue and bilateral neck nodes  Pain issues, if any: Reports occasional discomfort to the right side of his neck. States it's brief and usually resolves after manual massage  Using a feeding tube?: N/A Weight changes, if any:  Wt Readings from Last 3 Encounters:  04/17/21 246 lb (111.6 kg)  08/21/20 239 lb (108.4 kg)  07/07/20 240 lb 12.8 oz (109.2 kg)   Swallowing issues, if any: Patient denies. Reports he doesn't have any trouble swallowing, but he avoids a wide variety due to lingering altered sense of taste  Smoking or chewing tobacco? None Using fluoride trays daily? N/A--had F/U with Dr. Benson Norway on 12/03/20 to assess dentures Last ENT visit was on: "03/12/2021 Saw Dr. Izora Gala  --Physical Exam:  Healthy-appearing elderly gentleman in no distress. Breathing and voice are clear and healthy. Intranasal exam is unremarkable. Oral cavity and pharynx looks healthy and clear. He is edentulous. Indirect examination of the tongue base hypopharynx and larynx is all normal and clear. There is no palpable adenopathy or masses in the neck. --Impression & Plans:  Stable, no evidence of recurrent disease. Follow-up 3 months or sooner as needed."  Other notable issues, if any: Saw Dr. Chryl Heck on 08/21/20, and is scheduled to see her again this coming June. States he is making more saliva, and dry mouth has improved. Reports occasional discomfort bilaterally to his jaw if he's trying to eat something difficult without his dentures. Reports occasional pain to abdomen where PEG site was. He is concerned about his recent weight gain and plans to start exercising to address. Also reports he recently stopped all his vitamins/supplements he was taking to see if were actually beneficial or possibly interacting with his prescription medications.

## 2021-04-17 ENCOUNTER — Other Ambulatory Visit: Payer: Self-pay

## 2021-04-17 ENCOUNTER — Ambulatory Visit
Admission: RE | Admit: 2021-04-17 | Discharge: 2021-04-17 | Disposition: A | Discharge status: Home / Self Care | Payer: Medicare HMO | Source: Ambulatory Visit | Attending: Radiation Oncology | Admitting: Radiation Oncology

## 2021-04-17 ENCOUNTER — Telehealth: Payer: Self-pay | Admitting: *Deleted

## 2021-04-17 VITALS — BP 149/72 | HR 70 | Temp 97.7°F | Resp 20 | Ht 70.0 in | Wt 246.0 lb

## 2021-04-17 DIAGNOSIS — Z7901 Long term (current) use of anticoagulants: Secondary | ICD-10-CM | POA: Insufficient documentation

## 2021-04-17 DIAGNOSIS — Z923 Personal history of irradiation: Secondary | ICD-10-CM | POA: Insufficient documentation

## 2021-04-17 DIAGNOSIS — Z85810 Personal history of malignant neoplasm of tongue: Secondary | ICD-10-CM | POA: Insufficient documentation

## 2021-04-17 DIAGNOSIS — Z79899 Other long term (current) drug therapy: Secondary | ICD-10-CM | POA: Insufficient documentation

## 2021-04-17 DIAGNOSIS — R109 Unspecified abdominal pain: Secondary | ICD-10-CM | POA: Insufficient documentation

## 2021-04-17 DIAGNOSIS — C01 Malignant neoplasm of base of tongue: Secondary | ICD-10-CM

## 2021-04-17 DIAGNOSIS — Z1329 Encounter for screening for other suspected endocrine disorder: Secondary | ICD-10-CM

## 2021-04-17 DIAGNOSIS — R682 Dry mouth, unspecified: Secondary | ICD-10-CM | POA: Insufficient documentation

## 2021-04-17 LAB — T4, FREE: Free T4: 0.74 ng/dL (ref 0.61–1.12)

## 2021-04-17 LAB — TSH: TSH: 5.577 u[IU]/mL — ABNORMAL HIGH (ref 0.320–4.118)

## 2021-04-17 NOTE — Telephone Encounter (Signed)
CALLED PATIENT TO ASK ABOUT COMING FOR LAB TODAY @ 1:45 PM, SPOKE WITH PATIENT AND HE AGREED TO DO SO.

## 2021-04-20 ENCOUNTER — Encounter: Payer: Self-pay | Admitting: Radiation Oncology

## 2021-04-20 ENCOUNTER — Other Ambulatory Visit: Payer: Self-pay | Admitting: Radiation Oncology

## 2021-04-20 DIAGNOSIS — C01 Malignant neoplasm of base of tongue: Secondary | ICD-10-CM

## 2021-04-20 DIAGNOSIS — E038 Other specified hypothyroidism: Secondary | ICD-10-CM

## 2021-04-20 MED ORDER — LEVOTHYROXINE SODIUM 25 MCG PO TABS: ORAL_TABLET | ORAL | 1 refills | Status: DC

## 2021-04-20 NOTE — Progress Notes (Signed)
Radiation Oncology         [phone] ________________________________  Name: Neil [name] MRN: [id number]  Date: 04/17/2021  DOB: 1945/09/18  Follow-Up Visit Note in person  CC: Neil Au, MD  Neil Fischer MD  Diagnosis and Prior Radiotherapy:       ICD-10-CM   1. Cancer of base of tongue (HCC)  C01       11/16/2018 through 01/03/2019  Site Technique Total Dose (Gy) Dose per Fx (Gy) Completed Fx Beam Energies  Head & neck: HN_BOT IMRT 70/70 2 35/35 6X    CHIEF COMPLAINT:  Here for follow-up and surveillance of base of tongue cancer  Narrative:     Neil [name] presents for follow up of radiation completed 01/03/19 to his base of tongue and bilateral neck nodes  Pain issues, if any: Reports occasional discomfort to the right side of his neck. States it's brief and usually resolves after manual massage  Using a feeding tube?: N/A Weight changes, if any:  Wt Readings from Last 3 Encounters:  04/17/21 246 lb (111.6 kg)  08/21/20 239 lb (108.4 kg)  07/07/20 240 lb 12.8 oz (109.2 kg)   Swallowing issues, if any: Patient denies. Reports he doesn't have any trouble swallowing, but he avoids a wide variety due to lingering altered sense of taste  Smoking or chewing tobacco? None Using fluoride trays daily? N/A--had F/U with Dr. Chales Salmon on 12/03/20 to assess dentures Last ENT visit was on: "03/12/2021 Saw Dr. Serena Colonel  --Physical Exam:  Healthy-appearing elderly gentleman in no distress. Breathing and voice are clear and healthy. Intranasal exam is unremarkable. Oral cavity and pharynx looks healthy and clear. He is edentulous. Indirect examination of the tongue base hypopharynx and larynx is all normal and clear. There is no palpable adenopathy or masses in the neck. --Impression & Plans:  Stable, no evidence of recurrent disease. Follow-up 3 months or sooner as needed."  Other notable issues, if any: Saw Dr. Al Pimple on 08/21/20, and is scheduled to see her again this  coming June. States he is making more saliva, and dry mouth has improved. Reports occasional discomfort bilaterally to his jaw if he's trying to eat something difficult without his dentures. Reports occasional pain to abdomen where PEG site was. He is concerned about his recent weight gain despite vegetable/lean meat diet and plans to start exercising to address. Also reports he recently stopped all his vitamins/supplements he was taking to see if were actually beneficial or possibly interacting with his prescription medications.     ALLERGIES:  is allergic to lisinopril.  Meds: Current Outpatient Medications  Medication Sig Dispense Refill   ELIQUIS 5 MG TABS tablet TAKE 1 TABLET BY MOUTH TWICE A DAY 180 tablet 1   hydrochlorothiazide (HYDRODIURIL) 25 MG tablet TAKE 1 TABLET BY MOUTH EVERY DAY 90 tablet 0   omeprazole (PRILOSEC) 20 MG capsule Take 20 mg by mouth daily.     Probiotic Product (PROBIOTIC PO) Take by mouth.     tamsulosin (FLOMAX) 0.4 MG CAPS capsule Take 0.4 mg by mouth daily after supper.     co-enzyme Q-10 30 MG capsule Take 200 mg by mouth 2 (two) times daily. (Patient not taking: Reported on 04/17/2021)     docusate sodium (COLACE) 250 MG capsule Take 250 mg by mouth daily. (Patient not taking: Reported on 08/21/2020)     Ginkgo Biloba (GINKGO PO) Take 1 tablet by mouth daily. (Patient not taking: Reported on 04/17/2021)     levothyroxine (  SYNTHROID) 25 MCG tablet Take 1 tab PO QAM on empty stomach with water; wait 1 hour before food, vitamins, or other meds. 90 tablet 1   metoprolol succinate (TOPROL-XL) 50 MG 24 hr tablet TAKE 1 TABLET (50 MG TOTAL) BY MOUTH DAILY. TAKE WITH OR IMMEDIATELY FOLLOWING A MEAL. 90 tablet 3   Multiple Vitamin (MULTIVITAMIN) capsule Take 1 capsule by mouth daily. (Patient not taking: Reported on 04/17/2021)     multivitamin-iron-minerals-folic acid (CENTRUM) chewable tablet Chew 1 tablet by mouth daily. (Patient not taking: Reported on 04/17/2021)      Omega-3 Fatty Acids (FISH OIL) 1000 MG CAPS Take by mouth. (Patient not taking: Reported on 04/17/2021)     No current facility-administered medications for this encounter.    Physical Findings: The patient is in no acute distress. Patient is alert and oriented. Wt Readings from Last 3 Encounters:  04/17/21 246 lb (111.6 kg)  08/21/20 239 lb (108.4 kg)  07/07/20 240 lb 12.8 oz (109.2 kg)    height is 5\' 10"  (1.778 m) and weight is 246 lb (111.6 kg). His temperature is 97.7 F (36.5 C). His blood pressure is 149/72 (abnormal) and his pulse is 70. His respiration is 20 and oxygen saturation is 99%. .  General: Alert and oriented, in no acute distress HEENT: Head is normocephalic. Extraocular movements are intact. Oropharynx is notable for no thrush and no sign of tumor on external exam.  He is edentulous - dentures removed Neck: Neck is notable for healed skin with no palpable adenopathy in the cervical or supraclavicular regions Psychiatric: Judgment and insight are intact. Affect is appropriate. Heart RRR Chest CTAB  Lab Findings: Lab Results  Component Value Date   WBC 4.2 08/25/2020   HGB 15.2 08/25/2020   HCT 43.8 08/25/2020   MCV 88.0 08/25/2020   PLT 148 (L) 08/25/2020    Lab Results  Component Value Date   TSH 5.577 (H) 04/17/2021    Radiographic Findings: No results found.   Impression/Plan:    1) Head and Neck Cancer Status: No evidence of disease   Continue to follow with physical exams, imaging only as clinically indicated  2) Nutritional Status PEG tube removed - weight gain is bothersome to him despite restricting calories - see #6  Filed Weights   04/17/21 1359  Weight: 246 lb (111.6 kg)      3)  Swallowing: Functional, continue speech-language pathology exercises  4) Dental - He is edentulous and pleased with dentures made by Dr Alda Berthold    5) Thyroid function: Free T4 is WNL today but TSH has been elevated for 3 past test and he has  unexplained weight gain. Will try levothyroxine 25 mcg daily and he will f/u with labs in medonc. Dr. Al Pimple notifed. Lab Results  Component Value Date   TSH 5.577 (H) 04/17/2021   6) Continue f/u with ENT and med/onc. As he is doing so well, I will see him PRN. He is consider cured.  On date of service, in total, I spent 30 minutes on this encounter.  This includes review of images personally, reviewing and adding to documentation, and discussing all of the issues above with the patient.  He was seen face-to-face. _____________________________________   Lonie Peak, MD

## 2021-04-21 ENCOUNTER — Other Ambulatory Visit: Payer: Self-pay

## 2021-04-21 ENCOUNTER — Telehealth: Payer: Self-pay

## 2021-04-21 NOTE — Telephone Encounter (Signed)
Called and spoke with patient directly regarding recent lab work results. Informed him of new levothyroxine prescription, and instructions on how to take. Relayed that Dr. Chryl Heck would recheck his thyroid levels when he saw her in June to see if dosage needed to be adjusted. Patient verbalized understanding and appreciation of call. No other needs identified at this time, but patient knows he can reach out directly should something arise.

## 2021-04-21 NOTE — Telephone Encounter (Signed)
-----   Message from Eppie Gibson, MD sent at 04/20/2021  4:24 PM EST ----- Althia Forts, would you please let patient know that he appears to have early/mild subclinical hypothyroidism, and I would like to try a low dose of levothyroxine to see if this helps stop his weight gain?  Rx made to his pharmacy on [address] in [place]. Take 1 tab PO QAM on empty stomach with water; wait 1 hour before food, vitamins, or other meds.  And, can you order repeat Free T4 and TSH prior to his appt w/ Dr Chryl Heck when she sees him in June?  Praveena, if you can continue his refills as appropriate, I would greatly appreciate it! Thanks!   Sarah  ----- Message ----- From: Buel Ream, [facility] Sent: 04/17/2021   3:31 PM EST To: Eppie Gibson, MD

## 2021-04-28 ENCOUNTER — Other Ambulatory Visit: Payer: Self-pay | Admitting: Cardiovascular Disease

## 2021-05-12 ENCOUNTER — Other Ambulatory Visit: Payer: Self-pay | Admitting: Cardiovascular Disease

## 2021-05-12 DIAGNOSIS — I4891 Unspecified atrial fibrillation: Secondary | ICD-10-CM

## 2021-05-12 DIAGNOSIS — Z5181 Encounter for therapeutic drug level monitoring: Secondary | ICD-10-CM

## 2021-05-12 NOTE — Telephone Encounter (Signed)
Prescription refill request for Eliquis received. ?Indication: Afib  ?Last office visit: 07/07/20 ([facility])  ?Scr:1.09 (11/19/20)  ?Age: 76 ?Weight: 111.6kg ? ?Appropriate dose and refill sent to requested pharmacy.  ?

## 2021-05-14 ENCOUNTER — Telehealth: Payer: Self-pay | Admitting: Hematology and Oncology

## 2021-05-14 NOTE — Telephone Encounter (Signed)
Rescheduled appointment per providers. Patient aware.  ? ?

## 2021-06-03 ENCOUNTER — Encounter (HOSPITAL_COMMUNITY): Payer: Medicare HMO | Admitting: Dentistry

## 2021-06-30 ENCOUNTER — Telehealth: Payer: Self-pay | Admitting: Hematology and Oncology

## 2021-06-30 NOTE — Telephone Encounter (Signed)
Rescheduled appointment per providers template. Left message.  ? ?

## 2021-07-16 ENCOUNTER — Other Ambulatory Visit: Payer: Self-pay | Admitting: Family Medicine

## 2021-07-16 DIAGNOSIS — I739 Peripheral vascular disease, unspecified: Secondary | ICD-10-CM

## 2021-07-16 DIAGNOSIS — R600 Localized edema: Secondary | ICD-10-CM

## 2021-07-17 ENCOUNTER — Ambulatory Visit
Admission: RE | Admit: 2021-07-17 | Discharge: 2021-07-17 | Disposition: A | Discharge status: Home / Self Care | Payer: Medicare HMO | Source: Ambulatory Visit | Attending: Family Medicine | Admitting: Family Medicine

## 2021-07-17 DIAGNOSIS — I739 Peripheral vascular disease, unspecified: Secondary | ICD-10-CM

## 2021-07-17 DIAGNOSIS — R600 Localized edema: Secondary | ICD-10-CM

## 2021-07-23 ENCOUNTER — Ambulatory Visit
Admission: RE | Admit: 2021-07-23 | Discharge: 2021-07-23 | Disposition: A | Discharge status: Home / Self Care | Payer: Medicare HMO | Source: Ambulatory Visit | Attending: Family Medicine | Admitting: Family Medicine

## 2021-07-23 DIAGNOSIS — I739 Peripheral vascular disease, unspecified: Secondary | ICD-10-CM

## 2021-07-23 DIAGNOSIS — R600 Localized edema: Secondary | ICD-10-CM

## 2021-08-09 ENCOUNTER — Other Ambulatory Visit: Payer: Self-pay | Admitting: Cardiovascular Disease

## 2021-08-09 DIAGNOSIS — Z5181 Encounter for therapeutic drug level monitoring: Secondary | ICD-10-CM

## 2021-08-09 DIAGNOSIS — I4891 Unspecified atrial fibrillation: Secondary | ICD-10-CM

## 2021-08-10 ENCOUNTER — Other Ambulatory Visit: Payer: Self-pay | Admitting: Oncology

## 2021-08-10 NOTE — Telephone Encounter (Signed)
Prescription refill request for Eliquis received. Indication:Afib Last office visit:needs visit Scr:1.2 Age: 76 Weight:111.6 kg  Prescription refilled

## 2021-08-13 ENCOUNTER — Inpatient Hospital Stay: Payer: Medicare HMO | Attending: Hematology and Oncology | Admitting: Hematology and Oncology

## 2021-08-13 ENCOUNTER — Other Ambulatory Visit: Payer: Self-pay

## 2021-08-13 VITALS — BP 130/83 | HR 60 | Temp 98.3°F | Resp 18 | Ht 70.0 in | Wt 248.3 lb

## 2021-08-13 DIAGNOSIS — C01 Malignant neoplasm of base of tongue: Secondary | ICD-10-CM | POA: Diagnosis not present

## 2021-08-13 DIAGNOSIS — E039 Hypothyroidism, unspecified: Secondary | ICD-10-CM | POA: Insufficient documentation

## 2021-08-13 DIAGNOSIS — Z85810 Personal history of malignant neoplasm of tongue: Secondary | ICD-10-CM | POA: Insufficient documentation

## 2021-08-13 DIAGNOSIS — Z08 Encounter for follow-up examination after completed treatment for malignant neoplasm: Secondary | ICD-10-CM | POA: Diagnosis present

## 2021-08-13 NOTE — Progress Notes (Signed)
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  R cervical LN substantially reduced in size to 0.8cm w/ SUV 4.7 (prev 1.7cm w/ SUV 5.8).  There was no new or metastatic disease.  - Imaging in June showed improving residual 5 mm short axis right level 2 cervical LN - Imaging in last December again with post treatment changes with unchanged size of a subcentimeter right level II lN.  No evidence of new or progressive metastatic disease in the neck. No concerning symptoms today are physical examination findings. We have ordered a repeat CT given the subcentimeter right level 2 lymph node which has not been scheduled, I have recommended to schedule this and if this shows no change, no further need for imaging. He can return to clinic in 1 year.  He recently saw Dr. Constance Holster from ENT and exam was negative.  Hypothyroidism, currently managed by PCP, he is on levothyroxine 50 mcg. No orders of the defined types were placed in this encounter.  [name], MD 6/8/20231:34 PM  CHIEF COMPLAINT:  Follow up for [facility] tongue   Oncology History  Cancer of base of tongue ([place])  07/29/2018 Imaging   Neck ultrasound: IMPRESSION: 16 x 30 mm solid mass in  the right submandibular area. This may represent a submandibular mass or enlarged lymph node. Recommend CT neck with contrast for further evaluation.   08/03/2018 Imaging   CT neck (at [facility]): IMPRESSION: 1. 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 There is a single ipsilateral malignant lymph node measuring 3 cm. 2. Nodular thickening along the anterior right sublingual gland, attention on follow-up PET.   09/04/2018 Procedure   US-guided bx of the R submandibular mass   09/04/2018 Pathology Results   Accession: [id number]  Lymph node, needle/core biopsy, right submandibular - SQUAMOUS CELL CARCINOMA. - SEE MICROSCOPIC DESCRIPTION.   09/11/2018 Imaging   PET: IMPRESSION: Hypermetabolism along the right base of tongue, corresponding to the patient's known primary oropharyngeal cancer.   Ipsilateral level 2 cervical nodal metastasis.   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   11/16/2018 -  Chemotherapy   The patient had dexamethasone (DECADRON) 4 MG tablet, 8 mg, Oral, Daily, 2 of 2 cycles palonosetron (ALOXI) injection 0.25 mg, 0.25 mg, Intravenous,  Once, 6 of 6 cycles Administration: 0.25 mg (11/16/2018), 0.25 mg (11/23/2018), 0.25 mg (11/30/2018), 0.25 mg (12/07/2018), 0.25 mg (12/14/2018), 0.25 mg (12/28/2018) CARBOplatin (PARAPLATIN) 200 mg in sodium chloride 0.9 % 250 mL chemo infusion, 200 mg (100 % of original dose 202.8 mg), Intravenous,  Once, 6 of 6 cycles Dose modification: 202.8 mg (original dose 202.8 mg, Cycle 1), 260 mg (original dose 202.8 mg, Cycle 6, Reason: Provider  Judgment) Administration: 200 mg (11/16/2018), 200 mg (11/23/2018), 260 mg (11/30/2018), 260 mg (12/07/2018), 260 mg (12/14/2018), 260 mg (12/28/2018)  for chemotherapy treatment.      INTERVAL HISTORY:  Mr. Schwer returns clinic for follow-up  of squamous cell carcinoma of the right base of the tongue s/p definitive chemoradiation.    Mr Seago is here for FU. Since last visit he is doing well. He continues to feel well, mild fatigue. He is  taking levothyroxine. He can eat and swallow everything, but has to eat slow and easy. Speech is good.  No change in hearing. No neuropathy. Rest of the pertinent 10 point ROS reviewed and neg.  REVIEW OF SYSTEMS:    Constitutional: ( - ) fevers, ( - )  chills , ( - ) night sweats Eyes: ( - ) blurriness of vision, ( - ) double vision, ( - ) watery eyes Ears, nose, mouth, throat, and face: ( - ) mucositis, ( - ) sore throat Respiratory: ( - ) cough, ( - ) dyspnea, ( - ) wheezes Cardiovascular: ( - ) palpitation, ( - ) chest discomfort, ( - ) lower extremity swelling Gastrointestinal:  ( - ) nausea, ( - ) heartburn, ( - ) change in bowel habits Skin: ( - ) abnormal skin rashes Lymphatics: ( - ) new lymphadenopathy, ( - ) easy bruising Neurological: ( - ) numbness, ( - ) tingling, ( - ) new weaknesses Behavioral/Psych: ( - ) mood change, ( - ) new changes  All other systems were reviewed with the patient and are negative.   I have reviewed the past medical history, past surgical history, social history and family history with the patient and they are unchanged from previous note.  ALLERGIES:  is allergic to lisinopril.  MEDICATIONS:  Current Outpatient Medications  Medication Sig Dispense Refill   hydrochlorothiazide (HYDRODIURIL) 25 MG tablet TAKE 1 TABLET BY MOUTH EVERY DAY 90 tablet 2   apixaban (ELIQUIS) 5 MG TABS tablet Take 1 tablet (5 mg total) by mouth 2 (two) times daily. Needs office visit for Eliquis refills.  Please call office 180 tablet 0   levothyroxine (SYNTHROID) 25 MCG tablet Take 1 tab PO QAM on empty stomach with water; wait 1 hour before food, vitamins, or other meds. 90 tablet 1   metoprolol succinate (TOPROL-XL) 50 MG 24 hr tablet TAKE 1 TABLET BY MOUTH DAILY. TAKE  WITH OR IMMEDIATELY FOLLOWING A MEAL. 90 tablet 3   omeprazole (PRILOSEC) 20 MG capsule Take 20 mg by mouth daily.     Probiotic Product (PROBIOTIC PO) Take by mouth.     tamsulosin (FLOMAX) 0.4 MG CAPS capsule Take 0.4 mg by mouth daily after supper.     No current facility-administered medications for this visit.    PHYSICAL EXAMINATION: ECOG PERFORMANCE STATUS: 1 - Symptomatic but completely ambulatory  Today's Vitals   08/13/21 1317  BP: 130/83  Pulse: 60  Resp: 18  Temp: 98.3 F (36.8 C)  TempSrc: Temporal  SpO2: 99%  Weight: 248 lb 4.8 oz (112.6 kg)  Height: '5\' 10"'$  (1.778 m)   Body mass index is 35.63 kg/m.  Filed Weights   08/13/21 1317  Weight: 248 lb 4.8 oz (112.6 kg)    Physical Exam Constitutional:      Appearance: Normal appearance. He is normal weight.  HENT:     Head: Normocephalic and atraumatic.  Cardiovascular:     Comments: Irregularly irregular Pulmonary:     Effort: Pulmonary effort is normal.  Breath sounds: Normal breath sounds.  Musculoskeletal:        General: Swelling (Chronic venous stasis changes and 1+ to 2+ bilateral lower extremity swelling) present.     Cervical back: No rigidity or tenderness.  Neurological:     Mental Status: He is alert.      LABORATORY DATA:  I have reviewed the data as listed    Component Value Date/Time   NA 140 08/25/2020 1524   K 3.3 (L) 08/25/2020 1524   CL 102 08/25/2020 1524   CO2 27 08/25/2020 1524   GLUCOSE 92 08/25/2020 1524   BUN 19 08/25/2020 1524   CREATININE 1.05 08/25/2020 1524   CALCIUM 9.0 08/25/2020 1524   PROT 6.9 08/25/2020 1524   ALBUMIN 4.1 08/25/2020 1524   AST 20 08/25/2020 1524   ALT 11 08/25/2020 1524   ALKPHOS 27 (L) 08/25/2020 1524   BILITOT 0.8 08/25/2020 1524   GFRNONAA >60 08/25/2020 1524   GFRAA >60 09/27/2019 1103    No results found for: "SPEP", "UPEP"  Lab Results  Component Value Date   WBC 4.2 08/25/2020   NEUTROABS 2.7 08/25/2020   HGB 15.2  08/25/2020   HCT 43.8 08/25/2020   MCV 88.0 08/25/2020   PLT 148 (L) 08/25/2020      Chemistry      Component Value Date/Time   NA 140 08/25/2020 1524   K 3.3 (L) 08/25/2020 1524   CL 102 08/25/2020 1524   CO2 27 08/25/2020 1524   BUN 19 08/25/2020 1524   CREATININE 1.05 08/25/2020 1524      Component Value Date/Time   CALCIUM 9.0 08/25/2020 1524   ALKPHOS 27 (L) 08/25/2020 1524   AST 20 08/25/2020 1524   ALT 11 08/25/2020 1524   BILITOT 0.8 08/25/2020 1524     Reviewed pertinent imaging  RADIOGRAPHIC STUDIES: I have personally reviewed the radiological images as listed below and agreed with the findings in the report. US ARTERIAL ABI (SCREENING LOWER EXTREMITY)  Result Date: 07/23/2021 CLINICAL DATA:  76 year old male with a history of peripheral arterial disease EXAM: NONINVASIVE PHYSIOLOGIC VASCULAR STUDY OF BILATERAL LOWER EXTREMITIES TECHNIQUE: Evaluation of both lower extremities was performed at rest, including calculation of ankle-brachial indices, multiple segmental pressure evaluation, segmental Doppler and segmental pulse volume recording, with directed duplex. COMPARISON:  None Available. FINDINGS: Right ABI:  1.04 Left ABI:  1.08 Right Lower Extremity: Segmental Doppler at the right ankle demonstrates monophasic posterior tibial artery and triphasic anterior tibial artery Directed duplex of the right lower extremity demonstrates triphasic common femoral artery, SFA, profunda femoris. Popliteal artery patent with high resistance waveform. Left Lower Extremity: Segmental Doppler at the left ankle demonstrates monophasic posterior tibial artery and triphasic anterior tibial artery Directed duplex right lower extremity demonstrates triphasic common femoral artery, profunda femoris, SFA. Popliteal artery patent with biphasic waveform. Triphasic anterior tibial artery and monophasic posterior tibial artery. IMPRESSION: Right: Resting ABI within normal limits. Value might decrease  after exercise exam. The segmental and duplex exam demonstrates early tibial arterial disease, predominantly posterior tibial artery. Left: Resting ABI within normal limits. Value might decrease after exercise exam. Segmental and duplex exam demonstrates early tibial arterial disease, predominately posterior tibial artery. Signed, Dulcy Fanny. Dellia Nims, RPVI [facility] Radiology Electronically Signed   By: Corrie Mckusick D.O.   On: 07/23/2021 10:33   US ARTERIAL LOWER EXTREMITY DUPLEX BILATERAL  Result Date: 07/23/2021 CLINICAL DATA:  76 year old male with a history of peripheral arterial disease  EXAM: NONINVASIVE PHYSIOLOGIC VASCULAR STUDY OF BILATERAL LOWER EXTREMITIES TECHNIQUE: Evaluation of both lower extremities was performed at rest, including calculation of ankle-brachial indices, multiple segmental pressure evaluation, segmental Doppler and segmental pulse volume recording, with directed duplex. COMPARISON:  None Available. FINDINGS: Right ABI:  1.04 Left ABI:  1.08 Right Lower Extremity: Segmental Doppler at the right ankle demonstrates monophasic posterior tibial artery and triphasic anterior tibial artery Directed duplex of the right lower extremity demonstrates triphasic common femoral artery, SFA, profunda femoris. Popliteal artery patent with high resistance waveform. Left Lower Extremity: Segmental Doppler at the left ankle demonstrates monophasic posterior tibial artery and triphasic anterior tibial artery Directed duplex right lower extremity demonstrates triphasic common femoral artery, profunda femoris, SFA. Popliteal artery patent with biphasic waveform. Triphasic anterior tibial artery and monophasic posterior tibial artery. IMPRESSION: Right: Resting ABI within normal limits. Value might decrease after exercise exam. The segmental and duplex exam demonstrates early tibial arterial disease, predominantly posterior tibial artery. Left: Resting  ABI within normal limits. Value might decrease after exercise exam. Segmental and duplex exam demonstrates early tibial arterial disease, predominately posterior tibial artery. Signed, Dulcy Fanny. Dellia Nims, RPVI [facility] Radiology Electronically Signed   By: Corrie Mckusick D.O.   On: 07/23/2021 10:33   US Venous Img Lower Bilateral (DVT)  Result Date: 07/17/2021 CLINICAL DATA:  76 year old male with PA D and lower extremity swelling for 3 months EXAM: BILATERAL LOWER EXTREMITY VENOUS DOPPLER ULTRASOUND TECHNIQUE: Gray-scale sonography with graded compression, as well as color Doppler and duplex ultrasound were performed to evaluate the lower extremity deep venous systems from the level of the common femoral vein and including the common femoral, femoral, profunda femoral, popliteal and calf veins including the posterior tibial, peroneal and gastrocnemius veins when visible. The superficial great saphenous vein was also interrogated. Spectral Doppler was utilized to evaluate flow at rest and with distal augmentation maneuvers in the common femoral, femoral and popliteal veins. COMPARISON:  None Available. FINDINGS: RIGHT LOWER EXTREMITY Common Femoral Vein: No evidence of thrombus. Normal compressibility, respiratory phasicity and response to augmentation. Saphenofemoral Junction: No evidence of thrombus. Normal compressibility and flow on color Doppler imaging. Profunda Femoral Vein: No evidence of thrombus. Normal compressibility and flow on color Doppler imaging. Femoral Vein: No evidence of thrombus. Normal compressibility, respiratory phasicity and response to augmentation. Popliteal Vein: No evidence of thrombus. Normal compressibility, respiratory phasicity and response to augmentation. Calf Veins: Posterior tibial vein patent and compressible. Peroneal vein not visualized. Superficial Great Saphenous Vein: No evidence of thrombus. Normal compressibility  and flow on color Doppler imaging. Other Findings:  Edema Superficial venous varicosities. Borderline enlarged inguinal lymph nodes. LEFT LOWER EXTREMITY Common Femoral Vein: No evidence of thrombus. Normal compressibility, respiratory phasicity and response to augmentation. Saphenofemoral Junction: No evidence of thrombus. Normal compressibility and flow on color Doppler imaging. Profunda Femoral Vein: No evidence of thrombus. Normal compressibility and flow on color Doppler imaging. Femoral Vein: No evidence of thrombus. Normal compressibility, respiratory phasicity and response to augmentation. Popliteal Vein: No evidence of thrombus. Normal compressibility, respiratory phasicity and response to augmentation. Calf Veins: Posterior tibial vein patent and compressible. Peroneal vein not visualized. Superficial Great Saphenous Vein: No evidence of thrombus. Normal compressibility and flow on color Doppler imaging. Other Findings:  Edema Borderline enlarged inguinal lymph nodes. IMPRESSION: Directed duplex of the bilateral lower extremity negative for DVT. Bilateral lower extremity edema. Borderline enlarged lymph nodes of the inguinal region bilaterally, potentially reactive though nonspecific. Electronically Signed  By: Corrie Mckusick D.O.   On: 07/17/2021 14:07     [name] MD

## 2021-08-20 ENCOUNTER — Ambulatory Visit: Payer: Medicare HMO

## 2021-08-20 ENCOUNTER — Ambulatory Visit: Payer: Medicare HMO | Admitting: Hematology and Oncology

## 2021-08-26 ENCOUNTER — Other Ambulatory Visit: Payer: Self-pay | Admitting: *Deleted

## 2021-08-26 ENCOUNTER — Encounter (HOSPITAL_COMMUNITY): Payer: Medicare HMO | Admitting: Dentistry

## 2021-08-27 ENCOUNTER — Ambulatory Visit: Payer: Medicare HMO | Admitting: Hematology and Oncology

## 2021-08-27 ENCOUNTER — Other Ambulatory Visit: Payer: Self-pay | Admitting: *Deleted

## 2021-08-27 DIAGNOSIS — C01 Malignant neoplasm of base of tongue: Secondary | ICD-10-CM

## 2021-08-28 ENCOUNTER — Other Ambulatory Visit: Payer: Self-pay | Admitting: *Deleted

## 2021-08-28 DIAGNOSIS — M25569 Pain in unspecified knee: Secondary | ICD-10-CM

## 2021-09-02 NOTE — Progress Notes (Unsigned)
Cardiology Office Note:   Date:  09/03/2021  NAME:  Neil [name]    MRN: [id number] DOB:  03-11-1945   PCP:  Verlon Au, MD  Cardiologist:  Reatha Harps, MD  Electrophysiologist:  None   Referring MD: Verlon Au, MD   Chief Complaint  Patient presents with   Follow-up        History of Present Illness:   Neil [name] is a 76 y.o. male with a hx of permanent Afib, HTN, SCC tongue who presents for follow-up.  Recently diagnosed with hypothyroidism.  Apparently has gained weight as well.  Has struggled with lower extremity edema.  DVT study negative.  Arterial ultrasounds without significant PAD.  He will undergo a venous reflux study.  Also reports exertional shortness of breath.  Symptoms have occurred for the past 2 to 3 months.  He attributes this to his weight gain.  He does have lower extremity edema on exam.  Lungs are clear.  Heart rate in the 50s.  No symptoms.  No dizziness or lightheadedness.  No chest pain or chest tightness.  We did discuss rechecking an echocardiogram and starting Lasix.  He is okay to do this.  I have recommended he proceed with the venous reflux study.  Otherwise things are stable.  Problem List 1. Atrial fibrillation, permanent  -failed multiple cardioversions -CHADSVASC=3  2. HTN 3. SCC tongue   Past Medical History: Past Medical History:  Diagnosis Date   Atrial fibrillation (HCC)    base of throat ca dx'd 07/2017   Chronic anticoagulation    On Eliquis   GERD (gastroesophageal reflux disease)    History of radiation therapy 11/16/18- 01/03/19   Head and Neck, Base of Tongue 35 fractions of 2 Gy each for a total of 70 Gy.    Hypertension    Joint pain    Thyroid disease     Past Surgical History: Past Surgical History:  Procedure Laterality Date   HEMORROIDECTOMY  2004   HERNIA REPAIR  1960   IR GASTROSTOMY TUBE MOD SED  11/07/2018   IR GASTROSTOMY TUBE REMOVAL  05/02/2019   IR IMAGING GUIDED PORT INSERTION  11/07/2018    IR PATIENT EVAL TECH 0-60 MINS  03/30/2019   IR REMOVAL TUN ACCESS W/ PORT W/O FL MOD SED  05/02/2019   IR REPLC GASTRO/COLONIC TUBE PERCUT W/FLUORO  01/01/2019   IR US GUIDANCE  09/04/2018   IR US GUIDE BX ASP/DRAIN  09/04/2018   MOUTH BIOPSY  09/04/2018   right jaw   right testical removed     TONSILLECTOMY      Current Medications: Current Meds  Medication Sig   apixaban (ELIQUIS) 5 MG TABS tablet Take 1 tablet (5 mg total) by mouth 2 (two) times daily. Needs office visit for Eliquis refills.  Please call office   furosemide (LASIX) 20 MG tablet Take 1 tablet (20 mg total) by mouth daily.   metoprolol succinate (TOPROL-XL) 50 MG 24 hr tablet TAKE 1 TABLET BY MOUTH DAILY. TAKE WITH OR IMMEDIATELY FOLLOWING A MEAL.   omeprazole (PRILOSEC) 20 MG capsule Take 20 mg by mouth daily.   potassium chloride (KLOR-CON) 10 MEQ tablet Take 1 tablet (10 mEq total) by mouth daily.   Probiotic Product (PROBIOTIC PO) Take by mouth.   tamsulosin (FLOMAX) 0.4 MG CAPS capsule Take 0.4 mg by mouth daily after supper.   [DISCONTINUED] hydrochlorothiazide (HYDRODIURIL) 25 MG tablet TAKE 1 TABLET BY MOUTH EVERY DAY   [DISCONTINUED] levothyroxine (  SYNTHROID) 25 MCG tablet Take 1 tab PO QAM on empty stomach with water; wait 1 hour before food, vitamins, or other meds.     Allergies:    Lisinopril   Social History: Social History   Socioeconomic History   Marital status: Divorced    Spouse name: Not on file   Number of children: 1   Years of education: Not on file   Highest education level: Not on file  Occupational History   Not on file  Tobacco Use   Smoking status: Former    Packs/day: 1.00    Years: 5.00    Total pack years: 5.00    Types: Cigarettes    Quit date: 1970    Years since quitting: 53.5   Smokeless tobacco: Never  Vaping Use   Vaping Use: Never used  Substance and Sexual Activity   Alcohol use: Not Currently    Comment: none since recent diagnosis.    Drug use: No   Sexual  activity: Not on file  Other Topics Concern   Not on file  Social History Narrative   Patient is divorced with 1 daughter living in [place].   Patient with history of smoking approximately 1 pack/day for 5 years.  Patient quit in 1970.   Patient has never used smokeless tobacco.   Patient with a history of drinking wine occasionally.  None since June 2020.   Patient denies use of illicit drugs.   Social Determinants of Health   Financial Resource Strain: Not on file  Food Insecurity: Not on file  Transportation Needs: No Transportation Needs (10/17/2018)   PRAPARE - Administrator, Civil Service (Medical): No    Lack of Transportation (Non-Medical): No  Physical Activity: Not on file  Stress: Not on file  Social Connections: Not on file     Family History: The patient's family history includes Alzheimer's disease in his mother.  ROS:   All other ROS reviewed and negative. Pertinent positives noted in the HPI.     EKGs/Labs/Other Studies Reviewed:   The following studies were personally reviewed by me today:  TTE 04/05/2019  1. Left ventricular ejection fraction, by visual estimation, is 55 to  60%. The left ventricle has normal function. There is mildly increased  left ventricular hypertrophy.   2. Left ventricular diastolic function could not be evaluated.   3. The left ventricle has no regional wall motion abnormalities.   4. Global right ventricle has normal systolic function.The right  ventricular size is normal.   5. Left atrial size was moderately dilated.   6. Right atrial size was mildly dilated.   7. The mitral valve is normal in structure. Mild mitral valve  regurgitation. No evidence of mitral stenosis.   8. The tricuspid valve is normal in structure. Tricuspid valve  regurgitation is mild.   9. The aortic valve is tricuspid. Aortic valve regurgitation is mild.  Mild aortic valve sclerosis without stenosis.  10. The pulmonic valve was grossly  normal. Pulmonic valve regurgitation is  trivial.  11. Aortic dilatation noted.  12. There is mild dilatation of the ascending aorta measuring 40 mm.  13. The inferior vena cava is normal in size with greater than 50%  respiratory variability, suggesting right atrial pressure of 3 mmHg.  14. Normal LV function; mild LVH; mildly dilated ascending aorta; mild AI  and MR; biatrial enlargement.   Recent Labs: 04/17/2021: TSH 5.577   Recent Lipid Panel No results found for: "CHOL", "  TRIG", "HDL", "CHOLHDL", "VLDL", "LDLCALC", "LDLDIRECT"  Physical Exam:   VS:  BP 138/84   Pulse (!) 49   Ht 5\' 10"  (1.778 m)   Wt 251 lb 3.2 oz (113.9 kg)   SpO2 99%   BMI 36.04 kg/m    Wt Readings from Last 3 Encounters:  09/03/21 251 lb 3.2 oz (113.9 kg)  08/13/21 248 lb 4.8 oz (112.6 kg)  04/17/21 246 lb (111.6 kg)    General: Well nourished, well developed, in no acute distress Head: Atraumatic, normal size  Eyes: PEERLA, EOMI  Neck: Supple, no JVD Endocrine: No thryomegaly Cardiac: Normal S1, S2; irregular rhythm Lungs: Clear to auscultation bilaterally, no wheezing, rhonchi or rales  Abd: Soft, nontender, no hepatomegaly  Ext: 1+ pitting edema, venous insufficiency changes  Musculoskeletal: No deformities, BUE and BLE strength normal and equal Skin: Warm and dry, no rashes   Neuro: Alert and oriented to person, place, time, and situation, CNII-XII grossly intact, no focal deficits  Psych: Normal mood and affect   ASSESSMENT:   Neil [name] is a 76 y.o. male who presents for the following: 1. Leg edema   2. SOB (shortness of breath)   3. Permanent atrial fibrillation (HCC)   4. Acquired thrombophilia (HCC)   5. Essential hypertension     PLAN:   1. Leg edema 2. SOB (shortness of breath) -Now with leg edema and exertional shortness of breath.  Could be weight related but we need to exclude congestive heart failure.  I would like to recheck an echocardiogram.  We will stop his HCTZ and  start him on Lasix 20 mg daily with 10 mill equivalents of potassium.  He should also elevate his legs and proceed with his venous reflux study.  Symptoms are probably more consistent with venous reflux we will make sure there has been no change in his cardiac function.  No symptoms of chest tightness.  No need for stress test at this time.  3. Permanent atrial fibrillation (HCC) 4. Acquired thrombophilia (HCC) -Rate controlled on metoprolol.  Multiple failed cardioversions.  We will just continue with rate control strategy.  No significant murmurs on exam.  Continue Eliquis 5 mg twice daily.  5. Essential hypertension -well controlled on current medications.       Disposition: Return in about 6 months (around 03/05/2022).  Medication Adjustments/Labs and Tests Ordered: Current medicines are reviewed at length with the patient today.  Concerns regarding medicines are outlined above.  Orders Placed This Encounter  Procedures   ECHOCARDIOGRAM COMPLETE   Meds ordered this encounter  Medications   furosemide (LASIX) 20 MG tablet    Sig: Take 1 tablet (20 mg total) by mouth daily.    Dispense:  90 tablet    Refill:  3   potassium chloride (KLOR-CON) 10 MEQ tablet    Sig: Take 1 tablet (10 mEq total) by mouth daily.    Dispense:  90 tablet    Refill:  3    Patient Instructions  Medication Instructions:  STOP: Hydrochlorothiazide 25 mg START: Lasix 20 mg daily START: Potassium 10 meq daily   *If you need a refill on your cardiac medications before your next appointment, please call your pharmacy*   Lab Work: None ordered today   Testing/Procedures: Your physician has requested that you have an echocardiogram. Echocardiography is a painless test that uses sound waves to create images of your heart. It provides your doctor with information about the size and shape of your heart and  how well your heart's chambers and valves are working. This procedure takes approximately one hour.  There are no restrictions for this procedure. [address]   Follow-Up: At [facility], you and your health needs are our priority.  As part of our continuing mission to provide you with exceptional heart care, we have created designated Provider Care Teams.  These Care Teams include your primary Cardiologist (physician) and Advanced Practice Providers (APPs -  Physician Assistants and Nurse Practitioners) who all work together to provide you with the care you need, when you need it.  We recommend signing up for the patient portal called "MyChart".  Sign up information is provided on this After Visit Summary.  MyChart is used to connect with patients for Virtual Visits (Telemedicine).  Patients are able to view lab/test results, encounter notes, upcoming appointments, etc.  Non-urgent messages can be sent to your provider as well.   To learn more about what you can do with MyChart, go to [email or web address].    Your next appointment:   6 month(s)  The format for your next appointment:   In Person  Provider:   Reatha Harps, MD {           Time Spent with Patient: I have spent a total of 35 minutes with patient reviewing hospital notes, telemetry, EKGs, labs and examining the patient as well as establishing an assessment and plan that was discussed with the patient.  > 50% of time was spent in direct patient care.  Signed, Lenna Gilford. Flora Lipps, MD, [facility]  [facility]  [address], [address] [place], [place] [address] [phone]  09/03/2021 12:19 PM

## 2021-09-03 ENCOUNTER — Encounter: Payer: Self-pay | Admitting: Cardiovascular Disease

## 2021-09-03 ENCOUNTER — Other Ambulatory Visit: Payer: Self-pay | Admitting: Hematology and Oncology

## 2021-09-03 ENCOUNTER — Ambulatory Visit (INDEPENDENT_AMBULATORY_CARE_PROVIDER_SITE_OTHER): Payer: Medicare HMO | Admitting: Cardiovascular Disease

## 2021-09-03 VITALS — BP 138/84 | HR 49 | Ht 70.0 in | Wt 251.2 lb

## 2021-09-03 DIAGNOSIS — R600 Localized edema: Secondary | ICD-10-CM

## 2021-09-03 DIAGNOSIS — I4821 Permanent atrial fibrillation: Secondary | ICD-10-CM

## 2021-09-03 DIAGNOSIS — D6869 Other thrombophilia: Secondary | ICD-10-CM

## 2021-09-03 DIAGNOSIS — R0602 Shortness of breath: Secondary | ICD-10-CM

## 2021-09-03 DIAGNOSIS — C01 Malignant neoplasm of base of tongue: Secondary | ICD-10-CM

## 2021-09-03 DIAGNOSIS — I10 Essential (primary) hypertension: Secondary | ICD-10-CM

## 2021-09-03 MED ORDER — POTASSIUM CHLORIDE ER 10 MEQ PO TBCR: 10.0000 meq | EXTENDED_RELEASE_TABLET | Freq: Every day | ORAL | 3 refills | Status: DC

## 2021-09-03 MED ORDER — FUROSEMIDE 20 MG PO TABS: 20.0000 mg | ORAL_TABLET | Freq: Every day | ORAL | 3 refills | Status: DC

## 2021-09-03 NOTE — Patient Instructions (Signed)
Medication Instructions:  STOP: Hydrochlorothiazide 25 mg START: Lasix 20 mg daily START: Potassium 10 meq daily   *If you need a refill on your cardiac medications before your next appointment, please call your pharmacy*   Lab Work: None ordered today   Testing/Procedures: Your physician has requested that you have an echocardiogram. Echocardiography is a painless test that uses sound waves to create images of your heart. It provides your doctor with information about the size and shape of your heart and how well your heart's chambers and valves are working. This procedure takes approximately one hour. There are no restrictions for this procedure. [address]   Follow-Up: At [facility], you and your health needs are our priority.  As part of our continuing mission to provide you with exceptional heart care, we have created designated Provider Care Teams.  These Care Teams include your primary Cardiologist (physician) and Advanced Practice Providers (APPs -  Physician Assistants and Nurse Practitioners) who all work together to provide you with the care you need, when you need it.  We recommend signing up for the patient portal called "MyChart".  Sign up information is provided on this After Visit Summary.  MyChart is used to connect with patients for Virtual Visits (Telemedicine).  Patients are able to view lab/test results, encounter notes, upcoming appointments, etc.  Non-urgent messages can be sent to your provider as well.   To learn more about what you can do with MyChart, go to [email or web address].    Your next appointment:   6 month(s)  The format for your next appointment:   In Person  Provider:   Evalina Field, MD {

## 2021-09-03 NOTE — Progress Notes (Signed)
I re entered the CT order, the previous order placed by our nursing team was placed as no cosign required, hence I was unable to sign this.  Neil [name]

## 2021-09-04 ENCOUNTER — Ambulatory Visit (HOSPITAL_COMMUNITY)
Admission: RE | Admit: 2021-09-04 | Discharge: 2021-09-04 | Disposition: A | Discharge status: Home / Self Care | Payer: Medicare HMO | Source: Ambulatory Visit | Attending: Hematology and Oncology | Admitting: Hematology and Oncology

## 2021-09-04 DIAGNOSIS — C01 Malignant neoplasm of base of tongue: Secondary | ICD-10-CM | POA: Diagnosis present

## 2021-09-04 DIAGNOSIS — N183 Chronic kidney disease, stage 3 unspecified: Secondary | ICD-10-CM | POA: Diagnosis present

## 2021-09-04 LAB — POCT I-STAT CREATININE: Creatinine, Ser: 1.3 mg/dL — ABNORMAL HIGH (ref 0.61–1.24)

## 2021-09-04 MED ORDER — IOHEXOL 300 MG/ML  SOLN
75.0000 mL | Freq: Once | INTRAMUSCULAR | Status: AC | PRN
  Administered 2021-09-04: 75 mL via INTRAVENOUS

## 2021-09-04 MED ORDER — SODIUM CHLORIDE (PF) 0.9 % IJ SOLN
INTRAMUSCULAR | Status: AC
  Filled 2021-09-04: qty 50

## 2021-09-10 ENCOUNTER — Telehealth: Payer: Self-pay | Admitting: *Deleted

## 2021-09-10 ENCOUNTER — Ambulatory Visit (HOSPITAL_COMMUNITY)
Admission: RE | Admit: 2021-09-10 | Discharge: 2021-09-10 | Disposition: A | Discharge status: Home / Self Care | Payer: Medicare HMO | Source: Ambulatory Visit | Attending: Family Medicine | Admitting: Family Medicine

## 2021-09-10 DIAGNOSIS — M25569 Pain in unspecified knee: Secondary | ICD-10-CM

## 2021-09-10 NOTE — Telephone Encounter (Addendum)
-----   Message from [name], MD sent at 09/07/2021  4:56 PM EDT ----- Can you please convey the CT results to the patient.  There appears to be no evidence of recurrent cancer.  This RN contacted pt per results of CT as well as need to increase hydration.  Pt stated appreciation of call and that he has not been drinking as much as he should and will improve his fluid intake.

## 2021-09-15 ENCOUNTER — Ambulatory Visit (INDEPENDENT_AMBULATORY_CARE_PROVIDER_SITE_OTHER): Payer: Medicare HMO | Admitting: Physician Assistant

## 2021-09-15 VITALS — BP 144/79 | HR 55 | Temp 97.0°F | Resp 16 | Ht 70.0 in | Wt 253.0 lb

## 2021-09-15 DIAGNOSIS — M7989 Other specified soft tissue disorders: Secondary | ICD-10-CM

## 2021-09-15 DIAGNOSIS — I872 Venous insufficiency (chronic) (peripheral): Secondary | ICD-10-CM | POA: Diagnosis not present

## 2021-09-15 NOTE — Progress Notes (Signed)
[facility]   Reason for referral: Swollen B leg  History of Present Illness  Neil [name] is a 76 y.o. male who presents with chief complaint: swollen leg.  Patient notes onset of swelling  for years , associated with prolonged sitting and standing.  He describes his legs as heavy and achy on a daily basis.   The patient has had no history of DVT,  DVT study negative.  Arterial ultrasounds without significant PAD , positive history of varicose vein, no history of venous stasis ulcers, no history of  Lymphedema and positive history of skin changes in lower legs.  There is  family history of venous disorders.  The patient has not used compression stockings in the past.  He has been dealing with Cancer and treatments with radiotherapy.  He also has a history of CKD, CAD and HTN.  He is managed with Eliquis for Afib.   Past Medical History:  Diagnosis Date   Atrial fibrillation ([place])    base of throat ca dx'd 07/2017   Chronic anticoagulation    On Eliquis   GERD (gastroesophageal reflux disease)    History of radiation therapy 11/16/18- 01/03/19   Head and Neck, Base of Tongue 35 fractions of 2 Gy each for a total of 70 Gy.    Hypertension    Joint pain    Thyroid disease     Past Surgical History:  Procedure Laterality Date   HEMORROIDECTOMY  2004   HERNIA REPAIR  1960   IR GASTROSTOMY TUBE MOD SED  11/07/2018   IR GASTROSTOMY TUBE REMOVAL  05/02/2019   IR IMAGING GUIDED PORT INSERTION  11/07/2018   IR PATIENT EVAL TECH 0-60 MINS  03/30/2019   IR REMOVAL TUN ACCESS W/ PORT W/O FL MOD SED  05/02/2019   IR REPLC GASTRO/COLONIC TUBE PERCUT W/FLUORO  01/01/2019   IR US GUIDANCE  09/04/2018   IR US GUIDE BX ASP/DRAIN  09/04/2018   MOUTH BIOPSY  09/04/2018   right jaw   right testical removed     TONSILLECTOMY      Social History   Socioeconomic History   Marital status: Divorced    Spouse name: Not on file   Number of children: 1   Years of education: Not  on file   Highest education level: Not on file  Occupational History   Not on file  Tobacco Use   Smoking status: Former    Packs/day: 1.00    Years: 5.00    Total pack years: 5.00    Types: Cigarettes    Quit date: 1970    Years since quitting: 53.5   Smokeless tobacco: Never  Vaping Use   Vaping Use: Never used  Substance and Sexual Activity   Alcohol use: Not Currently    Comment: none since recent diagnosis.    Drug use: No   Sexual activity: Not on file  Other Topics Concern   Not on file  Social History Narrative   Patient is divorced with 1 daughter living in [place].   Patient with history of smoking approximately 1 pack/day for 5 years.  Patient quit in 1970.   Patient has never used smokeless tobacco.   Patient with a history of drinking wine occasionally.  None since June 2020.   Patient denies use of illicit drugs.   Social Determinants of Health   Financial Resource Strain: Not on file  Food Insecurity: Not on file  Transportation Needs: No Transportation  Needs (10/17/2018)   PRAPARE - Hydrologist (Medical): No    Lack of Transportation (Non-Medical): No  Physical Activity: Not on file  Stress: Not on file  Social Connections: Not on file  Intimate Partner Violence: Not At Risk (10/17/2018)   Humiliation, Afraid, Rape, and Kick questionnaire    Fear of Current or Ex-Partner: No    Emotionally Abused: No    Physically Abused: No    Sexually Abused: No    Family History  Problem Relation Age of Onset   Alzheimer's disease Mother     Current Outpatient Medications on File Prior to Visit  Medication Sig Dispense Refill   apixaban (ELIQUIS) 5 MG TABS tablet Take 1 tablet (5 mg total) by mouth 2 (two) times daily. Needs office visit for Eliquis refills.  Please call office 180 tablet 0   furosemide (LASIX) 20 MG tablet Take 1 tablet (20 mg total) by mouth daily. 90 tablet 3   levothyroxine (SYNTHROID) 50 MCG tablet Take 50  mcg by mouth every morning.     metoprolol succinate (TOPROL-XL) 50 MG 24 hr tablet TAKE 1 TABLET BY MOUTH DAILY. TAKE WITH OR IMMEDIATELY FOLLOWING A MEAL. 90 tablet 3   omeprazole (PRILOSEC) 20 MG capsule Take 20 mg by mouth daily.     potassium chloride (KLOR-CON) 10 MEQ tablet Take 1 tablet (10 mEq total) by mouth daily. 90 tablet 3   Probiotic Product (PROBIOTIC PO) Take by mouth.     tamsulosin (FLOMAX) 0.4 MG CAPS capsule Take 0.4 mg by mouth daily after supper.     No current facility-administered medications on file prior to visit.    Allergies as of 09/15/2021 - Review Complete 09/15/2021  Allergen Reaction Noted   Lisinopril Cough 10/16/2018     ROS:   General:  No weight loss, Fever, chills  HEENT: No recent headaches, no nasal bleeding, no visual changes, no sore throat  Neurologic: No dizziness, blackouts, seizures. No recent symptoms of stroke or mini- stroke. No recent episodes of slurred speech, or temporary blindness.  Cardiac: No recent episodes of chest pain/pressure, no shortness of breath at rest.  No shortness of breath with exertion.  Denies history of atrial fibrillation or irregular heartbeat  Vascular: No history of rest pain in feet.  No history of claudication.  No history of non-healing ulcer, No history of DVT   Pulmonary: No home oxygen, no productive cough, no hemoptysis,  No asthma or wheezing  Musculoskeletal:  '[ ]'$  Arthritis, '[ ]'$  Low back pain,  '[ ]'$  Joint pain  Hematologic:No history of hypercoagulable state.  No history of easy bleeding.  No history of anemia  Gastrointestinal: No hematochezia or melena,  No gastroesophageal reflux, no trouble swallowing  Urinary: [ x] chronic Kidney disease, '[ ]'$  on HD - '[ ]'$  MWF or '[ ]'$  TTHS, '[ ]'$  Burning with urination, '[ ]'$  Frequent urination, '[ ]'$  Difficulty urinating;   Skin: No rashes  Psychological: No history of anxiety,  No history of depression  Physical Examination  Vitals:   09/15/21 1254  BP:  (!) 144/79  Pulse: (!) 55  Resp: 16  Temp: (!) 97 F (36.1 C)  SpO2: 98%  Weight: 253 lb (114.8 kg)  Height: '5\' 10"'$  (1.778 m)    Body mass index is 36.3 kg/m.  General:  Alert and oriented, no acute distress HEENT: Normal Neck: No bruit or JVD Pulmonary: Clear to auscultation bilaterally Cardiac: Regular Rate and Rhythm without  murmur Abdomen: Soft, non-tender, non-distended, no mass, no scars Skin: No rash       Extremity Pulses:  2+ radial,  femoral, dorsalis pedis, pulses bilaterally Musculoskeletal: No deformity or edema  Neurologic: Upper and lower extremity motor grossly and symmetric  DATA: Venous Reflux Times  +--------------+---------+------+-----------+------------+-------------+  RIGHT         Reflux NoRefluxReflux TimeDiameter cmsComments                               Yes                                        +--------------+---------+------+-----------+------------+-------------+  CFV                     yes   >1 second                            +--------------+---------+------+-----------+------------+-------------+  FV mid        no                                                   +--------------+---------+------+-----------+------------+-------------+  Popliteal               yes   >1 second                            +--------------+---------+------+-----------+------------+-------------+  GSV at [facility]    no                            0.84                   +--------------+---------+------+-----------+------------+-------------+  GSV prox thigh          yes    >500 ms      0.85                   +--------------+---------+------+-----------+------------+-------------+  GSV mid thigh           yes    >500 ms  0.98 / 0.18 out of fascia  +--------------+---------+------+-----------+------------+-------------+  GSV dist thighno                            0.22    out of fascia   +--------------+---------+------+-----------+------------+-------------+  GSV at knee   no                            0.20                   +--------------+---------+------+-----------+------------+-------------+  GSV prox calf no                            0.33                   +--------------+---------+------+-----------+------------+-------------+  GSV mid calf  no  0.26                   +--------------+---------+------+-----------+------------+-------------+  SSV Pop Fossa no                            0.29                   +--------------+---------+------+-----------+------------+-------------+  SSV prox calf no                            0.37                   +--------------+---------+------+-----------+------------+-------------+  SSV mid calf  no                            0.37                   +--------------+---------+------+-----------+------------+-------------+  AASV p        no                            0.28                   +--------------+---------+------+-----------+------------+-------------+      +--------------+---------+------+-----------+------------+-------------+  LEFT          Reflux NoRefluxReflux TimeDiameter cmsComments                               Yes                                        +--------------+---------+------+-----------+------------+-------------+  CFV                     yes   >1 second                            +--------------+---------+------+-----------+------------+-------------+  FV mid                  yes   >1 second                            +--------------+---------+------+-----------+------------+-------------+  Popliteal     no                                                   +--------------+---------+------+-----------+------------+-------------+  GSV at [facility]    no                            0.69                    +--------------+---------+------+-----------+------------+-------------+  GSV prox thigh          yes    >500 ms      0.79                   +--------------+---------+------+-----------+------------+-------------+  GSV mid thigh  yes    >500 ms      0.62                   +--------------+---------+------+-----------+------------+-------------+  GSV dist thigh          yes    >500 ms  0.57 / 0.21 out of fascia  +--------------+---------+------+-----------+------------+-------------+  GSV at knee   no                            0.28                   +--------------+---------+------+-----------+------------+-------------+  GSV prox calf           yes    >500 ms      0.22                   +--------------+---------+------+-----------+------------+-------------+  GSV mid calf            yes    >500 ms      0.41                   +--------------+---------+------+-----------+------------+-------------+  SSV Pop Fossa no                            0.28                   +--------------+---------+------+-----------+------------+-------------+  SSV prox calf no                            0.36                   +--------------+---------+------+-----------+------------+-------------+  SSV mid calf  no                            0.36                   +--------------+---------+------+-----------+------------+-------------+  AASV p        no                            0.29                   +--------------+---------+------+-----------+------------+-------------+         Summary:  Right:  - No evidence of deep vein thrombosis seen in the right lower extremity,  from the common femoral through the popliteal veins.  - No evidence of superficial venous reflux seen in the right short  saphenous vein.  - Venous reflux is noted in the right common femoral vein.  - Venous reflux is noted in the right greater saphenous  vein in the thigh.  - Venous reflux is noted in the right popliteal vein.     Left:  - No evidence of deep vein thrombosis seen in the left lower extremity,  from the common femoral through the popliteal veins.  - No evidence of superficial venous reflux seen in the left short  saphenous vein.  - Venous reflux is noted in the left common femoral vein.  - Venous reflux is noted in the left greater saphenous vein in the thigh.  - Venous reflux is noted in the left greater saphenous vein in the calf.  - Venous reflux is noted  in the left femoral vein.      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  Positive deep reflux B.  He has brawny skin changes.  He was placed in 20-30 mm hg compression to be worn daily, elevation, exercise and he will return to our vein clinic in the future.  He wants to get over all his other pre existing medical issue before he deals with his vein issues.      Roxy Horseman PA-C [facility] [place] Office: [phone]  MD in clinic [place]

## 2021-09-18 ENCOUNTER — Ambulatory Visit (INDEPENDENT_AMBULATORY_CARE_PROVIDER_SITE_OTHER): Payer: Dental | Admitting: Dentistry

## 2021-09-18 ENCOUNTER — Encounter (HOSPITAL_COMMUNITY): Payer: Self-pay | Admitting: Dentistry

## 2021-09-18 VITALS — BP 153/71 | HR 55 | Temp 98.0°F

## 2021-09-18 DIAGNOSIS — Y842 Radiological procedure and radiotherapy as the cause of abnormal reaction of the patient, or of later complication, without mention of misadventure at the time of the procedure: Secondary | ICD-10-CM

## 2021-09-18 DIAGNOSIS — Z923 Personal history of irradiation: Secondary | ICD-10-CM | POA: Insufficient documentation

## 2021-09-18 DIAGNOSIS — K117 Disturbances of salivary secretion: Secondary | ICD-10-CM

## 2021-09-18 DIAGNOSIS — Z0120 Encounter for dental examination and cleaning without abnormal findings: Secondary | ICD-10-CM

## 2021-09-18 DIAGNOSIS — K08109 Complete loss of teeth, unspecified cause, unspecified class: Secondary | ICD-10-CM | POA: Diagnosis not present

## 2021-09-18 NOTE — Progress Notes (Signed)
Department of Dental Medicine   Service Date:   09/18/2021  Patient Name:  Neil [name] Date of Birth:   1945/03/15 Medical Record Number: [id number]  Referring Provider:           Rachel Moulds, MD  TODAY'S VISIT RECALL   PROCEDURES: Periodic Exam (routine) ASSESSMENT: Soft tissue:  WNL Other findings:  Clinical xerostomia (mild) PLAN/RECOMMENDATIONS: Continue at-home denture care.  Brush tongue 1-2 times a day w/ soft toothbrush & toothpaste. Continue w/ 1 yr check-up visits. Call if any questions or concerns arise before next visit.   NEXT VISIT:  1 yr recall (09/2022) or denture adj/follow-up as needed   09/18/2021   HISTORY OF PRESENT ILLNESS: Neil [name] is a very pleasant 76 y.o. 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 Medical and dental history were reviewed with the patient.  He reports that his hypothyroidism has developed since his last dental visit, likely due to his history of head and neck radiation (per patient). He is on medication for this now and he reports that he has gained around 40-50 lbs. His goal is to lose the weight again. He reports that his complete upper and lower dentures have been doing great and he does not need to use any adhesive to keep them in. He reports good esthetics and function as well.  He does take them out at night, but will occasionally forget to because of how comfortable they are.  His last dental visit was 12/03/20 for a denture adjustment. His first exam in the hospital clinic was in 06/2020. He currently denies any dental/orofacial pain or sensitivity.  Patient is able to manage oral secretions.  Patient denies dysphagia, odynophagia, dysphonia, SOB and neck pain.  Patient denies fever, rigors and malaise.   CHIEF  COMPLAINT: Here for a 1 year recall visit; patient with no complaints.  Patient Active Problem List   Diagnosis Date Noted   Port-A-Cath in place 11/22/2018   CKD (chronic kidney disease), stage III (HCC) 10/18/2018   Cancer of base of tongue (HCC) 10/16/2018   Past Medical History:  Diagnosis Date   Atrial fibrillation ([facility])    base of throat ca dx'd 07/2017   Chronic anticoagulation    On Eliquis   GERD (gastroesophageal reflux disease)    History of radiation therapy 11/16/18- 01/03/19   Head and Neck, Base of Tongue 35 fractions of 2 Gy each for a total of 70 Gy.    Hypertension    Joint pain    Thyroid disease    Past Surgical History:  Procedure Laterality Date   HEMORROIDECTOMY  2004   HERNIA REPAIR  1960   IR GASTROSTOMY TUBE MOD SED  11/07/2018   IR GASTROSTOMY TUBE REMOVAL  05/02/2019   IR IMAGING GUIDED PORT INSERTION  11/07/2018   IR PATIENT EVAL TECH 0-60 MINS  03/30/2019   IR REMOVAL TUN ACCESS W/ PORT W/O FL MOD SED  05/02/2019   IR REPLC GASTRO/COLONIC TUBE PERCUT W/FLUORO  01/01/2019   IR US GUIDANCE  09/04/2018   IR US GUIDE BX ASP/DRAIN  09/04/2018   MOUTH BIOPSY  09/04/2018   right jaw   right testical removed     TONSILLECTOMY     Allergies  Allergen Reactions   Lisinopril Cough   Current Outpatient Medications  Medication Sig Dispense Refill  apixaban (ELIQUIS) 5 MG TABS tablet Take 1 tablet (5 mg total) by mouth 2 (two) times daily. Needs office visit for Eliquis refills.  Please call office 180 tablet 0   furosemide (LASIX) 20 MG tablet Take 1 tablet (20 mg total) by mouth daily. 90 tablet 3   levothyroxine (SYNTHROID) 50 MCG tablet Take 50 mcg by mouth every morning.     metoprolol succinate (TOPROL-XL) 50 MG 24 hr tablet TAKE 1 TABLET BY MOUTH DAILY. TAKE WITH OR IMMEDIATELY FOLLOWING A MEAL. 90 tablet 3   omeprazole (PRILOSEC) 20 MG capsule Take 20 mg by mouth daily.     potassium chloride (KLOR-CON) 10 MEQ tablet Take 1 tablet (10 mEq total) by  mouth daily. 90 tablet 3   Probiotic Product (PROBIOTIC PO) Take by mouth.     tamsulosin (FLOMAX) 0.4 MG CAPS capsule Take 0.4 mg by mouth daily after supper.     No current facility-administered medications for this visit.    LABS: Lab Results  Component Value Date   WBC 4.2 08/25/2020   HGB 15.2 08/25/2020   HCT 43.8 08/25/2020   MCV 88.0 08/25/2020   PLT 148 (L) 08/25/2020      Component Value Date/Time   NA 140 08/25/2020 1524   K 3.3 (L) 08/25/2020 1524   CL 102 08/25/2020 1524   CO2 27 08/25/2020 1524   GLUCOSE 92 08/25/2020 1524   BUN 19 08/25/2020 1524   CREATININE 1.30 (H) 09/04/2021 1211   CREATININE 1.05 08/25/2020 1524   CALCIUM 9.0 08/25/2020 1524   GFRNONAA >60 08/25/2020 1524   GFRAA >60 09/27/2019 1103   Lab Results  Component Value Date   INR 1.0 05/02/2019   INR 1.1 11/07/2018   INR 0.9 09/04/2018   No results found for: "PTT"  Social History   Socioeconomic History   Marital status: Divorced    Spouse name: Not on file   Number of children: 1   Years of education: Not on file   Highest education level: Not on file  Occupational History   Not on file  Tobacco Use   Smoking status: Former    Packs/day: 1.00    Years: 5.00    Total pack years: 5.00    Types: Cigarettes    Quit date: 1970    Years since quitting: 53.5   Smokeless tobacco: Never  Vaping Use   Vaping Use: Never used  Substance and Sexual Activity   Alcohol use: Not Currently    Comment: none since recent diagnosis.    Drug use: No   Sexual activity: Not on file  Other Topics Concern   Not on file  Social History Narrative   Patient is divorced with 1 daughter living in [place].   Patient with history of smoking approximately 1 pack/day for 5 years.  Patient quit in 1970.   Patient has never used smokeless tobacco.   Patient with a history of drinking wine occasionally.  None since June 2020.   Patient denies use of illicit drugs.   Social Determinants of  Health   Financial Resource Strain: Not on file  Food Insecurity: Not on file  Transportation Needs: No Transportation Needs (10/17/2018)   PRAPARE - Administrator, Civil Service (Medical): No    Lack of Transportation (Non-Medical): No  Physical Activity: Not on file  Stress: Not on file  Social Connections: Not on file  Intimate Partner Violence: Not At Risk (10/17/2018)   Humiliation, Afraid, Rape,  and Kick questionnaire    Fear of Current or Ex-Partner: No    Emotionally Abused: No    Physically Abused: No    Sexually Abused: No   Family History  Problem Relation Age of Onset   Alzheimer's disease Mother     REVIEW OF SYSTEMS:  Reviewed with the patient as per HPI. Psych:  Patient denies having dental phobia.   VITAL SIGNS: BP (!) 153/71 (BP Location: Right Arm, Patient Position: Sitting, Cuff Size: Normal)   Pulse (!) 55   Temp 98 F (36.7 C) (Oral)    PHYSICAL EXAM:  Soft tissue exam completed and charted.  General:  Well-developed, comfortable and in no apparent distress. Neurological:  Alert and oriented to person, place and  time. Extraoral:  Head size, head shape, facial symmetry, skin, complexion, pigmentation, conjunctiva, oral labia, parotid gland, submandibular gland, thyroid, cervical and preauricular lymph nodes, submandibular and submental lymph nodes, tonsillar and occipital lymph nodes and supraclavicular lymph nodes WNL. No swelling or lymphadenopathy.  TMJ asymptomatic without clicks or crepitations. Intraoral:  Soft tissues appear well-perfused and mucous membranes moist.  FOM and vestibules soft and not raised. Oral cavity without mass or lesion. No signs of infection, edema or erythema evident upon exam.  [+] Mucous membranes:  Mild dry mouth noted on clinical exam   DENTAL EXAM:  Hard tissue exam completed and any changes were charted.        HARD TISSUE:  Missing teeth:  Completely edentulous Removable Prosthodontics:  Existing  maxillary and mandibular complete dentures   RADIOGRAPHIC EXAM:  None taken at today's visit.   ASSESSMENT:  1.  Periodic dental exam 2.  History of base of tongue cancer 3.  History of radiation to the head and neck region 4.  Complete edentulism 5.   Xerostomia due to radiotherapy   PLAN AND RECOMMENDATIONS: I explained all significant findings of the dental exam with the patient including some debris build-up on his tongue and mild dry mouth (which patient is well-aware of and continues to do well managing this side-effect of radiation), and the recommended care including continuing at-home denture care (removing dentures at night) and brushing his tongue 1-2 times a day with a soft tooth brush and toothpaste.  He verbalized understanding of all findings, discussion, recommendations and is agreeable to the plan.  Continue 1 year recall visits OR denture adjustment(s) as needed Call if questions or concerns arise before next visit.   NEXT VISIT:  Annual recall (09/2022) or denture adjustment/follow-up as needed  All questions and concerns were invited and addressed.  The patient tolerated today's visit well and departed in stable condition.    - Meryem Haertel B. Chales Salmon, DMD

## 2021-09-21 ENCOUNTER — Ambulatory Visit (HOSPITAL_COMMUNITY): Payer: Medicare HMO | Attending: Internal Medicine

## 2021-09-21 DIAGNOSIS — R600 Localized edema: Secondary | ICD-10-CM | POA: Insufficient documentation

## 2021-09-21 DIAGNOSIS — R0602 Shortness of breath: Secondary | ICD-10-CM | POA: Diagnosis present

## 2021-09-21 LAB — ECHOCARDIOGRAM COMPLETE
P 1/2 time: 933 msec
S' Lateral: 2.6 cm

## 2021-09-24 ENCOUNTER — Encounter: Payer: Self-pay | Admitting: Physician Assistant

## 2021-11-26 ENCOUNTER — Other Ambulatory Visit: Payer: Self-pay | Admitting: Cardiovascular Disease

## 2021-11-26 ENCOUNTER — Other Ambulatory Visit: Payer: Self-pay

## 2021-11-26 DIAGNOSIS — I4891 Unspecified atrial fibrillation: Secondary | ICD-10-CM

## 2021-11-26 DIAGNOSIS — Z5181 Encounter for therapeutic drug level monitoring: Secondary | ICD-10-CM

## 2021-11-26 MED ORDER — APIXABAN 5 MG PO TABS: 5.0000 mg | ORAL_TABLET | Freq: Two times a day (BID) | ORAL | 1 refills | Status: DC

## 2021-11-26 NOTE — Telephone Encounter (Signed)
Refill sent by Stormy Fabian, CMA

## 2021-12-16 ENCOUNTER — Ambulatory Visit: Payer: Medicare HMO | Admitting: Vascular Surgery

## 2022-02-26 ENCOUNTER — Ambulatory Visit: Payer: Medicare HMO | Admitting: Cardiovascular Disease

## 2022-03-14 NOTE — Progress Notes (Unsigned)
Cardiology Office Note:   Date:  03/15/2022  NAME:  Neil [name]    MRN: [id number] DOB:  [birth year]   PCP:  Bartholome Bill, MD  Cardiologist:  Evalina Field, MD  Electrophysiologist:  None   Referring MD: Bartholome Bill, MD   Chief Complaint  Patient presents with   Follow-up    History of Present Illness:   Neil [name] is a 77 y.o. male with a hx of Afib, HTN, obesity who presents for follow-up.  His weights have increased roughly 4 pound since her last visit.  His echo was inconsistent with a diagnosis of diastolic heart failure.  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  He has had vascular ultrasounds which demonstrate venous insufficiency.  He also has evidence of varicose veins.  The Lasix does help.  We discussed increasing it to 40 mg daily for 3 days and then resuming 20 mg daily.  We also discussed compression stockings as well as salt reduction.  He is eating a lot of canned vegetables and canned meats.  We also discussed leg elevation.  He reports no chest pain or trouble breathing.  He is working on trying to lose weight.  He has been unsuccessful to date.  He is unaware of his A-fib.  Overall seems to be without major complaints other than persistent lower extremity edema.  Problem List 1. Atrial fibrillation, permanent  -failed multiple cardioversions, rate control strategy recommended  -CHADSVASC=3  2. HTN 3. SCC tongue  4. Venous insufficiency   Past Medical History: Past Medical History:  Diagnosis Date   Atrial fibrillation ([place])    base of throat ca dx'd 07/2017   Chronic anticoagulation    On Eliquis   GERD (gastroesophageal reflux disease)    History of radiation therapy 11/16/18- 01/03/19   Head and Neck, Base of Tongue 35 fractions of 2 Gy each for a total of 70 Gy.    Hypertension    Joint pain    Thyroid disease     Past Surgical History: Past Surgical History:  Procedure Laterality Date   HEMORROIDECTOMY  2004   [place]   IR GASTROSTOMY  TUBE MOD SED  11/07/2018   IR GASTROSTOMY TUBE REMOVAL  05/02/2019   IR IMAGING GUIDED PORT INSERTION  11/07/2018   IR PATIENT EVAL TECH 0-60 MINS  03/30/2019   IR REMOVAL TUN ACCESS W/ PORT W/O FL MOD SED  05/02/2019   IR REPLC GASTRO/COLONIC TUBE PERCUT W/FLUORO  01/01/2019   IR US GUIDANCE  09/04/2018   IR US GUIDE BX ASP/DRAIN  09/04/2018   MOUTH BIOPSY  09/04/2018   right jaw   right testical removed     TONSILLECTOMY      Current Medications: Current Meds  Medication Sig   apixaban (ELIQUIS) 5 MG TABS tablet Take 1 tablet (5 mg total) by mouth 2 (two) times daily.   Cholecalciferol (VITAMIN D-3) 25 MCG (1000 UT) CAPS Take by mouth.   furosemide (LASIX) 20 MG tablet Take 1 tablet (20 mg total) by mouth daily.   levothyroxine (SYNTHROID) 50 MCG tablet Take 50 mcg by mouth every morning.   metoprolol succinate (TOPROL-XL) 50 MG 24 hr tablet TAKE 1 TABLET BY MOUTH DAILY. TAKE WITH OR IMMEDIATELY FOLLOWING A MEAL.   omeprazole (PRILOSEC) 40 MG capsule Take 40 mg by mouth daily.   potassium chloride (KLOR-CON) 10 MEQ tablet Take 1 tablet (10 mEq total) by mouth daily.   tamsulosin (FLOMAX) 0.4 MG CAPS capsule Take  0.4 mg by mouth daily after supper.   vitamin B-12 (CYANOCOBALAMIN) 500 MCG tablet Take 500 mcg by mouth daily.     Allergies:    Lisinopril   Social History: Social History   Socioeconomic History   Marital status: Divorced    Spouse name: Not on file   Number of children: 1   Years of education: Not on file   Highest education level: Not on file  Occupational History   Not on file  Tobacco Use   Smoking status: Former    Packs/day: 1.00    Years: 5.00    Total pack years: 5.00    Types: Cigarettes    Quit date: 1970    Years since quitting: 54.0   Smokeless tobacco: Never  Vaping Use   Vaping Use: Never used  Substance and Sexual Activity   Alcohol use: Not Currently    Comment: none since recent diagnosis.    Drug use: No   Sexual activity: Not on file   Other Topics Concern   Not on file  Social History Narrative   Patient is divorced with 1 daughter living in [place].   Patient with history of smoking approximately 1 pack/day for 5 years.  Patient quit in 1970.   Patient has never used smokeless tobacco.   Patient with a history of drinking wine occasionally.  None since June 2020.   Patient denies use of illicit drugs.   Social Determinants of Health   Financial Resource Strain: Not on file  Food Insecurity: Not on file  Transportation Needs: No Transportation Needs (10/17/2018)   PRAPARE - Hydrologist (Medical): No    Lack of Transportation (Non-Medical): No  Physical Activity: Not on file  Stress: Not on file  Social Connections: Not on file     Family History: The patient's family history includes Alzheimer's disease in his mother.  ROS:   All other ROS reviewed and negative. Pertinent positives noted in the HPI.     EKGs/Labs/Other Studies Reviewed:   The following studies were personally reviewed by me today:  EKG:  EKG is ordered today.  The ekg ordered today demonstrates atrial fibrillation heart rate 59, no acute ischemic changes, and was personally reviewed by me.   TTE 09/21/2021  1. Left ventricular ejection fraction, by estimation, is 65 to 70%. The  left ventricle has normal function. The left ventricle has no regional  wall motion abnormalities. There is moderate left ventricular hypertrophy.  Left ventricular diastolic  parameters are indeterminate.   2. Right ventricular systolic function is normal. The right ventricular  size is normal.   3. Left atrial size was mildly dilated.   4. The mitral valve is normal in structure. Mild mitral valve  regurgitation.   5. The aortic valve is tricuspid. Aortic valve regurgitation is mild.  Aortic valve sclerosis/calcification is present, without any evidence of  aortic stenosis.   6. Aortic dilatation noted. There is mild  dilatation of the ascending  aorta, measuring 41 mm.   7. The inferior vena cava is normal in size with greater than 50%  respiratory variability, suggesting right atrial pressure of 3 mmHg.   Recent Labs: 04/17/2021: TSH 5.577 09/04/2021: Creatinine, Ser 1.30   Recent Lipid Panel No results found for: "CHOL", "TRIG", "HDL", "CHOLHDL", "VLDL", "LDLCALC", "LDLDIRECT"  Physical Exam:   VS:  BP 126/78   Pulse (!) 59   Ht '5\' 10"'$  (1.778 m)   Wt 257 lb  3.2 oz (116.7 kg)   SpO2 98%   BMI 36.90 kg/m    Wt Readings from Last 3 Encounters:  03/15/22 257 lb 3.2 oz (116.7 kg)  09/15/21 253 lb (114.8 kg)  09/03/21 251 lb 3.2 oz (113.9 kg)    General: Well nourished, well developed, in no acute distress Head: Atraumatic, normal size  Eyes: PEERLA, EOMI  Neck: Supple, no JVD Endocrine: No thryomegaly Cardiac: Normal S1, S2; irregular rhythm, no murmurs Lungs: Clear to auscultation bilaterally, no wheezing, rhonchi or rales  Abd: Soft, nontender, no hepatomegaly  Ext: 2+ pitting edema in the lower extremities Musculoskeletal: No deformities, BUE and BLE strength normal and equal Skin: Warm and dry, no rashes   Neuro: Alert and oriented to person, place, time, and situation, CNII-XII grossly intact, no focal deficits  Psych: Normal mood and affect   ASSESSMENT:   Neil [name] is a 77 y.o. male who presents for the following: 1. Permanent atrial fibrillation ([place])   2. Acquired thrombophilia ([facility])   3. Leg edema   4. Venous reflux     PLAN:   1. Permanent atrial fibrillation ([place]) 2. Acquired thrombophilia ([place]) -Rate control strategy pursued.  He has failed multiple cardioversions.  He is unaware of his A-fib.  Continue with current medications including Eliquis.  No bleeding reported.  3. Leg edema 4. Venous reflux -Echocardiogram is inconsistent with HFpEF.  Vascular studies have shown venous insufficiency.  I have instructed him to increase his Lasix to 40 mg daily for 3 days due  to worsening lower extremity edema.  His weight is up roughly 4 pounds from her last visit.  We will check a BMP today.  After 3 days he will resume 20 mg of Lasix daily.  We discussed compression stockings as well as leg elevation.  We also discussed salt reduction.  Would recommend a conservative approach to treat his venous insufficiency.  He is in agreement.  He has seen a vein doctor for possible ablation of his varicose veins.  I did inform him that this will not help his overall venous insufficiency.  We also discussed weight loss.  He will work on this.      Disposition: Return in about 6 months (around 09/13/2022).  Medication Adjustments/Labs and Tests Ordered: Current medicines are reviewed at length with the patient today.  Concerns regarding medicines are outlined above.  Orders Placed This Encounter  Procedures   Basic metabolic panel   Brain natriuretic peptide   EKG 12-Lead   No orders of the defined types were placed in this encounter.   Patient Instructions  Medication Instructions:  Increase Lasix to 40 mg for 3 days, then decrease to 20 mg daily  *If you need a refill on your cardiac medications before your next appointment, please call your pharmacy*   Lab Work: BMET, BNP today   If you have labs (blood work) drawn today and your tests are completely normal, you will receive your results only by: [place] (if you have MyChart) OR A paper copy in the mail If you have any lab test that is abnormal or we need to change your treatment, we will call you to review the results.   Follow-Up: At [facility], you and your health needs are our priority.  As part of our continuing mission to provide you with exceptional heart care, we have created designated Provider Care Teams.  These Care Teams include your primary Cardiologist (physician) and Advanced Practice Providers (APPs -  Physician Assistants and Nurse Practitioners) who all work together to  provide you with the care you need, when you need it.  We recommend signing up for the patient portal called "MyChart".  Sign up information is provided on this After Visit Summary.  MyChart is used to connect with patients for Virtual Visits (Telemedicine).  Patients are able to view lab/test results, encounter notes, upcoming appointments, etc.  Non-urgent messages can be sent to your provider as well.   To learn more about what you can do with MyChart, go to [email or web address].    Your next appointment:   6 month(s)  The format for your next appointment:   In Person  Provider:   Evalina Field, MD        Chronic Venous Insufficiency Chronic venous insufficiency is a condition where the leg veins cannot effectively pump blood from the legs to the heart. This happens when the vein walls are either stretched, weakened, or damaged, or when the valves inside the vein are damaged. With the right treatment, you should be able to continue with an active life. This condition is also called venous stasis. What are the causes? Common causes of this condition include: High blood pressure inside the veins (venous hypertension). Sitting or standing too long, causing increased blood pressure in the leg veins. A blood clot that blocks blood flow in a vein (deep vein thrombosis, DVT). Inflammation of a vein (phlebitis) that causes a blood clot to form. Tumors in the pelvis that cause blood to back up. What increases the risk? The following factors may make you more likely to develop this condition: Having a family history of this condition. Obesity. Pregnancy. Living without enough regular physical activity or exercise (sedentary lifestyle). Smoking. Having a job that requires long periods of standing or sitting in one place. Being a certain age. Women in their 24s and 47s and men in their 14s are more likely to develop this condition. What are the signs or symptoms? Symptoms of this  condition include: Veins that are enlarged, bulging, or twisted (varicose veins). Skin breakdown or ulcers. Reddened skin or dark discoloration of skin on the leg between the knee and ankle. Brown, smooth, tight, and painful skin just above the ankle, usually on the inside of the leg (lipodermatosclerosis). Swelling of the legs. How is this diagnosed? This condition may be diagnosed based on: Your medical history. A physical exam. Tests, such as: A procedure that creates an image of a blood vessel and nearby organs and provides information about blood flow through the blood vessel (duplex ultrasound). A procedure that tests blood flow (plethysmography). A procedure that looks at the veins using X-ray and dye (venogram). How is this treated? The goals of treatment are to help you return to an active life and to minimize pain or disability. Treatment depends on the severity of your condition, and it may include: Wearing compression stockings. These can help relieve symptoms and help prevent your condition from getting worse. However, they do not cure the condition. Sclerotherapy. This procedure involves an injection of a solution that shrinks damaged veins. Surgery. This may involve: Removing a diseased vein (vein stripping). Cutting off blood flow through the vein (laser ablation surgery). Repairing or reconstructing a valve within the affected vein. Follow these instructions at home:     Wear compression stockings as told by your health care provider. These stockings help to prevent blood clots and reduce swelling in your legs. Take over-the-counter and prescription medicines only as  told by your health care provider. Stay active by exercising, walking, or doing different activities. Ask your health care provider what activities are safe for you and how much exercise you need. Drink enough fluid to keep your urine pale yellow. Do not use any products that contain nicotine or tobacco,  such as cigarettes, e-cigarettes, and chewing tobacco. If you need help quitting, ask your health care provider. Keep all follow-up visits as told by your health care provider. This is important. Contact a health care provider if you: Have redness, swelling, or more pain in the affected area. See a red streak or line that goes up or down from the affected area. Have skin breakdown or skin loss in the affected area, even if the breakdown is small. Get an injury in the affected area. Get help right away if: You get an injury and an open wound in the affected area. You have: Severe pain that does not get better with medicine. Sudden numbness or weakness in the foot or ankle below the affected area. Trouble moving your foot or ankle. A fever. Worse or persistent symptoms. Chest pain. Shortness of breath. Summary Chronic venous insufficiency is a condition where the leg veins cannot effectively pump blood from the legs to the heart. Chronic venous insufficiency occurs when the vein walls become stretched, weakened, or damaged, or when valves within the vein are damaged. Treatment depends on how severe your condition is. It often involves wearing compression stockings and may involve having a procedure. Make sure you stay active by exercising, walking, or doing different activities. Ask your health care provider what activities are safe for you and how much exercise you need. This information is not intended to replace advice given to you by your health care provider. Make sure you discuss any questions you have with your health care provider. Document Revised: 05/06/2020 Document Reviewed: 05/06/2020 Elsevier Patient Education  [place] with Patient: I have spent a total of 35 minutes with patient reviewing hospital notes, telemetry, EKGs, labs and examining the patient as well as establishing an assessment and plan that was discussed with the patient.  > 50% of  time was spent in direct patient care.  Signed, Addison Naegeli. Audie Box, MD, [place]  [address] [place], [place] [address] [phone]  03/15/2022 5:57 PM

## 2022-03-15 ENCOUNTER — Ambulatory Visit: Payer: Medicare HMO | Attending: Cardiovascular Disease | Admitting: Cardiovascular Disease

## 2022-03-15 ENCOUNTER — Encounter: Payer: Self-pay | Admitting: Cardiovascular Disease

## 2022-03-15 VITALS — BP 126/78 | HR 59 | Ht 70.0 in | Wt 257.2 lb

## 2022-03-15 DIAGNOSIS — I872 Venous insufficiency (chronic) (peripheral): Secondary | ICD-10-CM

## 2022-03-15 DIAGNOSIS — D6869 Other thrombophilia: Secondary | ICD-10-CM | POA: Diagnosis not present

## 2022-03-15 DIAGNOSIS — I4821 Permanent atrial fibrillation: Secondary | ICD-10-CM | POA: Diagnosis not present

## 2022-03-15 DIAGNOSIS — R0602 Shortness of breath: Secondary | ICD-10-CM

## 2022-03-15 DIAGNOSIS — R600 Localized edema: Secondary | ICD-10-CM

## 2022-03-15 NOTE — Patient Instructions (Addendum)
Medication Instructions:  Increase Lasix to 40 mg for 3 days, then decrease to 20 mg daily  *If you need a refill on your cardiac medications before your next appointment, please call your pharmacy*   Lab Work: BMET, BNP today   If you have labs (blood work) drawn today and your tests are completely normal, you will receive your results only by: [place] (if you have MyChart) OR A paper copy in the mail If you have any lab test that is abnormal or we need to change your treatment, we will call you to review the results.   Follow-Up: At [facility], you and your health needs are our priority.  As part of our continuing mission to provide you with exceptional heart care, we have created designated Provider Care Teams.  These Care Teams include your primary Cardiologist (physician) and Advanced Practice Providers (APPs -  Physician Assistants and Nurse Practitioners) who all work together to provide you with the care you need, when you need it.  We recommend signing up for the patient portal called "MyChart".  Sign up information is provided on this After Visit Summary.  MyChart is used to connect with patients for Virtual Visits (Telemedicine).  Patients are able to view lab/test results, encounter notes, upcoming appointments, etc.  Non-urgent messages can be sent to your provider as well.   To learn more about what you can do with MyChart, go to [email or web address].    Your next appointment:   6 month(s)  The format for your next appointment:   In Person  Provider:   Evalina Field, MD        Chronic Venous Insufficiency Chronic venous insufficiency is a condition where the leg veins cannot effectively pump blood from the legs to the heart. This happens when the vein walls are either stretched, weakened, or damaged, or when the valves inside the vein are damaged. With the right treatment, you should be able to continue with an active life. This condition is  also called venous stasis. What are the causes? Common causes of this condition include: High blood pressure inside the veins (venous hypertension). Sitting or standing too long, causing increased blood pressure in the leg veins. A blood clot that blocks blood flow in a vein (deep vein thrombosis, DVT). Inflammation of a vein (phlebitis) that causes a blood clot to form. Tumors in the pelvis that cause blood to back up. What increases the risk? The following factors may make you more likely to develop this condition: Having a family history of this condition. Obesity. Pregnancy. Living without enough regular physical activity or exercise (sedentary lifestyle). Smoking. Having a job that requires long periods of standing or sitting in one place. Being a certain age. Women in their 49s and 63s and men in their 16s are more likely to develop this condition. What are the signs or symptoms? Symptoms of this condition include: Veins that are enlarged, bulging, or twisted (varicose veins). Skin breakdown or ulcers. Reddened skin or dark discoloration of skin on the leg between the knee and ankle. Brown, smooth, tight, and painful skin just above the ankle, usually on the inside of the leg (lipodermatosclerosis). Swelling of the legs. How is this diagnosed? This condition may be diagnosed based on: Your medical history. A physical exam. Tests, such as: A procedure that creates an image of a blood vessel and nearby organs and provides information about blood flow through the blood vessel (duplex ultrasound). A procedure that tests blood  flow (plethysmography). A procedure that looks at the veins using X-ray and dye (venogram). How is this treated? The goals of treatment are to help you return to an active life and to minimize pain or disability. Treatment depends on the severity of your condition, and it may include: Wearing compression stockings. These can help relieve symptoms and help  prevent your condition from getting worse. However, they do not cure the condition. Sclerotherapy. This procedure involves an injection of a solution that shrinks damaged veins. Surgery. This may involve: Removing a diseased vein (vein stripping). Cutting off blood flow through the vein (laser ablation surgery). Repairing or reconstructing a valve within the affected vein. Follow these instructions at home:     Wear compression stockings as told by your health care provider. These stockings help to prevent blood clots and reduce swelling in your legs. Take over-the-counter and prescription medicines only as told by your health care provider. Stay active by exercising, walking, or doing different activities. Ask your health care provider what activities are safe for you and how much exercise you need. Drink enough fluid to keep your urine pale yellow. Do not use any products that contain nicotine or tobacco, such as cigarettes, e-cigarettes, and chewing tobacco. If you need help quitting, ask your health care provider. Keep all follow-up visits as told by your health care provider. This is important. Contact a health care provider if you: Have redness, swelling, or more pain in the affected area. See a red streak or line that goes up or down from the affected area. Have skin breakdown or skin loss in the affected area, even if the breakdown is small. Get an injury in the affected area. Get help right away if: You get an injury and an open wound in the affected area. You have: Severe pain that does not get better with medicine. Sudden numbness or weakness in the foot or ankle below the affected area. Trouble moving your foot or ankle. A fever. Worse or persistent symptoms. Chest pain. Shortness of breath. Summary Chronic venous insufficiency is a condition where the leg veins cannot effectively pump blood from the legs to the heart. Chronic venous insufficiency occurs when the vein  walls become stretched, weakened, or damaged, or when valves within the vein are damaged. Treatment depends on how severe your condition is. It often involves wearing compression stockings and may involve having a procedure. Make sure you stay active by exercising, walking, or doing different activities. Ask your health care provider what activities are safe for you and how much exercise you need. This information is not intended to replace advice given to you by your health care provider. Make sure you discuss any questions you have with your health care provider. Document Revised: 05/06/2020 Document Reviewed: 05/06/2020 Elsevier Patient Education  [place].

## 2022-03-17 LAB — BASIC METABOLIC PANEL
BUN/Creatinine Ratio: 13 (ref 10–24)
BUN: 17 mg/dL (ref 8–27)
CO2: 21 mmol/L (ref 20–29)
Calcium: 9 mg/dL (ref 8.6–10.2)
Chloride: 107 mmol/L — ABNORMAL HIGH (ref 96–106)
Creatinine, Ser: 1.26 mg/dL (ref 0.76–1.27)
Glucose: 86 mg/dL (ref 70–99)
Potassium: 3.7 mmol/L (ref 3.5–5.2)
Sodium: 143 mmol/L (ref 134–144)
eGFR: 58 mL/min/{1.73_m2} — ABNORMAL LOW (ref >59)

## 2022-03-17 LAB — BRAIN NATRIURETIC PEPTIDE: BNP: 162 pg/mL — ABNORMAL HIGH (ref 0.0–100.0)

## 2022-03-31 ENCOUNTER — Telehealth (HOSPITAL_COMMUNITY): Payer: Self-pay

## 2022-05-07 ENCOUNTER — Other Ambulatory Visit: Payer: Self-pay | Admitting: Cardiovascular Disease

## 2022-05-23 ENCOUNTER — Other Ambulatory Visit: Payer: Self-pay | Admitting: Cardiovascular Disease

## 2022-05-23 DIAGNOSIS — Z5181 Encounter for therapeutic drug level monitoring: Secondary | ICD-10-CM

## 2022-05-23 DIAGNOSIS — I4891 Unspecified atrial fibrillation: Secondary | ICD-10-CM

## 2022-05-24 NOTE — Telephone Encounter (Signed)
Pt last saw Dr Audie Box 03/15/22, last labs 03/15/22 Creat 1.26, age 77, weight 116.7kg, based on specified criteria pt is on appropriate dosage of Eliquis 5mg  BID for afib.  Will refill rx.

## 2022-08-12 ENCOUNTER — Inpatient Hospital Stay: Payer: Medicare HMO | Attending: Hematology and Oncology | Admitting: Hematology and Oncology

## 2022-08-12 ENCOUNTER — Other Ambulatory Visit: Payer: Self-pay

## 2022-08-12 VITALS — BP 159/76 | HR 86 | Temp 97.7°F | Resp 16 | Wt 257.6 lb

## 2022-08-12 DIAGNOSIS — E039 Hypothyroidism, unspecified: Secondary | ICD-10-CM | POA: Insufficient documentation

## 2022-08-12 DIAGNOSIS — Z85810 Personal history of malignant neoplasm of tongue: Secondary | ICD-10-CM | POA: Diagnosis present

## 2022-08-12 DIAGNOSIS — C01 Malignant neoplasm of base of tongue: Secondary | ICD-10-CM

## 2022-08-12 DIAGNOSIS — Z08 Encounter for follow-up examination after completed treatment for malignant neoplasm: Secondary | ICD-10-CM | POA: Insufficient documentation

## 2022-08-12 NOTE — Progress Notes (Signed)
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  R cervical LN substantially reduced in size to 0.8cm w/ SUV 4.7 (prev 1.7cm w/ SUV 5.8).  There was no new or metastatic disease.  -Last CT soft tissue neck with contrast in June 2023 with stable posttreatment appearance of the neck, no evidence of recurrent disease. No evidence of new or progressive metastatic disease in the neck. No concerning symptoms today or physical examination findings. I instructed him to schedule a follow-up with Dr. Pollyann Kennedy since it is about 6 months from his last visit.  Overall he seems to be doing reasonably well. Hypothyroidism, currently managed by PCP, he is on levothyroxine 75 mcg. Return to clinic in 1 year or sooner as needed.  No orders of the defined types were placed in this encounter.  Rachel Moulds, MD 6/6/20241:41 PM  CHIEF COMPLAINT:  Follow up for [facility] tongue   Oncology History  Cancer of base of tongue (HCC)  07/29/2018 Imaging   Neck ultrasound: IMPRESSION: 16 x 30 mm solid mass in the right submandibular area. This may represent a submandibular mass or enlarged lymph node. Recommend CT neck with contrast for further evaluation.   08/03/2018  Imaging   CT neck (at [facility]): IMPRESSION: 1. 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 There is a single ipsilateral malignant lymph node measuring 3 cm. 2. Nodular thickening along the anterior right sublingual gland, attention on follow-up PET.   09/04/2018 Procedure   US-guided bx of the R submandibular mass   09/04/2018 Pathology Results   Accession: [id number]  Lymph node, needle/core biopsy, right submandibular - SQUAMOUS CELL CARCINOMA. - SEE MICROSCOPIC DESCRIPTION.   09/11/2018 Imaging   PET: IMPRESSION: Hypermetabolism along the right base of tongue, corresponding to the patient's known primary oropharyngeal cancer.   Ipsilateral level 2 cervical nodal metastasis.   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   11/16/2018 -  Chemotherapy   The patient had dexamethasone (DECADRON) 4 MG tablet, 8 mg, Oral, Daily, 2 of 2 cycles palonosetron (ALOXI) injection 0.25 mg, 0.25 mg, Intravenous,  Once, 6 of 6 cycles Administration: 0.25 mg (11/16/2018), 0.25 mg (11/23/2018), 0.25 mg (11/30/2018), 0.25 mg (12/07/2018), 0.25 mg (12/14/2018), 0.25 mg (12/28/2018) CARBOplatin (PARAPLATIN) 200 mg in sodium chloride 0.9 % 250 mL chemo infusion, 200 mg (100 % of original dose 202.8 mg), Intravenous,  Once, 6 of 6 cycles Dose modification: 202.8 mg (original dose 202.8 mg, Cycle 1), 260 mg (original dose 202.8 mg, Cycle 6, Reason: Provider Judgment) Administration: 200 mg (11/16/2018), 200 mg (11/23/2018), 260 mg (11/30/2018), 260 mg (12/07/2018), 260 mg (12/14/2018), 260 mg (12/28/2018)  for chemotherapy treatment.      INTERVAL HISTORY:  Neil [name] returns clinic for follow-up of squamous cell carcinoma of the right base of the tongue s/p definitive chemoradiation.    Neil [name] is here for FU. Since last visit he is doing well. He had  some issues with the hypothyroidism and now that his medicine is titrated up, he feels much better.  His mood issues have resolved.  He had his last ENT visit with Dr. Pollyann Kennedy in December 2023 and he was recommended a 37-month follow-up.  He denies any changes in swallowing.  He is trying to eat healthy.  Rest of the pertinent 10 point ROS reviewed and neg.  REVIEW OF SYSTEMS:    Constitutional: ( - ) fevers, ( - )  chills , ( - ) night sweats Eyes: ( - ) blurriness of vision, ( - ) double vision, ( - ) watery eyes Ears, nose, mouth, throat, and face: ( - ) mucositis, ( - ) sore throat Respiratory: ( - ) cough, ( - ) dyspnea, ( - ) wheezes Cardiovascular: ( - ) palpitation, ( - ) chest discomfort, ( - ) lower extremity swelling Gastrointestinal:  ( - ) nausea, ( - ) heartburn, ( - ) change in bowel habits Skin: ( - ) abnormal skin rashes Lymphatics: ( - ) new lymphadenopathy, ( - ) easy bruising Neurological: ( - ) numbness, ( - ) tingling, ( - ) new weaknesses Behavioral/Psych: ( - ) mood change, ( - ) new changes  All other systems were reviewed with the patient and are negative.   I have reviewed the past medical history, past surgical history, social history and family history with the patient and they are unchanged from previous note.  ALLERGIES:  is allergic to lisinopril.  MEDICATIONS:  Current Outpatient Medications  Medication Sig Dispense Refill   Cholecalciferol (VITAMIN D-3) 25 MCG (1000 UT) CAPS Take by mouth.     ELIQUIS 5 MG TABS tablet TAKE 1 TABLET BY MOUTH TWICE A DAY 180 tablet 1   furosemide (LASIX) 20 MG tablet Take 1 tablet (20 mg total) by mouth daily. 90 tablet 3   levothyroxine (SYNTHROID) 50 MCG tablet Take 50 mcg by mouth every morning.     metoprolol succinate (TOPROL-XL) 50 MG 24 hr tablet TAKE 1 TABLET BY MOUTH EVERY DAY WITH OR IMMEDIATELY FOLLOWING A MEAL 90 tablet 2   omeprazole (PRILOSEC) 40 MG capsule Take 40 mg by mouth daily.     potassium chloride  (KLOR-CON) 10 MEQ tablet Take 1 tablet (10 mEq total) by mouth daily. 90 tablet 3   tamsulosin (FLOMAX) 0.4 MG CAPS capsule Take 0.4 mg by mouth daily after supper.     vitamin B-12 (CYANOCOBALAMIN) 500 MCG tablet Take 500 mcg by mouth daily.     No current facility-administered medications for this visit.    PHYSICAL EXAMINATION: ECOG PERFORMANCE STATUS: 1 - Symptomatic but completely ambulatory  Today's Vitals   08/12/22 1335  BP: (!) 159/76  Pulse: 86  Resp: 16  Temp: 97.7 F (36.5 C)  TempSrc: Temporal  SpO2: 99%  Weight: 257 lb 9.6 oz (116.8 kg)   Body mass index is 36.96 kg/m.  Filed Weights   08/12/22 1335  Weight: 257 lb 9.6 oz (116.8 kg)    Physical Exam Constitutional:      Appearance: Normal appearance. He is normal weight.  HENT:     Head: Normocephalic and atraumatic.     Mouth/Throat:     Comments: No tonsillar enlargement on oral exam. Pulmonary:  Effort: Pulmonary effort is normal.     Breath sounds: Normal breath sounds.  Musculoskeletal:        General: Swelling (Chronic venous stasis changes and 1+ to 2+ bilateral lower extremity swelling) present.     Cervical back: No rigidity or tenderness.  Lymphadenopathy:     Cervical: No cervical adenopathy.  Neurological:     Mental Status: He is alert.      LABORATORY DATA:  I have reviewed the data as listed    Component Value Date/Time   NA 143 03/15/2022 1630   K 3.7 03/15/2022 1630   CL 107 (H) 03/15/2022 1630   CO2 21 03/15/2022 1630   GLUCOSE 86 03/15/2022 1630   GLUCOSE 92 08/25/2020 1524   BUN 17 03/15/2022 1630   CREATININE 1.26 03/15/2022 1630   CREATININE 1.05 08/25/2020 1524   CALCIUM 9.0 03/15/2022 1630   PROT 6.9 08/25/2020 1524   ALBUMIN 4.1 08/25/2020 1524   AST 20 08/25/2020 1524   ALT 11 08/25/2020 1524   ALKPHOS 27 (L) 08/25/2020 1524   BILITOT 0.8 08/25/2020 1524   GFRNONAA >60 08/25/2020 1524   GFRAA >60 09/27/2019 1103    No results found for: "SPEP",  "UPEP"  Lab Results  Component Value Date   WBC 4.2 08/25/2020   NEUTROABS 2.7 08/25/2020   HGB 15.2 08/25/2020   HCT 43.8 08/25/2020   MCV 88.0 08/25/2020   PLT 148 (L) 08/25/2020      Chemistry      Component Value Date/Time   NA 143 03/15/2022 1630   K 3.7 03/15/2022 1630   CL 107 (H) 03/15/2022 1630   CO2 21 03/15/2022 1630   BUN 17 03/15/2022 1630   CREATININE 1.26 03/15/2022 1630   CREATININE 1.05 08/25/2020 1524      Component Value Date/Time   CALCIUM 9.0 03/15/2022 1630   ALKPHOS 27 (L) 08/25/2020 1524   AST 20 08/25/2020 1524   ALT 11 08/25/2020 1524   BILITOT 0.8 08/25/2020 1524      RADIOGRAPHIC STUDIES: I have personally reviewed the radiological images as listed below and agreed with the findings in the report. No results found.   Rachel Moulds MD

## 2022-08-23 ENCOUNTER — Other Ambulatory Visit: Payer: Self-pay | Admitting: Cardiovascular Disease

## 2022-08-26 ENCOUNTER — Other Ambulatory Visit: Payer: Self-pay | Admitting: Cardiovascular Disease

## 2022-09-22 ENCOUNTER — Encounter (HOSPITAL_COMMUNITY): Payer: Medicare HMO | Admitting: Dentistry

## 2022-11-24 ENCOUNTER — Other Ambulatory Visit: Payer: Self-pay | Admitting: Cardiovascular Disease

## 2022-11-24 DIAGNOSIS — I4891 Unspecified atrial fibrillation: Secondary | ICD-10-CM

## 2022-11-24 DIAGNOSIS — Z5181 Encounter for therapeutic drug level monitoring: Secondary | ICD-10-CM

## 2022-11-24 NOTE — Telephone Encounter (Signed)
Prescription refill request for Eliquis received. Indication: Afib  Last office visit: 03/15/22 (O'Neal)  Scr: 1.08 (11/19/22)  Age: 77 Weight: 116.8kg  Appropriate dose. Refill sent.

## 2022-11-25 ENCOUNTER — Other Ambulatory Visit: Payer: Self-pay | Admitting: Cardiovascular Disease

## 2022-11-25 ENCOUNTER — Telehealth: Payer: Self-pay | Admitting: Cardiovascular Disease

## 2022-11-25 DIAGNOSIS — I4891 Unspecified atrial fibrillation: Secondary | ICD-10-CM

## 2022-11-25 MED ORDER — APIXABAN 5 MG PO TABS: 5.0000 mg | ORAL_TABLET | Freq: Two times a day (BID) | ORAL | 1 refills | Status: DC

## 2022-11-25 NOTE — Telephone Encounter (Signed)
*  STAT* If patient is at the pharmacy, call can be transferred to refill team.   1. Which medications need to be refilled? (please list name of each medication and dose if known)   ELIQUIS 5 MG TABS tablet   2. Would you like to learn more about the convenience, safety, & potential cost savings by using the [facility]?   3. Are you open to using the [facility] (Type [facility]. ).  4. Which pharmacy/location (including street and city if local pharmacy) is medication to be sent to?  [id number] - [place], [place] - [address]    5. Do they need a 30 day or 90 day supply?   90 day  Patient stated he only has one tablet left.  Patient has appointment scheduled on 12/4.

## 2022-11-25 NOTE — Telephone Encounter (Signed)
Pt last saw Dr Scharlene Gloss 03/15/22, last labs 11/19/22 Creat 1.08, age 77, weight 116.8kg, based on specified criteria pt is on appropriate dosage of Eliquis 5mg  BID for afib.  Will refill rx.

## 2023-02-08 NOTE — Progress Notes (Unsigned)
Cardiology Office Note:  .   Date:  02/08/2023  ID:  Pennelope Bracken, DOB 1946/03/01, MRN [id number] PCP: Verlon Au, MD  [facility] Providers Cardiologist:  Reatha Harps, MD { Click to update primary MD,subspecialty MD or APP then REFRESH:1}   History of Present Illness: .   Neil [name] is a 77 y.o. male with history of permanent Afib, venous insufficiency who presents for follow-up.      Problem List 1. Atrial fibrillation, permanent  -failed multiple cardioversions, rate control strategy recommended  -CHADSVASC=3  2. HTN 3. SCC tongue  4. Venous insufficiency     ROS: All other ROS reviewed and negative. Pertinent positives noted in the HPI.     Studies Reviewed: Marland Kitchen        Vasc US 09/10/2021 Summary:  Right:  - No evidence of deep vein thrombosis seen in the right lower extremity,  from the common femoral through the popliteal veins.  - No evidence of superficial venous reflux seen in the right short  saphenous vein.  - Venous reflux is noted in the right common femoral vein.  - Venous reflux is noted in the right greater saphenous vein in the thigh.  - Venous reflux is noted in the right popliteal vein.    Left:  - No evidence of deep vein thrombosis seen in the left lower extremity,  from the common femoral through the popliteal veins.  - No evidence of superficial venous reflux seen in the left short  saphenous vein.  - Venous reflux is noted in the left common femoral vein.  - Venous reflux is noted in the left greater saphenous vein in the thigh.  - Venous reflux is noted in the left greater saphenous vein in the calf.  - Venous reflux is noted in the left femoral vein.   TTE 09/21/2021  1. Left ventricular ejection fraction, by estimation, is 65 to 70%. The  left ventricle has normal function. The left ventricle has no regional  wall motion abnormalities. There is moderate left ventricular hypertrophy.  Left ventricular diastolic  parameters  are indeterminate.   2. Right ventricular systolic function is normal. The right ventricular  size is normal.   3. Left atrial size was mildly dilated.   4. The mitral valve is normal in structure. Mild mitral valve  regurgitation.   5. The aortic valve is tricuspid. Aortic valve regurgitation is mild.  Aortic valve sclerosis/calcification is present, without any evidence of  aortic stenosis.   6. Aortic dilatation noted. There is mild dilatation of the ascending  aorta, measuring 41 mm.   7. The inferior vena cava is normal in size with greater than 50%  respiratory variability, suggesting right atrial pressure of 3 mmHg.  Physical Exam:   VS:  There were no vitals taken for this visit.   Wt Readings from Last 3 Encounters:  08/12/22 257 lb 9.6 oz (116.8 kg)  03/15/22 257 lb 3.2 oz (116.7 kg)  09/15/21 253 lb (114.8 kg)    GEN: Well nourished, well developed in no acute distress NECK: No JVD; No carotid bruits CARDIAC: ***RRR, no murmurs, rubs, gallops RESPIRATORY:  Clear to auscultation without rales, wheezing or rhonchi  ABDOMEN: Soft, non-tender, non-distended EXTREMITIES:  No edema; No deformity  ASSESSMENT AND PLAN: .   ***    {Are you ordering a CV Procedure (e.g. stress test, cath, DCCV, TEE, etc)?   Press F2        :[id number]}   Follow-up:  No follow-ups on file.  Time Spent with Patient: I have spent a total of *** minutes caring for this patient today face to face, ordering and reviewing labs/tests, reviewing prior records/medical history, examining the patient, establishing an assessment and plan, communicating results/findings to the patient/family, and documenting in the medical record.   Signed, Lenna Gilford. Flora Lipps, MD, [facility]  [facility]  [address], [address] [place], [place] [address] [phone]  12:13 PM

## 2023-02-09 ENCOUNTER — Ambulatory Visit: Payer: Medicare HMO | Admitting: Cardiovascular Disease

## 2023-05-16 ENCOUNTER — Other Ambulatory Visit: Payer: Self-pay | Admitting: Cardiovascular Disease

## 2023-05-16 DIAGNOSIS — I4891 Unspecified atrial fibrillation: Secondary | ICD-10-CM

## 2023-05-16 NOTE — Telephone Encounter (Signed)
 Prescription refill request for Eliquis received. Indication: AF Last office visit: 03/15/22  Carylon Perches MD Scr: 1.08 on 11/19/22  Epic Age: 78 Weight: 116.7kg  Based on above findings Eliquis 5mg  twice daily is the appropriate dose.  Refill approved x 1 only.  Pt is past due for appt with Dr Flora Lipps.  Message sent to schedulers to make appt.

## 2023-05-23 NOTE — Progress Notes (Unsigned)
 Cardiology Office Note:  .   Date:  05/26/2023  ID:  Press Casale, DOB [birth year], MRN [id number] PCP: Verlon Au, MD  [facility] Providers Cardiologist:  Reatha Harps, MD { History of Present Illness: .    Chief Complaint  Patient presents with   Follow-up         Zyshonne Malecha is a 78 y.o. male with history of Afib, venous insufficiency who presents for followup.    History of Present Illness   Neil [name] "Roe Coombs" is a 78 year old male with permanent atrial fibrillation who presents for follow-up.  He has a history of permanent atrial fibrillation and is currently on metoprolol. No chest pain and only occasional dizziness, particularly when dehydrated or standing up too quickly. He is also on Eliquis 5 mg twice daily with no significant bleeding or bruising. Recent EKG showed atrial fibrillation with a heart rate of 52 bpm.  He is currently taking Lasix at a reduced dose of 10 mg daily. He also discontinued Prilosec, which resolved his diarrhea that he had experienced for the past three to four years.  He experienced significant stomach problems over the past few months, leading to an emergency room visit on January 24th due to extreme diarrhea, urination, and dehydration. The cause of these symptoms was not determined, but discontinuing Prilosec resolved the diarrhea.  He has been working on weight loss and reports a decrease in weight to 232 pounds, with a goal of reaching 200 pounds. He has been more active, using a stationary bike and walking more as his knee condition improves. He received gel shots in his knees at the [place], which he plans to continue every six months.  He has a history of thyroid issues and recently had his thyroid medication increased from 75 mcg to 88 mcg. He started the new dosage today. His cholesterol was noted to be 'okay, but not great,' and he plans to see a specialist later in the summer for further management.  He has a history of  cancer, which has been in remission. He anticipates two more visits with his doctors to reach the five-year mark post-treatment.       Cr 1.11 TSH 4.40 T chol 202, HDL 65, LDL 125, TG 66    Problem List 1. Atrial fibrillation, permanent  -failed multiple cardioversions, rate control strategy recommended  -CHADSVASC=3  2. HTN 3. SCC tongue  4. Venous insufficiency     ROS: All other ROS reviewed and negative. Pertinent positives noted in the HPI.     Studies Reviewed: Marland Kitchen      TTE 09/21/2021  1. Left ventricular ejection fraction, by estimation, is 65 to 70%. The  left ventricle has normal function. The left ventricle has no regional  wall motion abnormalities. There is moderate left ventricular hypertrophy.  Left ventricular diastolic  parameters are indeterminate.   2. Right ventricular systolic function is normal. The right ventricular  size is normal.   3. Left atrial size was mildly dilated.   4. The mitral valve is normal in structure. Mild mitral valve  regurgitation.   5. The aortic valve is tricuspid. Aortic valve regurgitation is mild.  Aortic valve sclerosis/calcification is present, without any evidence of  aortic stenosis.   6. Aortic dilatation noted. There is mild dilatation of the ascending  aorta, measuring 41 mm.   7. The inferior vena cava is normal in size with greater than 50%  respiratory variability, suggesting right atrial pressure of  3 mmHg.    Physical Exam:   VS:  BP 136/64 (BP Location: Left Arm, Patient Position: Sitting, Cuff Size: Normal)   Pulse (!) 54   Ht 5\' 9"  (1.753 m)   Wt 243 lb 6.4 oz (110.4 kg)   SpO2 98%   BMI 35.94 kg/m    Wt Readings from Last 3 Encounters:  05/26/23 243 lb 6.4 oz (110.4 kg)  08/12/22 257 lb 9.6 oz (116.8 kg)  03/15/22 257 lb 3.2 oz (116.7 kg)    GEN: Well nourished, well developed in no acute distress NECK: No JVD; No carotid bruits CARDIAC: irregular, no murmurs, rubs, gallops RESPIRATORY:  Clear to  auscultation without rales, wheezing or rhonchi  ABDOMEN: Soft, non-tender, non-distended EXTREMITIES:  No edema; No deformity  ASSESSMENT AND PLAN: .   Assessment and Plan    Permanent Atrial Fibrillation Chronic atrial fibrillation with bradycardia likely due to metoprolol. No significant bleeding on Eliquis. Occasional dizziness likely related to low heart rate and metoprolol use. - Reduce metoprolol to 25 mg daily. Instruct him to cut current tablets in half until new prescription is provided. - Continue Eliquis 5 mg BID. Change prescription to a 90-day supply.  Hypertension Blood pressure well controlled at 136/64 mmHg with metoprolol and Lasix.  Venous Insufficiency Chronic venous insufficiency with leg swelling. Improved swelling with reduced Lasix dose. - Continue Lasix at 10 mg daily. - Advise leg elevation to manage swelling. - Refer to lymphedema clinic in [place] for further management.  Obesity Weight at 232 lbs with a goal of 200 lbs. Motivated to continue weight loss efforts. - Encourage continued weight loss efforts and increased physical activity.              Follow-up: Return in about 1 year (around 05/25/2024).  Signed, Lenna Gilford. Flora Lipps, MD, [facility]  [facility]  [address], [address] [place], [place] [address] [phone]  10:54 AM

## 2023-05-26 ENCOUNTER — Encounter: Payer: Self-pay | Admitting: Cardiovascular Disease

## 2023-05-26 ENCOUNTER — Ambulatory Visit: Attending: Cardiovascular Disease | Admitting: Cardiovascular Disease

## 2023-05-26 VITALS — BP 136/64 | HR 54 | Ht 69.0 in | Wt 243.4 lb

## 2023-05-26 DIAGNOSIS — D6869 Other thrombophilia: Secondary | ICD-10-CM | POA: Diagnosis not present

## 2023-05-26 DIAGNOSIS — R600 Localized edema: Secondary | ICD-10-CM

## 2023-05-26 DIAGNOSIS — I4821 Permanent atrial fibrillation: Secondary | ICD-10-CM

## 2023-05-26 DIAGNOSIS — I872 Venous insufficiency (chronic) (peripheral): Secondary | ICD-10-CM | POA: Diagnosis not present

## 2023-05-26 DIAGNOSIS — I4891 Unspecified atrial fibrillation: Secondary | ICD-10-CM

## 2023-05-26 MED ORDER — METOPROLOL SUCCINATE ER 25 MG PO TB24: 25.0000 mg | ORAL_TABLET | Freq: Every day | ORAL | 3 refills | Status: AC

## 2023-05-26 MED ORDER — APIXABAN 5 MG PO TABS: 5.0000 mg | ORAL_TABLET | Freq: Two times a day (BID) | ORAL | 2 refills | Status: DC

## 2023-05-26 NOTE — Patient Instructions (Addendum)
 Medication Instructions:  - START FUROSEMIDE (LASIX) 10 MG DAILY  - START METOPROLOL SUCCINATE 25 MG DAILY    *If you need a refill on your cardiac medications before your next appointment, please call your pharmacy*   Lab Work: NONE    If you have labs (blood work) drawn today and your tests are completely normal, you will receive your results only by: MyChart Message (if you have MyChart) OR A paper copy in the mail If you have any lab test that is abnormal or we need to change your treatment, we will call you to review the results.   Testing/Procedures: NONE    Follow-Up: At [facility], you and your health needs are our priority.  As part of our continuing mission to provide you with exceptional heart care, we have created designated Provider Care Teams.  These Care Teams include your primary Cardiologist (physician) and Advanced Practice Providers (APPs -  Physician Assistants and Nurse Practitioners) who all work together to provide you with the care you need, when you need it.  We recommend signing up for the patient portal called "MyChart".  Sign up information is provided on this After Visit Summary.  MyChart is used to connect with patients for Virtual Visits (Telemedicine).  Patients are able to view lab/test results, encounter notes, upcoming appointments, etc.  Non-urgent messages can be sent to your provider as well.   To learn more about what you can do with MyChart, go to [email or web address].    Your next appointment:   1 year(s)  The format for your next appointment:   In Person  Provider:   Edd Fabian, FNP, Micah Flesher, PA-C, Marjie Skiff, PA-C, Robet Leu, PA-C, Juanda Crumble, PA-C, Joni Reining, DNP, ANP, Azalee Course, PA-C, Bernadene Person, NP, or Reather Littler, NP       Other Instructions s

## 2023-08-11 ENCOUNTER — Telehealth: Payer: Self-pay

## 2023-08-11 ENCOUNTER — Other Ambulatory Visit: Payer: Self-pay | Admitting: Cardiovascular Disease

## 2023-08-11 NOTE — Telephone Encounter (Signed)
 Left message to confirm appts for 6/9

## 2023-08-15 ENCOUNTER — Inpatient Hospital Stay: Payer: Medicare HMO | Attending: Hematology and Oncology | Admitting: Hematology and Oncology

## 2023-08-15 VITALS — BP 138/66 | HR 64 | Temp 98.0°F | Resp 17 | Wt 237.0 lb

## 2023-08-15 DIAGNOSIS — Z85810 Personal history of malignant neoplasm of tongue: Secondary | ICD-10-CM | POA: Diagnosis present

## 2023-08-15 DIAGNOSIS — C01 Malignant neoplasm of base of tongue: Secondary | ICD-10-CM

## 2023-08-15 DIAGNOSIS — Z08 Encounter for follow-up examination after completed treatment for malignant neoplasm: Secondary | ICD-10-CM | POA: Diagnosis present

## 2023-08-15 NOTE — Progress Notes (Signed)
 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  R cervical LN substantially reduced in size to 0.8cm w/ SUV 4.7 (prev 1.7cm w/ SUV 5.8).  There was no new or metastatic disease.  -Last CT soft tissue neck with contrast in June 2023 with stable posttreatment appearance of the neck, no evidence of recurrent disease. No evidence of new or progressive metastatic disease in the neck. No concerning symptoms today or physical examination findings. I instructed him to schedule a follow-up with Dr. Donalee Fruits since it is about 6 months from his last visit.   Hypothyroidism, currently managed by PCP, he is on levothyroxine  75 mcg. Return to clinic in 1 year or sooner as needed.  No orders of the defined types were placed in this encounter.  Murleen Arms, MD 6/9/20251:24 PM  CHIEF COMPLAINT:  Follow up for [facility] tongue   Oncology History  Cancer of base of tongue (HCC)  07/29/2018 Imaging   Neck ultrasound: IMPRESSION: 16 x 30 mm solid mass in the right submandibular area. This may represent a submandibular mass or enlarged lymph node. Recommend CT neck with contrast for further evaluation.   08/03/2018 Imaging   CT neck (at [facility]): IMPRESSION: 1. 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 There is a single ipsilateral malignant lymph node measuring 3 cm. 2. Nodular thickening along the anterior right sublingual gland, attention on follow-up PET.   09/04/2018 Procedure   US -guided bx of the R submandibular mass   09/04/2018 Pathology Results   Accession: [id number]  Lymph node, needle/core biopsy, right submandibular - SQUAMOUS CELL CARCINOMA. - SEE MICROSCOPIC DESCRIPTION.   09/11/2018 Imaging   PET: IMPRESSION: Hypermetabolism along the right base of tongue, corresponding to the patient's known primary oropharyngeal cancer.   Ipsilateral level 2 cervical nodal metastasis.   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   11/16/2018 -  Chemotherapy   The patient had dexamethasone  (DECADRON ) 4 MG tablet, 8 mg, Oral, Daily, 2 of 2 cycles palonosetron  (ALOXI ) injection 0.25 mg, 0.25 mg, Intravenous,  Once, 6 of 6 cycles Administration: 0.25 mg (11/16/2018), 0.25 mg (11/23/2018), 0.25 mg (11/30/2018), 0.25 mg (12/07/2018), 0.25 mg (12/14/2018), 0.25 mg (12/28/2018) CARBOplatin  (PARAPLATIN ) 200 mg in sodium chloride  0.9 % 250 mL chemo infusion, 200 mg (100 % of original dose 202.8 mg), Intravenous,  Once, 6 of 6 cycles Dose modification: 202.8 mg (original dose 202.8 mg, Cycle 1), 260 mg (original dose 202.8 mg, Cycle 6, Reason: Provider Judgment) Administration: 200 mg (11/16/2018), 200 mg (11/23/2018), 260 mg (11/30/2018), 260 mg (12/07/2018), 260 mg (12/14/2018), 260 mg (12/28/2018)  for chemotherapy treatment.      INTERVAL HISTORY:  Mr. Senn returns clinic for follow-up of  squamous cell carcinoma of the right base of the tongue s/p definitive chemoradiation.    Discussed the use of AI scribe software for clinical note transcription with the patient, who gave  verbal consent to proceed.  History of Present Illness Raider Valbuena "Neil [name]" is a 78 year old male who presents for a follow-up visit for his H and N cancer. Since his last visit here, he has been doing ok. His last visit with Dr Donalee Fruits in oct 2024, no concern for recurrence.  He experienced severe gastrointestinal distress around Thanksgiving, describing it as feeling like he was going to die. Although unable to see a doctor, he was advised to visit the emergency room if symptoms worsened, but he improved slightly on his own. He has since made significant dietary changes, including eliminating red meat and spicy foods, which have helped alleviate his symptoms. He has lost weight and feels much better overall.  In January, he nearly collapsed due to dehydration, which he attributes to his medication regimen. He stopped taking furosemide  and omeprazole without consulting a doctor. Furosemide  caused excessive urination, and omeprazole caused diarrhea. He has also stopped taking vitamins B12 and D due to stomach issues.  He experienced severe vertigo for about a month, which has since improved. He has reduced his caffeine intake and stopped consuming sugar. No difficulty swallowing, and he takes care to eat slowly and in small bites.  He is currently taking five medications, including metoprolol . He monitors his blood pressure at home and notes that his heart rate is sometimes as low as 51-57 bpm. He has been transferring his care to the [facility] for convenience and has been satisfied with the services there.   Rest of the pertinent 10 point ROS reviewed and neg.  REVIEW OF SYSTEMS:    Constitutional: ( - ) fevers, ( - )  chills , ( - ) night sweats Eyes: ( - ) blurriness of vision, ( - ) double vision, ( - ) watery eyes Ears, nose, mouth, throat, and face: ( - ) mucositis, ( - ) sore throat Respiratory: ( - ) cough, ( - ) dyspnea, ( - ) wheezes Cardiovascular: ( - ) palpitation, ( - )  chest discomfort, ( - ) lower extremity swelling Gastrointestinal:  ( - ) nausea, ( - ) heartburn, ( - ) change in bowel habits Skin: ( - ) abnormal skin rashes Lymphatics: ( - ) new lymphadenopathy, ( - ) easy bruising Neurological: ( - ) numbness, ( - ) tingling, ( - ) new weaknesses Behavioral/Psych: ( - ) mood change, ( - ) new changes  All other systems were reviewed with the patient and are negative.   I have reviewed the past medical history, past surgical history, social history and family history with the patient and they are unchanged from previous note.  ALLERGIES:  is allergic to lisinopril.  MEDICATIONS:  Current Outpatient Medications  Medication Sig Dispense Refill   apixaban  (ELIQUIS ) 5 MG TABS tablet Take 1 tablet (5 mg total) by mouth 2 (two) times daily. 60 tablet 2   Cholecalciferol (VITAMIN D-3) 25 MCG (1000 UT) CAPS Take by mouth.     FEXOFENADINE HCL PO Take by mouth as needed.     furosemide  (LASIX ) 20 MG tablet TAKE 1 TABLET BY MOUTH EVERY DAY 90 tablet 3   levothyroxine  (SYNTHROID ) 88 MCG tablet Take 1 tablet by mouth daily.     metoprolol  succinate (TOPROL  XL) 25 MG 24 hr tablet Take 1 tablet (  25 mg total) by mouth daily. 90 tablet 3   omeprazole (PRILOSEC) 40 MG capsule Take 40 mg by mouth daily. (Patient not taking: Reported on 05/26/2023)     potassium chloride  (KLOR-CON ) 10 MEQ tablet TAKE 1 TABLET BY MOUTH EVERY DAY 90 tablet 3   tamsulosin (FLOMAX) 0.4 MG CAPS capsule Take 0.4 mg by mouth daily after supper.     vitamin B-12 (CYANOCOBALAMIN) 500 MCG tablet Take 500 mcg by mouth daily.     No current facility-administered medications for this visit.    PHYSICAL EXAMINATION: ECOG PERFORMANCE STATUS: 1 - Symptomatic but completely ambulatory  Today's Vitals   08/15/23 1255 08/15/23 1256  BP: (!) 145/75 138/66  Pulse: 64   Resp: 17   Temp: 98 F (36.7 C)   TempSrc: Temporal   SpO2: 99%   Weight: 237 lb (107.5 kg)    Body mass index is 35 kg/m.   Filed Weights   08/15/23 1255  Weight: 237 lb (107.5 kg)    Physical Exam Constitutional:      Appearance: Normal appearance. He is normal weight.  HENT:     Head: Normocephalic and atraumatic.     Mouth/Throat:     Comments: No oral lesions. No tonsillar enlargement. Pulmonary:     Effort: Pulmonary effort is normal.     Breath sounds: Normal breath sounds.  Musculoskeletal:        General: Swelling (Chronic venous stasis changes and 1+ to 2+ bilateral lower extremity swelling) present.     Cervical back: No rigidity or tenderness.  Lymphadenopathy:     Cervical: No cervical adenopathy.  Neurological:     Mental Status: He is alert.      LABORATORY DATA:  I have reviewed the data as listed    Component Value Date/Time   NA 143 03/15/2022 1630   K 3.7 03/15/2022 1630   CL 107 (H) 03/15/2022 1630   CO2 21 03/15/2022 1630   GLUCOSE 86 03/15/2022 1630   GLUCOSE 92 08/25/2020 1524   BUN 17 03/15/2022 1630   CREATININE 1.26 03/15/2022 1630   CREATININE 1.05 08/25/2020 1524   CALCIUM 9.0 03/15/2022 1630   PROT 6.9 08/25/2020 1524   ALBUMIN 4.1 08/25/2020 1524   AST 20 08/25/2020 1524   ALT 11 08/25/2020 1524   ALKPHOS 27 (L) 08/25/2020 1524   BILITOT 0.8 08/25/2020 1524   GFRNONAA >60 08/25/2020 1524   GFRAA >60 09/27/2019 1103    No results found for: "SPEP", "UPEP"  Lab Results  Component Value Date   WBC 4.2 08/25/2020   NEUTROABS 2.7 08/25/2020   HGB 15.2 08/25/2020   HCT 43.8 08/25/2020   MCV 88.0 08/25/2020   PLT 148 (L) 08/25/2020      Chemistry      Component Value Date/Time   NA 143 03/15/2022 1630   K 3.7 03/15/2022 1630   CL 107 (H) 03/15/2022 1630   CO2 21 03/15/2022 1630   BUN 17 03/15/2022 1630   CREATININE 1.26 03/15/2022 1630   CREATININE 1.05 08/25/2020 1524      Component Value Date/Time   CALCIUM 9.0 03/15/2022 1630   ALKPHOS 27 (L) 08/25/2020 1524   AST 20 08/25/2020 1524   ALT 11 08/25/2020 1524   BILITOT 0.8 08/25/2020  1524      RADIOGRAPHIC STUDIES: I have personally reviewed the radiological images as listed below and agreed with the findings in the report. No results found.   Murleen Arms MD

## 2023-09-03 ENCOUNTER — Other Ambulatory Visit: Payer: Self-pay | Admitting: Cardiovascular Disease

## 2023-09-03 DIAGNOSIS — I4891 Unspecified atrial fibrillation: Secondary | ICD-10-CM

## 2023-09-05 NOTE — Telephone Encounter (Signed)
 Prescription refill request for Eliquis  received. Indication:afib Last office visit:3/25 Scr:1.08  9/24 Age: 78 Weight:107.5  kg  Prescription refilled

## 2023-09-27 ENCOUNTER — Ambulatory Visit: Admitting: Rehabilitation

## 2023-11-27 ENCOUNTER — Other Ambulatory Visit: Payer: Self-pay | Admitting: Cardiovascular Disease

## 2023-11-27 DIAGNOSIS — I4891 Unspecified atrial fibrillation: Secondary | ICD-10-CM

## 2023-11-28 NOTE — Telephone Encounter (Signed)
 Prescription refill request for Eliquis  received. Indication: AF Last office visit: 05/26/23  LELON Decent MD Scr: 1.03 on 11/21/23  Epic Age: 78 Weight: 110.4kg  Based on above findings Eliquis  5mg  twice daily is the appropriate dose.  Refill approved.
